# Patient Record
Sex: Male | Born: 1975 | Race: White | Marital: Single | Smoking: Former smoker
Health system: Northeastern US, Academic
[De-identification: ages and names within clinical notes are randomized; demographics above are authoritative.]

## PROBLEM LIST (undated history)

## (undated) DIAGNOSIS — I10 Essential (primary) hypertension: Secondary | ICD-10-CM

## (undated) DIAGNOSIS — E669 Obesity, unspecified: Secondary | ICD-10-CM

## (undated) DIAGNOSIS — F99 Mental disorder, not otherwise specified: Secondary | ICD-10-CM

## (undated) DIAGNOSIS — I2510 Atherosclerotic heart disease of native coronary artery without angina pectoris: Secondary | ICD-10-CM

## (undated) HISTORY — PX: CHOLECYSTECTOMY: SHX55

## (undated) HISTORY — PX: HAND TENDON SURGERY: SHX663

## (undated) HISTORY — PX: OTHER SURGICAL HISTORY: SHX169

---

## 2004-12-13 ENCOUNTER — Emergency Department (HOSPITAL_COMMUNITY): Admission: EM | Admit: 2004-12-13 | Discharge: 2004-12-13 | Payer: Self-pay | Admitting: Emergency Medicine

## 2006-09-22 ENCOUNTER — Encounter: Payer: Self-pay | Admitting: Cardiology

## 2011-10-26 ENCOUNTER — Other Ambulatory Visit: Payer: Self-pay | Admitting: Gastroenterology

## 2011-10-26 ENCOUNTER — Encounter: Payer: Self-pay | Admitting: Emergency Medicine

## 2011-10-26 ENCOUNTER — Inpatient Hospital Stay
Admission: EM | Admit: 2011-10-26 | Disposition: A | Discharge status: Home / Self Care | Payer: Self-pay | Source: Ambulatory Visit | Admitting: Emergency Medicine

## 2011-10-26 HISTORY — DX: Obesity, unspecified: E66.9

## 2011-10-26 HISTORY — DX: Mental disorder, not otherwise specified: F99

## 2011-10-26 HISTORY — DX: Atherosclerotic heart disease of native coronary artery without angina pectoris: I25.10

## 2011-10-26 HISTORY — DX: Essential (primary) hypertension: I10

## 2011-10-26 LAB — HOLD BLUE

## 2011-10-26 LAB — TROPONIN T: Troponin T: <0.01 ng/mL (ref 0.00–0.02)

## 2011-10-26 LAB — CK ISOENZYMES
CK: 509 U/L — ABNORMAL HIGH (ref 46–171)
Mass CKMB: 9 ng/mL — ABNORMAL HIGH (ref 0.0–4.9)
Relative Index: 1.8 % (ref 0.0–5.0)

## 2011-10-26 LAB — CBC AND DIFFERENTIAL
Baso # K/uL: 0 10*3/uL (ref 0.0–0.1)
Basophil %: 0.2 % (ref 0.2–1.2)
Eos # K/uL: 0 10*3/uL (ref 0.0–0.5)
Eosinophil %: 0.1 % — ABNORMAL LOW (ref 0.8–7.0)
Hematocrit: 47 % (ref 40–51)
Hemoglobin: 17.1 g/dL (ref 13.7–17.5)
Lymph # K/uL: 1.7 10*3/uL (ref 1.3–3.6)
Lymphocyte %: 9.2 % — ABNORMAL LOW (ref 21.8–53.1)
MCV: 87 fL (ref 79–92)
Mono # K/uL: 0.9 10*3/uL — ABNORMAL HIGH (ref 0.3–0.8)
Monocyte %: 4.9 % — ABNORMAL LOW (ref 5.3–12.2)
Neut # K/uL: 16 10*3/uL — ABNORMAL HIGH (ref 1.8–5.4)
Platelets: 205 10*3/uL (ref 150–330)
RBC: 5.4 MIL/uL (ref 4.6–6.1)
RDW: 13.1 % (ref 11.6–14.4)
Seg Neut %: 85.6 % — ABNORMAL HIGH (ref 34.0–67.9)
WBC: 18.7 10*3/uL — ABNORMAL HIGH (ref 4.2–9.1)

## 2011-10-26 LAB — BASIC METABOLIC PANEL
Anion Gap: 17 — ABNORMAL HIGH (ref 7–16)
CO2: 22 mmol/L (ref 20–28)
Calcium: 9.6 mg/dL (ref 9.0–10.3)
Chloride: 100 mmol/L (ref 96–108)
Creatinine: 0.91 mg/dL (ref 0.67–1.17)
GFR,Black: 124 *
GFR,Caucasian: 107 *
Glucose: 105 mg/dL — ABNORMAL HIGH (ref 60–99)
Lab: 16 mg/dL (ref 6–20)
Potassium: 3 mmol/L — ABNORMAL LOW (ref 3.3–5.1)
Sodium: 139 mmol/L (ref 133–145)

## 2011-10-26 LAB — BLOOD BANK HOLD RED

## 2011-10-26 LAB — HOLD GRAY

## 2011-10-26 LAB — BLOOD BANK HOLD LAVENDER

## 2011-10-26 MED ORDER — MORPHINE SULFATE 4 MG/ML IJ SOLN
4.0000 mg | INTRAMUSCULAR | Status: DC | PRN
Start: 2011-10-26 — End: 2011-10-29
  Administered 2011-10-26 – 2011-10-28 (×8): 4 mg via INTRAVENOUS
  Filled 2011-10-26 (×9): qty 1

## 2011-10-26 MED ORDER — POTASSIUM BICARBONATE 25 MEQ PO TBEF *I*
50.0000 meq | EFFERVESCENT_TABLET | Freq: Once | ORAL | Status: AC
Start: 2011-10-27 — End: 2011-10-26
  Administered 2011-10-26: 50 meq via ORAL
  Filled 2011-10-26: qty 2

## 2011-10-26 MED ORDER — ONDANSETRON HCL 2 MG/ML IV SOLN *I*
4.0000 mg | Freq: Four times a day (QID) | INTRAMUSCULAR | Status: AC | PRN
Start: 2011-10-26 — End: 2011-10-27
  Administered 2011-10-26 – 2011-10-27 (×3): 4 mg via INTRAVENOUS
  Filled 2011-10-26 (×3): qty 2

## 2011-10-26 MED ORDER — SODIUM CHLORIDE 0.9 % IV BOLUS *I*
1000.0000 mL | Freq: Once | Status: AC
Start: 2011-10-26 — End: 2011-10-27
  Administered 2011-10-26 – 2011-10-27 (×2): 1000 mL via INTRAVENOUS

## 2011-10-26 MED ORDER — SODIUM CHLORIDE 0.9 % IV SOLN WRAPPED *I*
100.0000 mL/h | Status: DC
Start: 2011-10-26 — End: 2011-10-27
  Administered 2011-10-27 (×6): 100 mL/h via INTRAVENOUS

## 2011-10-26 NOTE — ED Notes (Signed)
NP Weber notified of elevated CK and decreased potassium level. Also notified of pt's request for pain medicine.   August Saucer, RN

## 2011-10-26 NOTE — ED Notes (Signed)
Pt came in via EMS, EMS reports pt having seizure for 2 min (tonic clonic movement, post ictal in ambulance. Pt is currently having chest pain 10/10 in center of chest that feels tight. EMS gave 4 aspirin and zofran prior to arrival.  Pt has hx of MI. Pt denies SOB. Pt is is nauseous and diaphoretic.  Pt reports mouth feeling numb and tingling. EMS believes that during seizure he bit his tongue, head is pounding. Pt reports not taking medication today because he "wasnt feeling well all day" (medication in bag at bedside.)  August Saucer, RN

## 2011-10-26 NOTE — ED Notes (Signed)
NP at bedside  Jetson Pickrel C Fabian Coca, RN

## 2011-10-26 NOTE — ED Notes (Signed)
C/o swollen tongue. Had seizure, may have bit tongue. Now feels short of breath, and chest pain.

## 2011-10-26 NOTE — ED Notes (Signed)
In addition to chest pain, head ache and lower abdominal tenderness, Pt now reports new onset of shooting pain up left leg. Will continue to monitor and await provider orders.  August Saucer, RN

## 2011-10-26 NOTE — ED Notes (Signed)
 Labs reviewed.  August Saucer, RN

## 2011-10-26 NOTE — ED Provider Notes (Addendum)
History     Chief Complaint   Patient presents with   . Seizures   . Chest Pain     HPI Comments: Patient with PMH of CAD/MI, HTN, obesity, and very poor historian, was brought into ED tonight after he got up, went into bathroom, witness states she fell backward, then started to shake all over, then was able to get up, and walk into living room, was complaining of chest pain.  Patient does not have any recall of events since he stood up to go to the bathroom. Patient states he has a headache now, body hurts all over, still having chest pain.  No nausea/vomiting, denies recent illness, no recent fevers.    The history is provided by the patient, a relative and a parent. No language interpreter was used.       Past Medical History   Diagnosis Date   . Coronary artery disease    . Obesity    . Psychiatric disorder    . Hypertension             History reviewed. No pertinent past surgical history.    History reviewed. No pertinent family history.      Social History      reports that he has quit smoking. His smoking use included Cigarettes. He has never used smokeless tobacco. He reports that he does not drink alcohol or use illicit drugs. His sexual activity history not on file.    Living Situation     Questions Responses    Patient lives with Alone    Homeless No    Caregiver for other family member No    External Services None    Employment     Domestic Violence Risk No          Review of Systems   Review of Systems   Constitutional: Positive for activity change. Negative for fever, chills, diaphoresis, appetite change, fatigue and unexpected weight change.        Patient states he did not take his regular medications today because he did not feel well  Headache, body aches, chest pain   HENT: Positive for mouth sores. Negative for hearing loss, ear pain, nosebleeds, congestion, sore throat, facial swelling, rhinorrhea, sneezing, drooling, trouble swallowing, neck pain, neck stiffness, dental problem, voice  change, postnasal drip, sinus pressure, tinnitus and ear discharge.         Reports tongue feels a little sore/ swollen   Eyes: Negative.  Negative for photophobia, pain, discharge, redness, itching and visual disturbance.   Respiratory: Positive for shortness of breath. Negative for cough, choking, chest tightness, wheezing and stridor.    Cardiovascular: Positive for chest pain. Negative for palpitations and leg swelling.        Reports mid-sternal CP   Gastrointestinal: Negative.  Negative for nausea, vomiting, abdominal pain, diarrhea and abdominal distention.   Genitourinary: Negative.  Negative for dysuria, urgency, frequency, hematuria, flank pain, decreased urine volume and difficulty urinating.   Musculoskeletal: Positive for myalgias. Negative for back pain, joint swelling, arthralgias and gait problem.        Generalized body soreness/aches   Skin: Negative.    Neurological: Positive for dizziness, seizures, syncope, light-headedness and headaches. Negative for tremors, facial asymmetry, speech difficulty, weakness and numbness.        Had feeling of dizziness before falling  Niece reports patient shaking all over for about 3 minutes, ambulance report patient was post ictal upon their arrival  Patient does not  recall events excepts dizziness after standing up to go to bathroom   Hematological: Negative.  Negative for adenopathy. Does not bruise/bleed easily.   Psychiatric/Behavioral: Negative.        Physical Exam     ED Triage Vitals   BP Heart Rate Resp Temp Temp Source SpO2 O2 Device O2 Flow Rate weight   10/26/11 2116 10/26/11 2116 10/26/11 2116 10/26/11 2116 10/26/11 2116 10/26/11 2116 10/26/11 2116 -- 10/26/11 2116   141/81 mmHg 113  18  35.8 C (96.4 F) TEMPORAL 94 % None (Room air)  111.131 kg (245 lb)       Physical Exam   Nursing note and vitals reviewed.  Constitutional: He is oriented to person, place, and time. He appears well-developed and well-nourished. No distress.        Poor historian    HENT:   Head: Normocephalic.   Right Ear: External ear normal.   Left Ear: External ear normal.   Nose: Nose normal.        Superficial bite marks on dorsal mid tongue, non-suturable  No other oral trauma  No C-spine palpation tenderness  Full ROM of neck without pain  Mild area of swelling/scalp contusion Left posterior area          Eyes: Conjunctivae and EOM are normal. Pupils are equal, round, and reactive to light. Right eye exhibits no discharge. Left eye exhibits no discharge. No scleral icterus.   Neck: Normal range of motion. Neck supple.   Cardiovascular: Normal rate, regular rhythm, normal heart sounds and intact distal pulses.  Exam reveals no gallop and no friction rub.    No murmur heard.  Pulmonary/Chest: Effort normal. No stridor. No respiratory distress. He has no wheezes. He has no rales. He exhibits tenderness.        Coarse breath sounds, scattered  + palpation tenderness Left chest wall area   Abdominal: Soft. Bowel sounds are normal. He exhibits distension. He exhibits no mass. There is no tenderness. There is no rebound and no guarding.        Very large, rounded, semi-soft, non-tender to palpation  No CVA tenderness   Musculoskeletal: Normal range of motion. He exhibits no edema and no tenderness.        No edema  2+ BP/PT pulses BLE  No calf palpation tenderness BLE  5+ motor all extremities  Normal sensation all extremities   Lymphadenopathy:     He has no cervical adenopathy.   Neurological: He is alert and oriented to person, place, and time. No cranial nerve deficit. Coordination normal.   Skin: Skin is warm and dry. No rash noted. He is not diaphoretic. No erythema. No pallor.   Psychiatric: He has a normal mood and affect. His behavior is normal. Judgment and thought content normal.       Medical Decision Making      Amount and/or Complexity of Data Reviewed  Clinical lab tests: ordered and reviewed  Tests in the radiology section of CPT: reviewed and ordered  Tests in the medicine  section of CPT: ordered and reviewed  Obtain history from someone other than the patient: yes (Family members)  Review and summarize past medical records: yes  Discuss the patient with other providers: yes  Independent visualization of images, tracings, or specimens: yes        Initial Evaluation:  ED First Provider Contact     Date/Time Event User Comments    10/26/11 2253 ED Provider First Contact [place], [facility]  ANN Initial Face to Face Provider Contact          Patient seen by me today 10/26/2011 at 2300    Assessment:  36 y.o., male with PMH of CAD/MI, HTN, GERD comes to the ED with syncope, tonight, seizure activity, now HA, CP, generalized body aches.  Patient states he had not felt well all day today, has not taken his regular medications.    Differential Diagnosis includes ACS, angina, dysrhythmia, electrolyte imbalance, micturition syncope, vagal response, dehydration, infection, musculoskeletal pain    Plan: monitor, serial troponin's, EKG's          CT head          Check labs          Anticipate admission    Supervising physician Dr Jenene Slicker  was immediately available.         MARY ANN Valarie Cones, NP    Sharon Mt, NP  10/27/11 0049    Sharon Mt, NP  10/27/11 0159      APP Review:     I had face-to-face interaction with the patient 10/27/2011 at 0325.      I was asked by Valarie Cones NP/PA to see this patient due to the complexity of the current medical presentation and due to diagnostic uncertainty.    I have reviewed and agree with the above documentation and, in addition, the history is notable for poor po intake over the past few days; syncope vs seizure due to vagal event.         Author Noah Delaine, MD      Noah Delaine, MD  10/28/11 [phone]

## 2011-10-27 ENCOUNTER — Encounter: Payer: Self-pay | Admitting: Emergency Medicine

## 2011-10-27 DIAGNOSIS — I2510 Atherosclerotic heart disease of native coronary artery without angina pectoris: Secondary | ICD-10-CM | POA: Insufficient documentation

## 2011-10-27 DIAGNOSIS — I10 Essential (primary) hypertension: Secondary | ICD-10-CM | POA: Insufficient documentation

## 2011-10-27 DIAGNOSIS — E669 Obesity, unspecified: Secondary | ICD-10-CM | POA: Insufficient documentation

## 2011-10-27 DIAGNOSIS — F99 Mental disorder, not otherwise specified: Secondary | ICD-10-CM | POA: Insufficient documentation

## 2011-10-27 LAB — PLASMA PROF 7 (ED ONLY)
Anion Gap,PL: 10 (ref 7–16)
CO2,Plasma: 28 mmol/L (ref 20–28)
Chloride,Plasma: 100 mmol/L (ref 96–108)
Creatinine: 0.8 mg/dL (ref 0.67–1.17)
GFR,Black: 132 *
GFR,Caucasian: 114 *
Glucose,Plasma: 88 mg/dL (ref 60–99)
Potassium,Plasma: 2.8 mmol/L — CL (ref 3.4–4.7)
Sodium,Plasma: 138 mmol/L (ref 132–146)
UN,Plasma: 10 mg/dL (ref 6–20)

## 2011-10-27 LAB — URINALYSIS WITH MICROSCOPIC
Blood,UA: NEGATIVE
Ketones, UA: NEGATIVE
Leuk Esterase,UA: NEGATIVE
Nitrite,UA: NEGATIVE
Protein,UA: NEGATIVE mg/dL
RBC,UA: NONE SEEN /hpf (ref 0–2)
Specific Gravity,UA: 1.015 (ref 1.002–1.030)
WBC,UA: <1 /hpf (ref 0–5)
pH,UA: 6 (ref 5.0–8.0)

## 2011-10-27 LAB — BASIC METABOLIC PANEL
Anion Gap: 9 (ref 7–16)
CO2: 28 mmol/L (ref 20–28)
Calcium: 8.6 mg/dL — ABNORMAL LOW (ref 9.0–10.3)
Chloride: 102 mmol/L (ref 96–108)
Creatinine: 0.94 mg/dL (ref 0.67–1.17)
GFR,Black: 119 *
GFR,Caucasian: 103 *
Glucose: 98 mg/dL (ref 60–99)
Lab: 17 mg/dL (ref 6–20)
Potassium: 2.9 mmol/L — ABNORMAL LOW (ref 3.3–5.1)
Sodium: 139 mmol/L (ref 133–145)

## 2011-10-27 LAB — CK ISOENZYMES
CK: 1898 U/L — ABNORMAL HIGH (ref 46–171)
CK: 3073 U/L — ABNORMAL HIGH (ref 46–171)
CK: 3285 U/L — ABNORMAL HIGH (ref 46–171)
Mass CKMB: 14.2 ng/mL — ABNORMAL HIGH (ref 0.0–4.9)
Mass CKMB: 12 ng/mL — ABNORMAL HIGH (ref 0.0–4.9)
Mass CKMB: 10 ng/mL — ABNORMAL HIGH (ref 0.0–4.9)
Relative Index: 0.7 % (ref 0.0–5.0)
Relative Index: 0.4 % (ref 0.0–5.0)
Relative Index: 0.3 % (ref 0.0–5.0)

## 2011-10-27 LAB — CBC AND DIFFERENTIAL
Baso # K/uL: 0 10*3/uL (ref 0.0–0.1)
Basophil %: 0.2 % (ref 0.2–1.2)
Eos # K/uL: 0.1 10*3/uL (ref 0.0–0.5)
Eosinophil %: 0.5 % — ABNORMAL LOW (ref 0.8–7.0)
Hematocrit: 40 % (ref 40–51)
Hemoglobin: 14.7 g/dL (ref 13.7–17.5)
Lymph # K/uL: 2.5 10*3/uL (ref 1.3–3.6)
Lymphocyte %: 19.1 % — ABNORMAL LOW (ref 21.8–53.1)
MCV: 87 fL (ref 79–92)
Mono # K/uL: 1 10*3/uL — ABNORMAL HIGH (ref 0.3–0.8)
Monocyte %: 7.5 % (ref 5.3–12.2)
Neut # K/uL: 9.5 10*3/uL — ABNORMAL HIGH (ref 1.8–5.4)
Platelets: 170 10*3/uL (ref 150–330)
RBC: 4.6 MIL/uL (ref 4.6–6.1)
RDW: 13.1 % (ref 11.6–14.4)
Seg Neut %: 72.7 % — ABNORMAL HIGH (ref 34.0–67.9)
WBC: 13 10*3/uL — ABNORMAL HIGH (ref 4.2–9.1)

## 2011-10-27 LAB — TROPONIN T
Troponin T: <0.01 ng/mL (ref 0.00–0.02)
Troponin T: <0.01 ng/mL (ref 0.00–0.02)

## 2011-10-27 LAB — DRUG SCREEN CHEMICAL DEPENDENCY, URINE
Amphetamine,UR: NEGATIVE
Benzodiazepinen,UR: NEGATIVE
Cocaine/Metab,UR: NEGATIVE
Opiates,UR: POSITIVE
THC Metabolite,UR: NEGATIVE

## 2011-10-27 MED ORDER — PANTOPRAZOLE SODIUM 40 MG PO TBEC *I*
40.0000 mg | DELAYED_RELEASE_TABLET | Freq: Two times a day (BID) | ORAL | Status: DC
Start: 2011-10-27 — End: 2011-10-29
  Administered 2011-10-27 – 2011-10-29 (×4): 40 mg via ORAL
  Filled 2011-10-27 (×6): qty 1

## 2011-10-27 MED ORDER — ACETAMINOPHEN 500 MG PO TABS *I*
ORAL_TABLET | ORAL | Status: AC
Start: 2011-10-27 — End: 2011-10-27
  Administered 2011-10-27: 1000 mg via ORAL
  Filled 2011-10-27: qty 2

## 2011-10-27 MED ORDER — KETOROLAC TROMETHAMINE 30 MG/ML IJ SOLN *I*
30.0000 mg | Freq: Once | INTRAMUSCULAR | Status: AC
Start: 2011-10-27 — End: 2011-10-27
  Administered 2011-10-27: 30 mg via INTRAVENOUS
  Filled 2011-10-27: qty 1

## 2011-10-27 MED ORDER — POTASSIUM CHLORIDE CRYS CR 20 MEQ PO TBCR *I*
40.0000 meq | ORAL_TABLET | Freq: Once | ORAL | Status: AC
Start: 2011-10-27 — End: 2011-10-27
  Administered 2011-10-27: 40 meq via ORAL
  Filled 2011-10-27: qty 2

## 2011-10-27 MED ORDER — POTASSIUM BICARBONATE 25 MEQ PO TBEF *I*
50.0000 meq | EFFERVESCENT_TABLET | Freq: Once | ORAL | Status: AC
Start: 2011-10-27 — End: 2011-10-27
  Administered 2011-10-27: 50 meq via ORAL
  Filled 2011-10-27: qty 2

## 2011-10-27 MED ORDER — METOPROLOL SUCCINATE 50 MG PO TB24 *I*
50.0000 mg | ORAL_TABLET | Freq: Every day | ORAL | Status: DC
Start: 2011-10-27 — End: 2011-10-29
  Administered 2011-10-27 – 2011-10-29 (×3): 50 mg via ORAL
  Filled 2011-10-27 (×3): qty 1

## 2011-10-27 MED ORDER — CALCIUM CARBONATE ANTACID 648 MG PO TABS *I*
1300.0000 mg | ORAL_TABLET | Freq: Two times a day (BID) | ORAL | Status: DC
Start: 2011-10-27 — End: 2011-10-29
  Administered 2011-10-27 – 2011-10-29 (×3): 1300 mg via ORAL
  Filled 2011-10-27 (×6): qty 2

## 2011-10-27 MED ORDER — POTASSIUM BICARBONATE 25 MEQ PO TBEF *I*
25.0000 meq | EFFERVESCENT_TABLET | Freq: Once | ORAL | Status: AC
Start: 2011-10-27 — End: 2011-10-27
  Administered 2011-10-27: 25 meq via ORAL
  Filled 2011-10-27: qty 1

## 2011-10-27 MED ORDER — SODIUM CHLORIDE 0.9 % IV SOLN WRAPPED *I*
150.0000 mL/h | Status: DC
Start: 2011-10-27 — End: 2011-10-28
  Administered 2011-10-27 – 2011-10-28 (×5): 150 mL/h via INTRAVENOUS

## 2011-10-27 MED ORDER — CHLORTHALIDONE 50 MG PO TABS *I*
25.0000 mg | ORAL_TABLET | Freq: Every morning | ORAL | Status: DC
Start: 2011-10-28 — End: 2011-10-29
  Administered 2011-10-28 – 2011-10-29 (×2): 25 mg via ORAL
  Filled 2011-10-27 (×3): qty 1

## 2011-10-27 MED ORDER — ACETAMINOPHEN 500 MG PO TABS *I*
1000.0000 mg | ORAL_TABLET | Freq: Once | ORAL | Status: AC
Start: 2011-10-27 — End: 2011-10-27

## 2011-10-27 MED ORDER — SERTRALINE HCL 50 MG PO TABS *I*
200.0000 mg | ORAL_TABLET | Freq: Every day | ORAL | Status: DC
Start: 2011-10-27 — End: 2011-10-29
  Administered 2011-10-27 – 2011-10-29 (×3): 200 mg via ORAL
  Filled 2011-10-27 (×3): qty 4

## 2011-10-27 NOTE — ED Notes (Signed)
Pt resting comfortably, watching tv. Will continue to monitor and tx per orders.

## 2011-10-27 NOTE — ED Notes (Signed)
Labs reviewed. Tilda Franco NP paged about elevated CK. Awaiting providers orders. Will continue to monitor.  August Saucer, RN

## 2011-10-27 NOTE — ED Notes (Signed)
ED RN INTERN ATTESTATION       I Aurelio Brash, RN (RN) reviewed the following charting information by the RN intern: SD    Nursing Assessments  Medications  Plan of Care  Teaching   Notes    In the chart of Robert Valdez (36 y.o. male) and attest to the charting being accurate.

## 2011-10-27 NOTE — ED Notes (Signed)
Pt reports tongue feeling more numb. Will continue to monitor.  August Saucer, RN

## 2011-10-27 NOTE — ED Notes (Addendum)
ED RN INTERN ATTESTATION       I Isaiah Serge, RN (RN) reviewed the following charting information by the RN intern:Tayler Moonan    Nursing Assessments  Medications  Plan of Care  Teaching   Notes    In the chart of Robert Valdez (36 y.o. male) and attest to the charting being accurate.

## 2011-10-27 NOTE — Progress Notes (Signed)
Utilization Management    Level of Care Inpatient as of the date 10/26/11      Dutch Quint, RN     Pager: [phone]

## 2011-10-27 NOTE — ED Notes (Signed)
Tilda Franco NP made aware of CK. Will await provider orders.  August Saucer, RN

## 2011-10-27 NOTE — ED Notes (Signed)
Pt resting comfortably,reports pain has decreased from a 10/10 to 6/10. Will continue to monitor and tx per orders

## 2011-10-27 NOTE — ED Notes (Signed)
Patient is a+ox4 using urinal appropriately and tolerating PO intake. He still has some chest pain that he states comes and goes. He is relaxing in bed watching tv, will continue to monitor.

## 2011-10-27 NOTE — ED Notes (Signed)
Spoke with provider about potassium. Will medicate per orders.

## 2011-10-27 NOTE — ED Notes (Signed)
Labs and chart reviewed, K noted at 2.9, provider advised.

## 2011-10-27 NOTE — ED Notes (Signed)
Labs reviewed. Provider paged about potassium level. Will continue to monitor pt.     August Saucer, RN

## 2011-10-27 NOTE — ED Notes (Signed)
POTASSIUM REPORTED TO BE 2.8 BY CHEMISTRY, REPORTED TO KATIE, RN

## 2011-10-27 NOTE — ED Notes (Signed)
Received report, assumed care of pt. Chart and labs review.

## 2011-10-27 NOTE — ED Notes (Signed)
Pt given pain medicine per request for tongue pain. Pt refused PO potassium supplement at this time due to pain. Weber NP notified of pt's tongue pain due to bruising from bite yesterday. NP aware and pt notified of plan to administer IV potassium if he is not able to drink potassium supplement. Will continue to monitor and offer PO potassium again after reassessing pain medication for effectiveness.   August Saucer, RN

## 2011-10-27 NOTE — ED Notes (Signed)
ED RN INTERN ATTESTATION       I Isaiah Serge, RN (RN) reviewed the following charting information by the RN intern:JL    Nursing Assessments  Medications  Plan of Care  Teaching   Notes    In the chart of Robert Valdez (36 y.o. male) and attest to the charting being accurate.

## 2011-10-27 NOTE — ED Notes (Signed)
Pt off unit to CT scan

## 2011-10-27 NOTE — ED Notes (Signed)
Pt sleeping. RR 20. Will check back and continue to monitor for pain and nausea.  August Saucer, RN

## 2011-10-27 NOTE — ED Notes (Signed)
Provided pt with sandwich, jello and ginger ale. Pt tolerating PO well.

## 2011-10-27 NOTE — ED Notes (Signed)
Pt requesting jello, graham crackers, and giner ale. Tolerating well. Will continue to monitor.

## 2011-10-27 NOTE — ED Notes (Signed)
Observation care initiated

## 2011-10-27 NOTE — ED Obs Notes (Signed)
ED OBSERVATION ADMISSION NOTE    Patient seen by me today, 10/27/2011 at 3:58 PM    Current patient status: Inpatient    History     Chief Complaint   Patient presents with   . Seizures   . Chest Pain     HPI Comments: Robert Valdez is a 36 year old male PMH CAD HTN obesity and unknown psychiatric disorder who is placed in OBS after he presented to the ED c/o a swollen tongue after a presumed seizure. Pt is somewhat of a poor/vague historian, but reports HPI as documented below.     Per pt, went to bathroom ~2000 last night. States after he finished, he turned off the light and opened the door, then fell backwards, hitting his head on the bathroom wall. Pt states he felt dizzy, so he sat on the floor for a minute to "compose" himself, but when he got up he continued to feel dizzy, so he sat down on the couch. Pt reports he has no memory of the events that occurred next.    Per patient's report of sister's account, sister had "like a 5 minute conversation" with patient, and gave him water to drink, which he reportedly did.     Pt reports no memory of this, reports he woke up on the couch with EMS standing all around him.     Upon further questioning, pt endorses lightheaded and dizziness prior to fall. Pt currently c/o pain in stomach, HA, tongue pain and swelling. Pt reports L sided flank and abdominal pain x 2 weeks with associated diarrhea, occasionally blood tinged. Pt reports seeing his doctor for this, states he had a CT scan and bloodwork scheduled for today to r/o diverticulosis. Pt also reports intermittent chestpain x 6 weeks-also being worked up by his doctor.     The history is provided by the patient and medical records. No language interpreter was used.       Past Medical History   Diagnosis Date   . Coronary artery disease    . Obesity    . Psychiatric disorder    . Hypertension        Past Surgical History   Procedure Date   . Cholecystectomy    . Wrist surgery        History reviewed. No pertinent  family history.    Social History      reports that he quit smoking about 2 weeks ago. His smoking use included Cigarettes. He has never used smokeless tobacco. He reports that he does not drink alcohol or use illicit drugs. His sexual activity history not on file.    Living Situation     Questions Responses    Patient lives with Other(comment)    Comment:  sister in law and niece     Homeless No    Caregiver for other family member No    External Services None    Employment Unemployed    Domestic Violence Risk No          Review of Systems   Review of Systems   Constitutional: Positive for activity change and fatigue. Negative for fever, chills and appetite change.   HENT: Positive for mouth sores. Negative for hearing loss, ear pain, sore throat, neck pain and neck stiffness.    Eyes: Negative for pain and redness.   Respiratory: Negative for chest tightness and shortness of breath.    Cardiovascular: Positive for chest pain. Negative for palpitations.   Gastrointestinal:  Positive for nausea, abdominal pain, diarrhea and blood in stool. Negative for vomiting and abdominal distention.   Genitourinary: Positive for flank pain. Negative for dysuria and decreased urine volume.   Musculoskeletal: Positive for myalgias and back pain.   Skin: Negative for color change and wound.   Neurological: Positive for syncope, weakness, light-headedness and headaches.   Hematological: Negative for adenopathy. Does not bruise/bleed easily.   Psychiatric/Behavioral: Negative for behavioral problems and agitation.       Physical Exam   BP 118/61  Pulse 79  Temp(Src) 37 C (98.6 F) (Temporal)  Resp 15  Ht 1.88 m (6\' 2" )  Wt 111.131 kg (245 lb)  BMI 31.46 kg/m2  SpO2 100%    Physical Exam   Constitutional: He is oriented to person, place, and time. He appears well-developed and well-nourished. No distress.   HENT:   Head: Normocephalic and atraumatic.   Right Ear: External ear normal.   Left Ear: External ear normal.   Nose:  Nose normal.        Pt tongue appears discolored with superficial abrasion/bite marks noted   Eyes: Conjunctivae and EOM are normal.   Neck: Normal range of motion.   Cardiovascular: Normal rate, regular rhythm and normal heart sounds.    Pulmonary/Chest: Effort normal and breath sounds normal. No respiratory distress.   Abdominal: Soft. He exhibits no distension.   Musculoskeletal: Normal range of motion.   Neurological: He is alert and oriented to person, place, and time.   Skin: Skin is warm and dry. He is not diaphoretic.   Psychiatric: He has a normal mood and affect. His behavior is normal. Judgment and thought content normal.        Odd affect       Tests    ZOX:WRUE found in chart    Labs:   All labs in the last 24 hours   Recent Results (from the past 24 hour(s))   CBC AND DIFFERENTIAL    Collection Time    10/26/11 10:57 PM       Component Value Range    WBC 18.7 (*) 4.2 - 9.1 THOU/uL    RBC 5.4  4.6 - 6.1 MIL/uL    Hemoglobin 17.1  13.7 - 17.5 g/dL    Hematocrit 47  40 - 51 %    MCV 87  79 - 92 fL    RDW 13.1  11.6 - 14.4 %    Platelets 205  150 - 330 THOU/uL    Seg Neut % 85.6 (*) 34.0 - 67.9 %    Lymphocyte % 9.2 (*) 21.8 - 53.1 %    Monocyte % 4.9 (*) 5.3 - 12.2 %    Eosinophil % 0.1 (*) 0.8 - 7.0 %    Basophil % 0.2  0.2 - 1.2 %    Neut # K/uL 16.0 (*) 1.8 - 5.4 THOU/uL    Lymph # K/uL 1.7  1.3 - 3.6 THOU/uL    Mono # K/uL 0.9 (*) 0.3 - 0.8 THOU/uL    Eos # K/uL 0.0  0.0 - 0.5 THOU/uL    Baso # K/uL 0.0  0.0 - 0.1 THOU/uL   TROPONIN T    Collection Time    10/26/11 10:57 PM       Component Value Range    Troponin T <0.01  0.00 - 0.02 ng/mL   CK ISOENZYMES    Collection Time    10/26/11 10:57 PM       Component  Value Range    CK 509 (*) 46 - 171 U/L    Mass CKMB 9.0 (*) 0.0 - 4.9 ng/mL    Relative Index 1.8  0.0 - 5.0 %   BASIC METABOLIC PANEL    Collection Time    10/26/11 10:57 PM       Component Value Range    Glucose 105 (*) 60 - 99 mg/dL    Sodium 010  272 - 536 mmol/L    Potassium 3.0 (*) 3.3 - 5.1  mmol/L    Chloride 100  96 - 108 mmol/L    CO2 22  20 - 28 mmol/L    Anion Gap 17 (*) 7 - 16    UN 16  6 - 20 mg/dL    Creatinine 6.44  0.34 - 1.17 mg/dL    GFR,Caucasian 742      GFR,Black 124      Calcium 9.6  9.0 - 10.3 mg/dL   HOLD BLUE    Collection Time    10/26/11 10:57 PM       Component Value Range    Hold Blue HOLD TUBE     HOLD GRAY    Collection Time    10/26/11 10:57 PM       Component Value Range    Hold Grey HOLD TUBE     BLOOD BANK HOLD LAVENDER    Collection Time    10/26/11 10:57 PM       Component Value Range    Bld Bank Hld Lav Lav In Bld Bank     BLOOD BANK HOLD RED    Collection Time    10/26/11 10:57 PM       Component Value Range    Bld Bank Hld Red Red In Bld Bank     TROPONIN T    Collection Time    10/27/11  5:34 AM       Component Value Range    Troponin T <0.01  0.00 - 0.02 ng/mL   CK ISOENZYMES    Collection Time    10/27/11  5:34 AM       Component Value Range    CK 1898 (*) 46 - 171 U/L    Mass CKMB 14.2 (*) 0.0 - 4.9 ng/mL    Relative Index 0.7  0.0 - 5.0 %   CBC AND DIFFERENTIAL    Collection Time    10/27/11  5:34 AM       Component Value Range    WBC 13.0 (*) 4.2 - 9.1 THOU/uL    RBC 4.6  4.6 - 6.1 MIL/uL    Hemoglobin 14.7  13.7 - 17.5 g/dL    Hematocrit 40  40 - 51 %    MCV 87  79 - 92 fL    RDW 13.1  11.6 - 14.4 %    Platelets 170  150 - 330 THOU/uL    Seg Neut % 72.7 (*) 34.0 - 67.9 %    Lymphocyte % 19.1 (*) 21.8 - 53.1 %    Monocyte % 7.5  5.3 - 12.2 %    Eosinophil % 0.5 (*) 0.8 - 7.0 %    Basophil % 0.2  0.2 - 1.2 %    Neut # K/uL 9.5 (*) 1.8 - 5.4 THOU/uL    Lymph # K/uL 2.5  1.3 - 3.6 THOU/uL    Mono # K/uL 1.0 (*) 0.3 - 0.8 THOU/uL    Eos # K/uL 0.1  0.0 - 0.5  THOU/uL    Baso # K/uL 0.0  0.0 - 0.1 THOU/uL   BASIC METABOLIC PANEL    Collection Time    10/27/11  5:34 AM       Component Value Range    Glucose 98  60 - 99 mg/dL    Sodium 161  096 - 045 mmol/L    Potassium 2.9 (*) 3.3 - 5.1 mmol/L    Chloride 102  96 - 108 mmol/L    CO2 28  20 - 28 mmol/L    Anion Gap 9  7 - 16    UN 17  6  - 20 mg/dL    Creatinine 4.09  8.11 - 1.17 mg/dL    GFR,Caucasian 914      GFR,Black 119      Calcium 8.6 (*) 9.0 - 10.3 mg/dL   TROPONIN T    Collection Time    10/27/11 11:09 AM       Component Value Range    Troponin T <0.01  0.00 - 0.02 ng/mL   CK ISOENZYMES    Collection Time    10/27/11 11:09 AM       Component Value Range    CK 3073 (*) 46 - 171 U/L    Mass CKMB 12.0 (*) 0.0 - 4.9 ng/mL    Relative Index 0.4  0.0 - 5.0 %       Imaging: Ct Abdomen And Pelvis With Contrast    10/27/2011  IMPRESSION:    1. No evidence of diverticulitis.  2. Fatty liver. 3. Status post cholecystectomy.      Ct Head Without Contrast    10/27/2011  IMPRESSION:     No acute intracranial abnormality. Mild left posterior scalp swelling.   END OF IMPRESSION:     * Portable Chest Standard Ap Single View    10/26/2011  Impression: No acute cardiopulmonary disease.         Medical Decision Making   <EDMDM>    Assessment:  36 y.o., male PMH CAD HTN obesity and unknown psychiatric disorder placed in OBS after evaluation in the ED for chest pain in the setting of possible seizure. Pt is poor historian, apparently had seizure (unclear if new onset-not on meds for seizures) in bathroom and hit head/bit tongue. Complained of chest pain (among multiple other things) while in ED, although now states more chronic in nature and being worked up by PCP. No medical records available in e-record, queried [facility] [place]. Also with reported diarrhea, although none since arrival to ED.     Differential Diagnosis includes ACS, MI, MSK pain, hypoglycemia, drug related seizure, electrolyte imbalance       Plan: Keep in ED under OBS status. Continue telemetry monitoring, recheck CK's/isoenzymes Q6, other labs in AM. Continue IVF, await stool culture if patient stools. Continue home meds. Replete K. Reevaluate after obtaining collateral from family/friends.   Medically preferred DVT prophylaxis: None  Supervising physician Dr. Kennieth Francois was immediately available.      Victoriana Aziz  Carlynn Spry, NP

## 2011-10-27 NOTE — ED Notes (Signed)
Spoke with provider about Trop, CK and EKG orders. Per provider no need to redraw trop at this time due to pt already having 3 done.

## 2011-10-27 NOTE — ED Notes (Signed)
Pt off floor for testing.

## 2011-10-27 NOTE — ED Notes (Signed)
Assumed pt care. Family at bedside.

## 2011-10-27 NOTE — ED Notes (Signed)
Pt c/o nausea after drinking potassium. Pt also states that he did not get any relief from his pain meds. Provider notified.

## 2011-10-27 NOTE — ED Notes (Signed)
ED RN INTERN ATTESTATION       I Lianne Moris, RN (RN) reviewed the following charting information by the RN Intern:  Ladona Ridgel RN  Nursing Assessments  Medications  Plan of Care  Teaching   Notes    In the chart of Robert Valdez (36 y.o. male) and attest to the charting being accurate.

## 2011-10-27 NOTE — ED Notes (Signed)
Pt denies chest pain, SOB and nausea.Pt's only complaint is painful tongue. Will continue to monitor.  August Saucer, RN

## 2011-10-28 LAB — CK ISOENZYMES
CK: 3579 U/L — ABNORMAL HIGH (ref 46–171)
CK: 3077 U/L — ABNORMAL HIGH (ref 46–171)
CK: 1593 U/L — ABNORMAL HIGH (ref 46–171)
CK: 1820 U/L — ABNORMAL HIGH (ref 46–171)
Mass CKMB: 8.3 ng/mL — ABNORMAL HIGH (ref 0.0–4.9)
Mass CKMB: 6.7 ng/mL — ABNORMAL HIGH (ref 0.0–4.9)
Mass CKMB: 3.7 ng/mL (ref 0.0–4.9)
Mass CKMB: 3.7 ng/mL (ref 0.0–4.9)
Relative Index: 0.2 % (ref 0.0–5.0)
Relative Index: 0.2 % (ref 0.0–5.0)
Relative Index: 0.2 % (ref 0.0–5.0)
Relative Index: 0.2 % (ref 0.0–5.0)

## 2011-10-28 LAB — BASIC METABOLIC PANEL
Anion Gap: 8 (ref 7–16)
CO2: 30 mmol/L — ABNORMAL HIGH (ref 20–28)
Calcium: 8.5 mg/dL — ABNORMAL LOW (ref 9.0–10.3)
Chloride: 100 mmol/L (ref 96–108)
Creatinine: 0.83 mg/dL (ref 0.67–1.17)
GFR,Black: 130 *
GFR,Caucasian: 113 *
Glucose: 90 mg/dL (ref 60–99)
Lab: 9 mg/dL (ref 6–20)
Potassium: 3.2 mmol/L — ABNORMAL LOW (ref 3.3–5.1)
Sodium: 138 mmol/L (ref 133–145)

## 2011-10-28 LAB — CBC AND DIFFERENTIAL
Baso # K/uL: 0 10*3/uL (ref 0.0–0.1)
Basophil %: 0.1 % — ABNORMAL LOW (ref 0.2–1.2)
Eos # K/uL: 0.1 10*3/uL (ref 0.0–0.5)
Eosinophil %: 1 % (ref 0.8–7.0)
Hematocrit: 41 % (ref 40–51)
Hemoglobin: 14.2 g/dL (ref 13.7–17.5)
Lymph # K/uL: 1.9 10*3/uL (ref 1.3–3.6)
Lymphocyte %: 16.3 % — ABNORMAL LOW (ref 21.8–53.1)
MCV: 89 fL (ref 79–92)
Mono # K/uL: 0.8 10*3/uL (ref 0.3–0.8)
Monocyte %: 7.3 % (ref 5.3–12.2)
Neut # K/uL: 8.6 10*3/uL — ABNORMAL HIGH (ref 1.8–5.4)
Platelets: 169 10*3/uL (ref 150–330)
RBC: 4.6 MIL/uL (ref 4.6–6.1)
RDW: 13.1 % (ref 11.6–14.4)
Seg Neut %: 75.3 % — ABNORMAL HIGH (ref 34.0–67.9)
WBC: 11.4 10*3/uL — ABNORMAL HIGH (ref 4.2–9.1)

## 2011-10-28 LAB — PLASMA PROF 7 (ED ONLY)
Anion Gap,PL: 11 (ref 7–16)
Anion Gap,PL: 11 (ref 7–16)
Anion Gap,PL: 4 — ABNORMAL LOW (ref 7–16)
Anion Gap,PL: 9 (ref 7–16)
CO2,Plasma: 21 mmol/L (ref 20–28)
CO2,Plasma: 27 mmol/L (ref 20–28)
CO2,Plasma: 29 mmol/L — ABNORMAL HIGH (ref 20–28)
CO2,Plasma: 29 mmol/L — ABNORMAL HIGH (ref 20–28)
Chloride,Plasma: 100 mmol/L (ref 96–108)
Chloride,Plasma: 101 mmol/L (ref 96–108)
Chloride,Plasma: 102 mmol/L (ref 96–108)
Chloride,Plasma: 118 mmol/L — ABNORMAL HIGH (ref 96–108)
Creatinine: 0.58 mg/dL — ABNORMAL LOW (ref 0.67–1.17)
Creatinine: 0.77 mg/dL (ref 0.67–1.17)
Creatinine: 0.78 mg/dL (ref 0.67–1.17)
Creatinine: 0.87 mg/dL (ref 0.67–1.17)
GFR,Black: 128 *
GFR,Black: 133 *
GFR,Black: 134 *
GFR,Black: 151 *
GFR,Caucasian: 110 *
GFR,Caucasian: 115 *
GFR,Caucasian: 116 *
GFR,Caucasian: 130 *
Glucose,Plasma: 66 mg/dL (ref 60–99)
Glucose,Plasma: 83 mg/dL (ref 60–99)
Glucose,Plasma: 86 mg/dL (ref 60–99)
Glucose,Plasma: 95 mg/dL (ref 60–99)
Potassium,Plasma: 2.9 mmol/L — ABNORMAL LOW (ref 3.4–4.7)
Potassium,Plasma: 3.2 mmol/L — ABNORMAL LOW (ref 3.4–4.7)
Potassium,Plasma: 3.3 mmol/L — ABNORMAL LOW (ref 3.4–4.7)
Potassium,Plasma: 7.8 mmol/L (ref 3.4–4.7)
Sodium,Plasma: 139 mmol/L (ref 132–146)
Sodium,Plasma: 140 mmol/L (ref 132–146)
Sodium,Plasma: 140 mmol/L (ref 132–146)
Sodium,Plasma: 143 mmol/L (ref 132–146)
UN,Plasma: 6 mg/dL (ref 6–20)
UN,Plasma: 8 mg/dL (ref 6–20)
UN,Plasma: 8 mg/dL (ref 6–20)
UN,Plasma: 9 mg/dL (ref 6–20)

## 2011-10-28 LAB — MAGNESIUM
Magnesium: 1.8 mEq/L (ref 1.3–2.1)
Magnesium: 1.8 mEq/L (ref 1.3–2.1)

## 2011-10-28 LAB — HOLD GREEN WITH GEL

## 2011-10-28 LAB — POTASSIUM: Potassium: 9 mmol/L (ref 3.3–5.1)

## 2011-10-28 LAB — CONFIRM OPIATES: Confirm Opiates: POSITIVE

## 2011-10-28 MED ORDER — POTASSIUM CHLORIDE IN NACL 20-0.9 MEQ/L-% IV SOLN *I*
125.0000 mL/h | INTRAVENOUS | Status: DC
Start: 2011-10-28 — End: 2011-10-28
  Administered 2011-10-28 (×3): 125 mL/h via INTRAVENOUS

## 2011-10-28 MED ORDER — LIDOCAINE VISCOUS 2 % MT SOLN *I*
10.0000 mL | OROMUCOSAL | Status: DC | PRN
Start: 2011-10-28 — End: 2011-10-29
  Administered 2011-10-28 – 2011-10-29 (×3): 10 mL via OROMUCOSAL
  Filled 2011-10-28 (×2): qty 15

## 2011-10-28 MED ORDER — LORAZEPAM 2 MG/ML IJ SOLN *I*
0.5000 mg | Freq: Four times a day (QID) | INTRAMUSCULAR | Status: DC | PRN
Start: 2011-10-28 — End: 2011-10-29
  Administered 2011-10-29: 0.5 mg via INTRAVENOUS
  Filled 2011-10-28: qty 1

## 2011-10-28 MED ORDER — ENOXAPARIN SODIUM 40 MG/0.4ML IJ SOSY *I*
40.0000 mg | PREFILLED_SYRINGE | INTRAMUSCULAR | Status: DC
Start: 2011-10-28 — End: 2011-10-29
  Administered 2011-10-28 – 2011-10-29 (×2): 40 mg via SUBCUTANEOUS
  Filled 2011-10-28 (×2): qty 0.4

## 2011-10-28 MED ORDER — POTASSIUM CHLORIDE 10 MEQ/50ML IV SOLN *I*
10.0000 meq | INTRAVENOUS | Status: DC
Start: 2011-10-28 — End: 2011-10-28

## 2011-10-28 MED ORDER — LIDOCAINE VISCOUS 2 % MT SOLN *I*
10.0000 mL | Freq: Once | OROMUCOSAL | Status: AC
Start: 2011-10-28 — End: 2011-10-28
  Administered 2011-10-28: 10 mL via OROMUCOSAL
  Filled 2011-10-28 (×4): qty 10

## 2011-10-28 MED ORDER — DIPHENHYDRAMINE-LIDOCAINE-MAALOX (BMX/FIRST MOUTHWASH) *WRAPPED*
30.0000 mL | Freq: Once | ORAL | Status: AC
Start: 2011-10-28 — End: 2011-10-28
  Administered 2011-10-28: 30 mL via OROMUCOSAL
  Filled 2011-10-28: qty 30

## 2011-10-28 MED ORDER — POTASSIUM CHLORIDE 10 MEQ PO TBCR *A*
10.0000 meq | ORAL_TABLET | Freq: Once | ORAL | Status: AC
Start: 2011-10-28 — End: 2011-10-28
  Administered 2011-10-28: 10 meq via ORAL
  Filled 2011-10-28: qty 1

## 2011-10-28 MED ORDER — SODIUM CHLORIDE 0.9 % IV SOLN WRAPPED *I*
200.0000 mL/h | Status: DC
Start: 2011-10-28 — End: 2011-10-28
  Administered 2011-10-28 (×2): 200 mL/h via INTRAVENOUS

## 2011-10-28 MED ORDER — KETOROLAC TROMETHAMINE 30 MG/ML IJ SOLN *I*
30.0000 mg | Freq: Four times a day (QID) | INTRAMUSCULAR | Status: AC | PRN
Start: 2011-10-28 — End: 2011-10-29
  Administered 2011-10-28 – 2011-10-29 (×4): 30 mg via INTRAVENOUS
  Filled 2011-10-28 (×4): qty 1

## 2011-10-28 MED ORDER — POTASSIUM CHLORIDE IN NACL 20-0.9 MEQ/L-% IV SOLN *I*
125.0000 mL/h | INTRAVENOUS | Status: DC
Start: 2011-10-28 — End: 2011-10-29
  Administered 2011-10-28 – 2011-10-29 (×8): 125 mL/h via INTRAVENOUS

## 2011-10-28 MED ORDER — HYDROCODONE-ACETAMINOPHEN 7.5-500 MG/15ML PO SOLN
15.0000 mL | Freq: Four times a day (QID) | ORAL | Status: DC | PRN
Start: 2011-10-28 — End: 2011-10-29
  Administered 2011-10-28 – 2011-10-29 (×2): 15 mL via ORAL
  Filled 2011-10-28 (×2): qty 15

## 2011-10-28 NOTE — ED Notes (Signed)
EEG at bedside.

## 2011-10-28 NOTE — Provider Consult (Addendum)
NEUROLOGY Consult/Admission Note    Referring Provider/Service: Observation unit, Dr. Oneida Alar    Reason for consult: Seizure like activity    History of Present Illness:  [name] is a 36 y/o RH man with PMH of CAD, HTN, obesity and an otherwise not specified psychiatric disorder presented to the ED on 10/26/11 after an episode of altered mental state. He reports that over the last two weeks he has been experiencing symptoms suggestive of a GI bug, with decreased appetite, nausea and bloody diarrhea. Overall, his oral intake was quite diminished and he did not take his prescription medication. On the evening of his presentation he went up from the couch and had an overhwhelming feeling of lightheadedness. He went to the bathroom, emptied his bladder, and lost his balance upon turning around, hitting his occiput on the wall in the process. He did not fall down to the floor. He managed to return to the couch, where he was initially holding a conversation. However, suddenly, his sister, who is a Engineer, site, noticed that his arms and legs became 'very stiff' and he 'became unresponsive'. There was a movement that she described as 'seizure-like' and his eyes were closed during the episode. He had associated tongue biting. She is really uncertain how long the episode lasted, but she would estimate between 3-5 minutes. Subsequently, she was confused and appeared very drowsy. She called EMS who transported him to the ED, where he was initially evaluated for an ACS, given associated chest pain. He was ruled out based on three negative troponins, however, note was made of leukocytosis and increasing levels of CK raising the concern for rhabdomyolysis. A head CT was obtained and was unremarkable for acute changes save for a scalp hematoma. He has never had a similar episode before, there are no obvious seizure risk factors, no anteceding head trauma except for hitting his head on the wall before his arrival, no  history of febrile seizures. He denies recreational drug use. As to our knowledge, there were no recent medication changes.     Past Medical History   Diagnosis Date   . Coronary artery disease    . Obesity    . Psychiatric disorder    . Hypertension      Past Surgical History   Procedure Date   . Cholecystectomy    . Wrist surgery      History reviewed. No pertinent family history.  History     Social History   . Marital Status: Married     Spouse Name: N/A     Number of Children: N/A   . Years of Education: N/A     Social History Main Topics   . Smoking status: Former Smoker     Types: Cigarettes     Quit date: 10/13/2011   . Smokeless tobacco: Never Used   . Alcohol Use: No      none in 3 weeks-previously every weekend   . Drug Use: No   . Sexually Active:      Other Topics Concern   . None     Social History Narrative   . None     No Known Allergies (drug, envir, food or latex)    Prior to Admission Medications:  Prior to Admission medications    Medication Sig Start Date End Date Taking? Authorizing Provider   omeprazole (PRILOSEC) 20 MG capsule Take 20 mg by mouth daily (before breakfast)   Yes [provider]   metoprolol (TOPROL-XL) 50 MG  24 hr tablet Take 50 mg by mouth daily   Do not crush or chew. May be divided.   Yes [provider]   chlorthalidone (HYGROTEN) 25 MG tablet Take 25 mg by mouth every morning   Yes [provider]   sertraline (ZOLOFT) 100 MG tablet Take 200 mg by mouth daily   Yes [provider]     Active Hospital Medications:  Scheduled Meds:     . enoxaparin  40 mg Subcutaneous Q24H   . pantoprazole  40 mg Oral BID AC   . metoprolol  50 mg Oral Daily   . chlorthalidone  25 mg Oral QAM   . sertraline  200 mg Oral Daily   . Calcium Carbonate  1,300 mg Oral Q12H SCH     Continuous Infusions:     . ketorolac     . 0.9% NaCl with KCl 20 mEq 125 mL/hr (10/28/11 1641)   . LORazepam     . morphine Stopped (10/28/11 1400)     PRN Meds:.ketorolac,  HYDROcodone-acetaminophen, LORazepam, morphine    Review of Systems:  CONSTITUTIONAL: Appetite good, no fevers, night sweats or weight loss  EYES: No visual changes, no eye pain  ENT: No hearing difficulties, no ear pain  CV: No chest pain, shortness of breath or peripheral edema  RESPIRATORY: No cough, wheezing or dyspnea  GI: + nausea, -vomiting, +abdominal pain, + change in bowel habits  GU: No dysuria, urgency or incontinence  MS: No joint pain/swelling or musculoskeletal deformities  SKIN: No rashes  PSYCH: No depression or anxiety  ENDOCRINE: No polyuria/polydipsia, no heat intolerance  HEME/LYMPH: No easy bleeding/bruising or swollen nodes    Physical Exam  Temp:  [35.9 C (96.6 F)-37 C (98.6 F)] 36.4 C (97.5 F)  Heart Rate:  [73-85] 73   Resp:  [17-23] 17   BP: (120-133)/(72-82) 120/82 mmHg    Medical Examination:  General: Resting in bed, NAD  Cardiac: S1 S2 RRR, no MRG  Pulmonary: CTA b/l  Musculoskeletal: neck supple    Neurological Examination:  Mental Status: Awake and alert. Oriented to person, place, and time.  Fluent.  Comprehension intact.  Affect appropriate.  Cranial Nerves:       II: Discs sharp, pupils 3/3 to 2/2, fields intact to confrontation.     III/IV/VI: Versions intact without nystagmus, no gaze preference.    V: Facial sensation symmetric to light touch    VII: Facial expression symmetric    VIII: Hearing intact to voice    IX/X: Palate elevates symmetrically    XI: Shoulder shrug symmetric    XII: Tongue midline  Motor: Bulk, tone, and strength were normal throughout. Pronator drift was absent. There were no abnormal movements.  Sensory: Sensation to light touch, temperature, vibration, pinprick, and proprioception were intact.  Romberg was absent.  Coordination: Finger to nose and heel to shin were intact.  Reflexes: 2+ throughout the upper and lower extremities with downgoing toes bilaterally.  Gait: Narrow based and normal. Tandem was intact. Heel-raised and toe-raised were  intact.    Lab Results:  Sodium   Date Value Range Status   10/28/2011 138  133 - 145 mmol/L Final        Sodium,Plasma   Date Value Range Status   10/28/2011 143  132 - 146 mmol/L Final        Potassium   Date Value Range Status   10/28/2011 3.2* 3.3 - 5.1 mmol/L Final  Potassium,Plasma   Date Value Range Status   10/28/2011 7.8* 3.4 - 4.7 mmol/L Final        Chloride   Date Value Range Status   10/28/2011 100  96 - 108 mmol/L Final        Chloride,Plasma   Date Value Range Status   10/28/2011 118* 96 - 108 mmol/L Final        CO2   Date Value Range Status   10/28/2011 30* 20 - 28 mmol/L Final        CO2,Plasma   Date Value Range Status   10/28/2011 21  20 - 28 mmol/L Final        Glucose   Date Value Range Status   10/28/2011 90  60 - 99 mg/dL Final      Reference Ranges apply only to FASTING samples.            ADA Guidelines Blood Sugar Levels for Diagnosing Diabetes & Pre-diabetes      Normal: < 100 mg/dL      Impaired Fasting Glucose (IFG): 100-125 mg/dL      Diabetes:  > 161 mg/dL on two different occasions        Glucose,Plasma   Date Value Range Status   10/28/2011 66  60 - 99 mg/dL Final      Reference Ranges apply only to FASTING samples.        Glucose,UA   Date Value Range Status   10/27/2011 NORM   Final      NORMAL        UN   Date Value Range Status   10/28/2011 9  6 - 20 mg/dL Final        UN,Plasma   Date Value Range Status   10/28/2011 6  6 - 20 mg/dL Final        Calcium   Date Value Range Status   10/28/2011 8.5* 9.0 - 10.3 mg/dL Final        Creatinine   Date Value Range Status   10/28/2011 0.83  0.67 - 1.17 mg/dL Final        Creatinine,Plasma   Date Value Range Status   10/28/2011 0.58* 0.67 - 1.17 mg/dL Final     Lab Results   Component Value Date    WBC 11.4* 10/28/2011    HGB 14.2 10/28/2011    HCT 41 10/28/2011    MCV 89 10/28/2011    PLT 169 10/28/2011     Imaging:  CT Abdomen And Pelvis With Contrast 10/28/2011: 1. No evidence of diverticulitis.  2. Fatty liver. 3. Status post cholecystectomy.  4. Complex cyst in the left  kidney. Ultrasound can be done for  further evaluation    CT Head Without Contrast 10/27/2011: No acute intracranial abnormality. Mild left posterior scalp swelling.    Portable Chest Standard Ap Single View 10/26/2011: No acute cardiopulmonary disease.     Assessment:   [name] is a 36 year old right handed man with a history of coronary artery disease and a psychiatric disorder NOS who presented to the ED 2 days ago after an episode of altered mental state in a setting of two weeks of gastrointestinal distress associated with diminished peroral fluid and food intake and profuse diarrhea. The event described raises the suspicion for a seizure and/or a syncopal spell. The latter would be supported by a preceding felling of lightheadedness. I have not identified any obvious risk factors for a seizure disorder, particularly no history  of febrile seizures, no history of severe head trauma w/ associated loss of consciousness, no recent medication changes and no prescription medication known to lower the seizure threshold. However, there are several historical elements not typically seen with a seizure: eyes were closed, movements may have not been rhythmic. Having said that, there was associated confusion and lethargy. Review of his labs demonstrates a markedly increased CK-MB, higher than levels we typically see after a seizure. His leukocytosis is also much more pronounced than expected after a seizure. His urine toxicology was positive for opiates, his ETOH level was not checked.   Retrospectively, it is impossible to determine the etiology of his spell. We have reviewed his EEG from today and have not found evidence of epileptiform abnormalities. Thus, a primary seizure disorder appears less likely. If this had been a seizure, we would classify it as 'provoked' and therefore not start anticonvulsant treatment. At this point we will establish a longitudinal follow-up and will see him back in our General Neurology  clinic in 4-8 weeks. If he had a similar event, we would repeat an EEG and likely re-consider anti-convulsant medication.     The patient was seen and discussed with attending physician Dr. Michaell Cowing.     Author: Jana Half MD/PhD as of: 10/28/2011  at: 6:03 PM    I saw and evaluated the patient. I agree with Dr. Myrtie Cruise findings and plan of care as documented above.    As discussed above, there are elements suggestive of syncope (with a possible syncopal seizure), such as lightheadedness upon arising and especially after voiding, with risk factors of decreased po intake, diarrhea, and with low BP on evaluation here (for someone with hx of HTN).  An independent seizure appears less likely by history (eyes closed, uncertain nature of movements) and based on the absence in the EEG of discharges (though this is not definitive).  Even if this were a seizure, however, it would need to be considered provoked (as outlined, including positive tox screen), and we have no other evidence to suggest the need for treatment at this time.    Follow in Neurology is recommended, with consideration of a repeat EEG, sleep-deprived, so that both waking and sleep records can be obtained.    Patrick North, MD,PHD

## 2011-10-28 NOTE — ED Notes (Signed)
Pt asleep with telemetry monitoring on. Will continue to monitor.

## 2011-10-28 NOTE — ED Notes (Signed)
Writer assumed patient care.

## 2011-10-28 NOTE — ED Notes (Signed)
Pt going to xray  

## 2011-10-28 NOTE — ED Notes (Signed)
Patient tolerating Ginger ale and pudding. Attempted to turn of suction, patient placed suction in mouth. Suction remains on.

## 2011-10-28 NOTE — ED Notes (Signed)
ED RN INTERN ATTESTATION       I Aurelio Brash, RN (RN) reviewed the following charting information by the RN intern: SD    Nursing Assessments  Medications  Plan of Care  Teaching   Notes    In the chart of Robert Valdez (36 y.o. male) and attest to the charting being accurate.

## 2011-10-28 NOTE — ED Notes (Signed)
Provider to be made aware of pt's lab results

## 2011-10-28 NOTE — ED Obs Notes (Addendum)
ED OBSERVATION PROGRESS NOTE    Patient seen by me today, 10/28/2011 at 8:13 AM    Current patient status: Observation    Chief Complaint:   Chief Complaint   Patient presents with   . Seizures   . Chest Pain       Subjective:  Patient states that he continues to feel "achey all over" and his tongue is very uncomfortable from the biting injury. Patient denies any headache, nausea or seizure activity overnight.    Nursing Pain Score:  Last Nursing documented pain:  0-10 Scale: 10 (pt reports no change, offered comfort measure (blanket, ice )) (10/27/11 2254)      Vitals: Patient Vitals for the past 24 hrs:   BP Temp Temp src Pulse Resp SpO2   10/28/11 0557 133/73 mmHg - - 80  22  92 %   10/28/11 0142 131/75 mmHg - - 85  23  96 %   10/27/11 2218 121/72 mmHg 35.9 C (96.6 F) TEMPORAL 78  19  92 %   10/27/11 1852 - 36.9 C (98.4 F) TEMPORAL 76  23  95 %   10/27/11 1620 120/66 mmHg - - 75  - -   10/27/11 1550 111/61 mmHg 37.1 C (98.8 F) TEMPORAL 80  16  100 %   10/27/11 1200 118/61 mmHg 37 C (98.6 F) TEMPORAL 79  15  100 %   10/27/11 0839 115/76 mmHg 37.1 C (98.8 F) TEMPORAL 89  17  98 %   10/27/11 0812 108/56 mmHg - - 89  20  98 %       Physical Examination:  Physical Exam   Nursing note and vitals reviewed.  Constitutional: He is oriented to person, place, and time. He appears well-developed and well-nourished. No distress.   HENT:   Head: Normocephalic and atraumatic. No trismus in the jaw.   Mouth/Throat: Uvula is midline, oropharynx is clear and moist and mucous membranes are normal. No oral lesions. Normal dentition. No dental abscesses, uvula swelling, lacerations or dental caries. No oropharyngeal exudate, posterior oropharyngeal edema, posterior oropharyngeal erythema or tonsillar abscesses.            Patient with observed large swollen tongue. Ecchymosis as noted on image. Able to stick tongue out and no post oropharyngeal swelling, erythema or uvula swelling. No stridor on auscultation.   Eyes:  Conjunctivae and EOM are normal. Pupils are equal, round, and reactive to light. Right eye exhibits no discharge. Left eye exhibits no discharge.   Neck: Normal range of motion. Neck supple.   Cardiovascular: Normal rate, regular rhythm, S1 normal, S2 normal, normal heart sounds and intact distal pulses.  Exam reveals no friction rub.    No murmur heard.  Pulses:       Radial pulses are 2+ on the right side, and 2+ on the left side.        Dorsalis pedis pulses are 2+ on the right side, and 2+ on the left side.        Posterior tibial pulses are 2+ on the right side, and 2+ on the left side.   Pulmonary/Chest: Effort normal. No accessory muscle usage. No respiratory distress. He has decreased breath sounds in the right lower field and the left lower field. He has no wheezes. He has no rhonchi. He has no rales. He exhibits no tenderness, no crepitus and no swelling.        Coarse breath sounds bilaterally. No wheezing, rhonchi or crackles on auscultation.  Abdominal: Soft. Bowel sounds are normal. He exhibits no distension. There is no tenderness. There is no rebound and no guarding.   Musculoskeletal: Normal range of motion. He exhibits no edema and no tenderness.   Neurological: He is alert and oriented to person, place, and time. He has normal strength. He displays no atrophy and no tremor. No cranial nerve deficit or sensory deficit. He exhibits normal muscle tone. He displays no seizure activity. GCS eye subscore is 4. GCS verbal subscore is 5. GCS motor subscore is 6.   Skin: Skin is warm and dry. No rash noted. He is not diaphoretic. No erythema. No pallor.   Psychiatric: He has a normal mood and affect. His speech is normal. Judgment and thought content normal. He is slowed. Cognition and memory are normal.        Patient has no memory of events after returning from bathroom to the couch.        Lab Results:   All labs in the last 72 hours   Recent Results (from the past 72 hour(s))   CBC AND DIFFERENTIAL     Collection Time    10/26/11 10:57 PM       Component Value Range    WBC 18.7 (*) 4.2 - 9.1 THOU/uL    RBC 5.4  4.6 - 6.1 MIL/uL    Hemoglobin 17.1  13.7 - 17.5 g/dL    Hematocrit 47  40 - 51 %    MCV 87  79 - 92 fL    RDW 13.1  11.6 - 14.4 %    Platelets 205  150 - 330 THOU/uL    Seg Neut % 85.6 (*) 34.0 - 67.9 %    Lymphocyte % 9.2 (*) 21.8 - 53.1 %    Monocyte % 4.9 (*) 5.3 - 12.2 %    Eosinophil % 0.1 (*) 0.8 - 7.0 %    Basophil % 0.2  0.2 - 1.2 %    Neut # K/uL 16.0 (*) 1.8 - 5.4 THOU/uL    Lymph # K/uL 1.7  1.3 - 3.6 THOU/uL    Mono # K/uL 0.9 (*) 0.3 - 0.8 THOU/uL    Eos # K/uL 0.0  0.0 - 0.5 THOU/uL    Baso # K/uL 0.0  0.0 - 0.1 THOU/uL   TROPONIN T    Collection Time    10/26/11 10:57 PM       Component Value Range    Troponin T <0.01  0.00 - 0.02 ng/mL   CK ISOENZYMES    Collection Time    10/26/11 10:57 PM       Component Value Range    CK 509 (*) 46 - 171 U/L    Mass CKMB 9.0 (*) 0.0 - 4.9 ng/mL    Relative Index 1.8  0.0 - 5.0 %   BASIC METABOLIC PANEL    Collection Time    10/26/11 10:57 PM       Component Value Range    Glucose 105 (*) 60 - 99 mg/dL    Sodium 962  952 - 841 mmol/L    Potassium 3.0 (*) 3.3 - 5.1 mmol/L    Chloride 100  96 - 108 mmol/L    CO2 22  20 - 28 mmol/L    Anion Gap 17 (*) 7 - 16    UN 16  6 - 20 mg/dL    Creatinine 3.24  4.01 - 1.17 mg/dL    GFR,Caucasian 027  GFR,Black 124      Calcium 9.6  9.0 - 10.3 mg/dL   HOLD BLUE    Collection Time    10/26/11 10:57 PM       Component Value Range    Hold Blue HOLD TUBE     HOLD GRAY    Collection Time    10/26/11 10:57 PM       Component Value Range    Hold Grey HOLD TUBE     BLOOD BANK HOLD LAVENDER    Collection Time    10/26/11 10:57 PM       Component Value Range    Bld Bank Hld Lav Lav In Bld Bank     BLOOD BANK HOLD RED    Collection Time    10/26/11 10:57 PM       Component Value Range    Bld Bank Hld Red Red In Bld Bank     TROPONIN T    Collection Time    10/27/11  5:34 AM       Component Value Range    Troponin T <0.01  0.00 - 0.02 ng/mL   CK  ISOENZYMES    Collection Time    10/27/11  5:34 AM       Component Value Range    CK 1898 (*) 46 - 171 U/L    Mass CKMB 14.2 (*) 0.0 - 4.9 ng/mL    Relative Index 0.7  0.0 - 5.0 %   CBC AND DIFFERENTIAL    Collection Time    10/27/11  5:34 AM       Component Value Range    WBC 13.0 (*) 4.2 - 9.1 THOU/uL    RBC 4.6  4.6 - 6.1 MIL/uL    Hemoglobin 14.7  13.7 - 17.5 g/dL    Hematocrit 40  40 - 51 %    MCV 87  79 - 92 fL    RDW 13.1  11.6 - 14.4 %    Platelets 170  150 - 330 THOU/uL    Seg Neut % 72.7 (*) 34.0 - 67.9 %    Lymphocyte % 19.1 (*) 21.8 - 53.1 %    Monocyte % 7.5  5.3 - 12.2 %    Eosinophil % 0.5 (*) 0.8 - 7.0 %    Basophil % 0.2  0.2 - 1.2 %    Neut # K/uL 9.5 (*) 1.8 - 5.4 THOU/uL    Lymph # K/uL 2.5  1.3 - 3.6 THOU/uL    Mono # K/uL 1.0 (*) 0.3 - 0.8 THOU/uL    Eos # K/uL 0.1  0.0 - 0.5 THOU/uL    Baso # K/uL 0.0  0.0 - 0.1 THOU/uL   BASIC METABOLIC PANEL    Collection Time    10/27/11  5:34 AM       Component Value Range    Glucose 98  60 - 99 mg/dL    Sodium 454  098 - 119 mmol/L    Potassium 2.9 (*) 3.3 - 5.1 mmol/L    Chloride 102  96 - 108 mmol/L    CO2 28  20 - 28 mmol/L    Anion Gap 9  7 - 16    UN 17  6 - 20 mg/dL    Creatinine 1.47  8.29 - 1.17 mg/dL    GFR,Caucasian 562      GFR,Black 119      Calcium 8.6 (*) 9.0 - 10.3 mg/dL   TROPONIN T  Collection Time    10/27/11 11:09 AM       Component Value Range    Troponin T <0.01  0.00 - 0.02 ng/mL   CK ISOENZYMES    Collection Time    10/27/11 11:09 AM       Component Value Range    CK 3073 (*) 46 - 171 U/L    Mass CKMB 12.0 (*) 0.0 - 4.9 ng/mL    Relative Index 0.4  0.0 - 5.0 %   CK ISOENZYMES    Collection Time    10/27/11  4:57 PM       Component Value Range    CK 3285 (*) 46 - 171 U/L    Mass CKMB 10.0 (*) 0.0 - 4.9 ng/mL    Relative Index 0.3  0.0 - 5.0 %   PLASMA PROF [facility] ED ONLY)    Collection Time    10/27/11  6:38 PM       Component Value Range    Chloride,Plasma 100  96 - 108 mmol/L    CO2,Plasma 28  20 - 28 mmol/L    Potassium,Plasma 2.8 (*) 3.4 -  4.7 mmol/L    Sodium,Plasma 138  132 - 146 mmol/L    Anion Gap 10  7 - 16    UN,Plasma 10  6 - 20 mg/dL    Creatinine,Plasma 4.54  0.67 - 1.17 mg/dL    GFR,Caucasian 098      GFR,Black 132      Glucose,Plasma 88  60 - 99 mg/dL   URINALYSIS WITH MICROSCOPIC    Collection Time    10/27/11  6:39 PM       Component Value Range    Color, UA Yellow  Yellow    Appearance,UR Clear  Clear    Specific Gravity,UA 1.015  1.002 - 1.030    Leuk Esterase,UA NEG  NEGATIVE    Nitrite,UA NEG  NEGATIVE    pH,UA 6.0  5.0 - 8.0    Protein,UA NEG  NEGATIVE mg/dL    Glucose,UA NORM      Ketones, UA NEG  NEGATIVE    Blood,UA NEG  NEGATIVE    RBC,UA None Seen  0 - 2 /hpf    WBC,UA <1  0 - 5 /hpf    Squam Epithel,UA 1+  0-1+    Mucus,UA Present     DRUG SCREEN CHEMICAL DEPENDENCY, URINE    Collection Time    10/27/11  6:39 PM       Component Value Range    Amphetamine,UR NEG      Cocaine/Metab,UR NEG      Benzodiazepinen,UR NEG      Opiates,UR POS      THC Metabolite,UR NEG          *Note: This is not all of the results for the requested time period. They were limited due to a high amount of results. Please view the rest in Results Review.     Imaging findings:     Ct Abdomen And Pelvis With Contrast    10/27/2011       * * P R E L I M I N A R Y  R E P O R T * *  Exam Site: [facility]  Oct 27, 2011 10:21:00 AM    ABDOMEN AND PELVIS CT   CLINICAL INFORMATION: Left lower abdominal pain, nausea vomiting  diarrhea assess for diverticulitis   COMPARISON EXAMS: None.Marland Kitchen   PROCEDURE:  Contiguous axial images are obtained from the diaphragm  through the ischial tuberosities following intravenous administration  of  143cc of Optiray 350. Oral contrast was administered prior to the  study.    ABDOMEN FINDINGS:   Chest Base: Mild bibasilar atelectasis.   Liver/Biliary Tract: Diffuse fatty infiltration of the liver. Status  post cholecystectomy.   Pancreas: Normal.    Spleen: Normal.   Adrenals: Normal.   Kidneys and Collecting  Systems: 1 cm hypodense lesion in the left  kidney on image [id number] may represent a complex cyst. Ultrasound can be  done for further evaluation. Small hypodensity in the upper pole of  the left kidney is too small to characterize.    Lymph Nodes: No lymphadenopathy   Vessels: Unremarkable.   Abdominal GI Tract/Mesentery and Peritoneal Cavity: No evidence of  diverticulitis. No evidence of bowel obstruction. No free fluid. No  free air.    Soft Tissues/Musculoskeletal: No acute osseous or soft tissue  abnormalities.    PELVIS FINDINGS:   Visualized Reproductive Organs: Unremarkable.    Bladder: Normal.   Lymph Nodes:  No lymphadenopathy.    Vessels: Unremarkable.   Pelvic GI Tract/Mesentery and Peritoneal Cavity: No evidence of  diverticulitis. No evidence of bowel obstruction. Normal appendix is  visualized.    Soft Tissues/Musculoskeletal: No acute osseous or soft tissue  abnormality.            * * P R E L I M I N A R Y  R E P O R T * *   10/27/2011  IMPRESSION:    1. No evidence of diverticulitis.  2. Fatty liver. 3. Status post cholecystectomy.      Ct Head Without Contrast  10/27/2011  Exam Site: [facility]  Oct 27, 2011 1:01:00 AM   HEAD CT WITHOUT CONTRAST   CLINICAL INFORMATION: Seizure. Fall.    COMPARISON: No prior   HEAD CT PROCEDURE (CTN10):  Contiguous axial tomographic sections  were obtained from the base to the vertex without intravenous  contrast.   HEAD CT FINDINGS:  The ventricles and subarachnoid spaces are normal  in size and appearance.  No parenchymal, intra-, or extra-axial  abnormalities are identified. There is no hemorrhage or acute  infarction identified. No significant osseous abnormalities are seen.   There is mild left posterior scalp swelling. There is mild mucosal  thickening of the ethmoid sinuses. The remaining paranasal sinuses  and mastoid air cells are adequately aerated.      10/27/2011  IMPRESSION:     No acute intracranial abnormality. Mild left  posterior scalp swelling.   END OF IMPRESSION:     * Portable Chest Standard Ap Single View  10/26/2011  Exam Site: [facility]  Chest Radiograph: Single Portable View of the Chest 10/26/2011   Indication: Chest pain   Comparison: None   Lungs: No focal airspace interstitial disease. No pleural effusion or  pneumothorax is noted.   Heart and Mediastinum: Normal in size   Bones and Soft Tissues: Osseous structures are intact Tubes and Lines: None     10/26/2011  Impression: No acute cardiopulmonary disease.     Assessment: Patient is a 36yo male with a PMH of CAD (unverified on records reviews at [facility] and [facility]) and HTN who presented to the ED with complaints of chest pain and tongue pain after a episode of LOC, concerning for a sycnopal episode versus a seizure. Patient is without seizure  history. Patient denies substance abuse or excessive ETOH use (binge drinking on weekend).     Plan:   1) Chest pain: patient was evaluated with serial trop and EKG which were normal and non-concerning for cardiac involvement. Will continue to observe on telemetry for arrhythmias.  2) Loss of Consciousness: After reviewing story with patient, more concerning for seizure at this time. Patient is a poor historian and difficult to follow on discussion. Patient states that he went to the bathroom and felt dizzy and then made his way back to the couch and had a LOC. Patient's family reports that his body was shaking at that time. Patient recalls nothing until EMS arrived and notes fatigue. Patient came to ED with CK 509, which has since increased to 3579 this am despite IVF. Concern for rhabdomyolosis at this time. Patient also notes to have hypokalemia with K+2.8 on arrival, now up to 3.3 with KCl replacement. Soft tissue xray of neck ordered to evaluate for obstructive process. Neurology consulted this am for evaluation. Patient will require admission to hospital at this time for further treatment of his  rhabdomyolosis.  3) HTN: Continue metroprolol as previously prescribed.  4) GERD: Continue home regimen of omeprazole.  5) Unknown psychiatric disorder: Continue home regimen of sertraline.    Will continue to follow and admit patient as appropriate after neurology input.    Medically preferred DVT prophylaxis: LMWH    Author: Zack Seal, NP  Note created: 10/28/2011  at: 8:04 AM    Addendum:  Patient was seen by Trudie Buckler and the author in the ED holding for OBS placement with prolonged loss of consciousness and post spell confusion. This never happened before. He denied any childhood seizure. Apparently he went to bath room, felt dizziness and went back lie on the couch and next he woke up 3 hours later, tongue swollen and painful, and took about 30 minutes to think straight. In ED CK went up to 3000's from 500 after 33 hours. During my interview he is alert, oriented to [facility], 2013 and April, he follow commands and answer questions appropriately. His cranial nerves intact, but tongue midline, swollen and ecchymotic under surface, no active bleeding. His motor examination is non-focal and sensory intact. Bilateral upgoing toes. Sitting gait normal. No nystagmus.   Discuss case with Neurology non-stroke consult, they will see the patient today.   Continue IV fluid for Rhabdomyolysis, seizure precaution.

## 2011-10-28 NOTE — Student Note (Deleted)
NEUROLOGY Consult Note    Reason for consult: Presumed seizure assessment.    History of Present Illness:  Robert Valdez is a 36 y/o man with PMH of CAD, HTN, obesity and some psychiatric disorder who was brought to ED by EMS on 10/26/2011 after presumed seizure. Patient has been feeling abdominal pain and decreased appetite for the past two weeks, and also states to have diarrhea and large amount of bloody stool.     According to the patient, around 20:00 10/26/2011, he went for bathroom and felt light-headedness after standing up from couch. When he finished voiding, turned off the light and opened the door, he fell backward, hitting his head on the bathroom wall. He sat on the bathroom floor to "hold" himself for a while and eventually managed to get to the couch with persistent light-headedness, and he has no memory of anything happened afterward.     His sister, a Engineer, site, states that she had a 5-minute conversation with him and handed him a glass of water after he sat down. After that, she noticed that the patient is having a seizure-like movement with "locked up" bilateral upper arms and generalized stiffness and became unresponsive. Some jerky movements of both arms also were also present throughout the episode, but whether the jerky movement was rhythmic or not is unclear. Patient's eyes were closed and he was biting his tongue during the seizure. The whole episode lasted for about 5 minutes, and patient regained conscouscess in front of EMS with severe headache and confusion.     Upon arrival to ED, he had stable vital signs and was noted to have elevated WBC to 18.7 and CK level of 507 (increased to 3772 the next day). Hypokalemia (2.9) was also noted, and he was started on potassium chloride. Head and abdominal CT were done and did not reveal any abnormality. Neurology are consulted for seizure assessment. Patient was alert and oriented during interview and examination with attending and neurology  team.     Past Medical History   Diagnosis Date   . Coronary artery disease    . Obesity    . Psychiatric disorder    . Hypertension      Past Surgical History   Procedure Date   . Cholecystectomy    . Wrist surgery      History reviewed. No pertinent family history.  History     Social History   . Marital Status: Married     Spouse Name: N/A     Number of Children: N/A   . Years of Education: N/A     Social History Main Topics   . Smoking status: Former Smoker     Types: Cigarettes     Quit date: 10/13/2011   . Smokeless tobacco: Never Used   . Alcohol Use: No      none in 3 weeks-previously every weekend   . Drug Use: No   . Sexually Active:      Other Topics Concern   . None     Social History Narrative   . None       Allergies: No Known Allergies (drug, envir, food or latex)    Prior to Admission Medications:    (Not in a hospital admission)    Active Hospital Medications:  Current Facility-Administered Medications   Medication Dose Route Frequency   . 0.9% NaCl with KCl 20 mEq per liter  125 mL/hr Intravenous Continuous   . ketorolac (TORADOL) injection 30 mg  30 mg Intravenous Q6H PRN   . HYDROcodone-acetaminophen (LORTAB) 7.5-500 MG/15ML solution 15 mL  15 mL Oral Q6H PRN   . enoxaparin (LOVENOX) injection 40 mg  40 mg Subcutaneous Q24H   . pantoprazole (PROTONIX) EC tablet 40 mg  40 mg Oral BID AC   . metoprolol (TOPROL-XL) 24 hr tablet 50 mg  50 mg Oral Daily   . chlorthalidone (HYGROTEN) tablet 25 mg  25 mg Oral QAM   . sertraline (ZOLOFT) tablet 200 mg  200 mg Oral Daily   . Calcium Carbonate 650 MG tablet TABS 1,300 mg  1,300 mg Oral Q12H SCH   . sodium chloride IV  150 mL/hr Intravenous Continuous   . morphine 4 MG/ML injection 4 mg  4 mg Intravenous Q3H PRN     Current Outpatient Prescriptions   Medication   . omeprazole (PRILOSEC) 20 MG capsule   . metoprolol (TOPROL-XL) 50 MG 24 hr tablet   . chlorthalidone (HYGROTEN) 25 MG tablet   . sertraline (ZOLOFT) 100 MG tablet     Recent Results (from the  past 24 hour(s))   TROPONIN T    Collection Time    10/27/11 11:09 AM       Component Value Range    Troponin T <0.01  0.00 - 0.02 ng/mL   CK ISOENZYMES    Collection Time    10/27/11 11:09 AM       Component Value Range    CK 3073 (*) 46 - 171 U/L    Mass CKMB 12.0 (*) 0.0 - 4.9 ng/mL    Relative Index 0.4  0.0 - 5.0 %   CK ISOENZYMES    Collection Time    10/27/11  4:57 PM       Component Value Range    CK 3285 (*) 46 - 171 U/L    Mass CKMB 10.0 (*) 0.0 - 4.9 ng/mL    Relative Index 0.3  0.0 - 5.0 %   PLASMA PROF 7 [facility] ED ONLY)    Collection Time    10/27/11  6:38 PM       Component Value Range    Chloride,Plasma 100  96 - 108 mmol/L    CO2,Plasma 28  20 - 28 mmol/L    Potassium,Plasma 2.8 (*) 3.4 - 4.7 mmol/L    Sodium,Plasma 138  132 - 146 mmol/L    Anion Gap 10  7 - 16    UN,Plasma 10  6 - 20 mg/dL    Creatinine,Plasma 1.61  0.67 - 1.17 mg/dL    GFR,Caucasian 096      GFR,Black 132      Glucose,Plasma 88  60 - 99 mg/dL   URINALYSIS WITH MICROSCOPIC    Collection Time    10/27/11  6:39 PM       Component Value Range    Color, UA Yellow  Yellow    Appearance,UR Clear  Clear    Specific Gravity,UA 1.015  1.002 - 1.030    Leuk Esterase,UA NEG  NEGATIVE    Nitrite,UA NEG  NEGATIVE    pH,UA 6.0  5.0 - 8.0    Protein,UA NEG  NEGATIVE mg/dL    Glucose,UA NORM      Ketones, UA NEG  NEGATIVE    Blood,UA NEG  NEGATIVE    RBC,UA None Seen  0 - 2 /hpf    WBC,UA <1  0 - 5 /hpf    Squam Epithel,UA 1+  0-1+    Mucus,UA Present  DRUG SCREEN CHEMICAL DEPENDENCY, URINE    Collection Time    10/27/11  6:39 PM       Component Value Range    Amphetamine,UR NEG      Cocaine/Metab,UR NEG      Benzodiazepinen,UR NEG      Opiates,UR POS      THC Metabolite,UR NEG     CK ISOENZYMES    Collection Time    10/27/11 11:38 PM       Component Value Range    CK 3579 (*) 46 - 171 U/L    Mass CKMB 8.3 (*) 0.0 - 4.9 ng/mL    Relative Index 0.2  0.0 - 5.0 %   PLASMA PROF 7 [facility] ED ONLY)    Collection Time    10/27/11 11:38 PM       Component Value Range     Chloride,Plasma 102  96 - 108 mmol/L    CO2,Plasma 29 (*) 20 - 28 mmol/L    Potassium,Plasma 2.9 (*) 3.4 - 4.7 mmol/L    Sodium,Plasma 140  132 - 146 mmol/L    Anion Gap 9  7 - 16    UN,Plasma 8  6 - 20 mg/dL    Creatinine,Plasma 1.61  0.67 - 1.17 mg/dL    GFR,Caucasian 096      GFR,Black 128      Glucose,Plasma 86  60 - 99 mg/dL   MAGNESIUM    Collection Time    10/27/11 11:38 PM       Component Value Range    Magnesium 1.8  1.3 - 2.1 mEq/L   CK ISOENZYMES    Collection Time    10/28/11  6:00 AM       Component Value Range    CK 3077 (*) 46 - 171 U/L    Mass CKMB 6.7 (*) 0.0 - 4.9 ng/mL    Relative Index 0.2  0.0 - 5.0 %   CBC AND DIFFERENTIAL    Collection Time    10/28/11  6:00 AM       Component Value Range    WBC 11.4 (*) 4.2 - 9.1 THOU/uL    RBC 4.6  4.6 - 6.1 MIL/uL    Hemoglobin 14.2  13.7 - 17.5 g/dL    Hematocrit 41  40 - 51 %    MCV 89  79 - 92 fL    RDW 13.1  11.6 - 14.4 %    Platelets 169  150 - 330 THOU/uL    Seg Neut % 75.3 (*) 34.0 - 67.9 %    Lymphocyte % 16.3 (*) 21.8 - 53.1 %    Monocyte % 7.3  5.3 - 12.2 %    Eosinophil % 1.0  0.8 - 7.0 %    Basophil % 0.1 (*) 0.2 - 1.2 %    Neut # K/uL 8.6 (*) 1.8 - 5.4 THOU/uL    Lymph # K/uL 1.9  1.3 - 3.6 THOU/uL    Mono # K/uL 0.8  0.3 - 0.8 THOU/uL    Eos # K/uL 0.1  0.0 - 0.5 THOU/uL    Baso # K/uL 0.0  0.0 - 0.1 THOU/uL   PLASMA PROF 7 [facility] ED ONLY)    Collection Time    10/28/11  6:00 AM       Component Value Range    Chloride,Plasma 101  96 - 108 mmol/L    CO2,Plasma 27  20 - 28 mmol/L    Potassium,Plasma 3.3 (*) 3.4 -  4.7 mmol/L    Sodium,Plasma 139  132 - 146 mmol/L    Anion Gap 11  7 - 16    UN,Plasma 8  6 - 20 mg/dL    Creatinine,Plasma 7.82  0.67 - 1.17 mg/dL    GFR,Caucasian 956      GFR,Black 133      Glucose,Plasma 95  60 - 99 mg/dL   MAGNESIUM    Collection Time    10/28/11  6:00 AM       Component Value Range    Magnesium 1.8  1.3 - 2.1 mEq/L       Review of Systems:  Review of Systems   HENT:        Swollen tongue   Eyes: Negative.    Cardiovascular:  Positive for chest pain.   Gastrointestinal: Positive for nausea, abdominal pain, diarrhea and blood in stool.   Genitourinary: Negative.    Musculoskeletal: Positive for back pain.   Skin: Negative.    Neurological: Positive for dizziness, tingling, seizures, loss of consciousness and headaches.   Endo/Heme/Allergies: Negative.    Psychiatric/Behavioral: Negative.        Last Nursing documented pain:  0-10 Scale: 10 (10/28/11 0914)        Patient Vitals for the past 24 hrs:   BP Temp Temp src Pulse Resp SpO2   10/28/11 0915 123/75 mmHg 37 C (98.6 F) TEMPORAL - 18  95 %   10/28/11 0557 133/73 mmHg - - 80  22  92 %   10/28/11 0142 131/75 mmHg - - 85  23  96 %   10/27/11 2218 121/72 mmHg 35.9 C (96.6 F) TEMPORAL 78  19  92 %   10/27/11 1852 - 36.9 C (98.4 F) TEMPORAL 76  23  95 %   10/27/11 1620 120/66 mmHg - - 75  - -   10/27/11 1550 111/61 mmHg 37.1 C (98.8 F) TEMPORAL 80  16  100 %   10/27/11 1200 118/61 mmHg 37 C (98.6 F) TEMPORAL 79  15  100 %     O2 Device: None (Room air) (10/28/11 0915)    Physical Exam   Constitutional: He is oriented to person, place, and time. He appears well-developed and well-nourished. Sleepy at the end of interview  Head: Normocephalic and atraumatic.   Right Ear: External ear normal.   Left Ear: External ear normal.   Nose: Nose normal.   Patient's tongue appears discolored with superficial abrasion/bite marks noted => probably due to tongue biting   Eyes: Conjunctivae and EOM are normal.   Neck: Normal range of motion.   Cardiovascular: Normal rate, regular rhythm and normal heart sounds.   Pulmonary/Chest: Effort normal and breath sounds normal. No respiratory distress.   Abdominal: Soft. He exhibits no distension.   Musculoskeletal: Normal range of motion.   Neurological: He is alert and oriented to person, place, and time.   Skin: Skin is warm and dry. He is not diaphoretic.   Psychiatric: He has a normal mood and affect. His behavior is normal. Judgment and thought  content normal.    Physical Exam   Constitutional: He is oriented to person, place, and time.   Eyes: EOM are normal. Pupils are equal, round, and reactive to light.   Neurological: He is oriented to person, place, and time. He has a normal Finger-Nose-Finger Test. Gait normal.   Psychiatric: His speech is normal.       Neurological Examination:  Neurologic Exam  Mental Status   Oriented to person, place, and time.       Registration: recalls 3 of 3 objects.   Attention: normal.   Speech: speech is normal   Level of consciousness: alert  Able to name object. Normal comprehension.     Cranial Nerves      CN II   Visual fields full to confrontation.      CN III, IV, VI   Pupils are equal, round, and reactive to light.  Extraocular motions are normal.      CN V   Facial sensation intact.      CN VII   Facial expression full, symmetric.      CN IX, X   CN IX normal.   CN X normal.   Asymmetric palate: hard to see due to swollen tongue.     CN XI   CN XI normal.      CN XII   CN XII normal.        Swollen tongue     Motor Exam   Muscle bulk: normal  Overall muscle tone: normal       MMT: Mild weakness (4+) on left Quadiceps     Sensory Exam   Light touch normal.   Pinprick normal.        Tingling sensation over left thigh, comes and goes     Gait, Coordination, and Reflexes      Gait  Gait: normal     Coordination   Finger to nose coordination: normal      Radiology impressions (last 3 days):  Ct Abdomen And Pelvis With Contrast    10/27/2011  IMPRESSION:    1. No evidence of diverticulitis.  2. Fatty liver. 3. Status post cholecystectomy.      Ct Head Without Contrast    10/27/2011  IMPRESSION:     No acute intracranial abnormality. Mild left posterior scalp swelling.   END OF IMPRESSION:     * Portable Chest Standard Ap Single View    10/26/2011  Impression: No acute cardiopulmonary disease.         Currently Active/Followed Hospital Problems:  There are no hospital problems to display for this patient.      Assessment:    This is a 36 y/o man with PMH of CAD, HTN, obesity and psychiatric disorder who presented to ED with presumed seizure for the first time. The description from patient's sister of tongue biting, bilateral jerky movements, generalized stiffness, loss of memory, relatively long "postictal" confusion and non-localized headache afterward does raise concern of seizure. Elevated WBC and CK after the episode are also compatible with seizure in etiology. Possible provoking factors of seizure include uncontrolled HTN? Diarrhea? Infection? or Drug abuse (positve urine opiate), but It is hard to tell now. On the other hand, his typical pre-syncopal symptoms of light-headedness and GI discomfort can also suggest likelihood of syncope. In either situation, it is not necessary to start him on any AEDs for now unless there is abnormal discharge on formal EEG report. Preliminary EEG study showed normal brain wave with some wicked theta waves, but it's hard to interpret due to persistent background artifacts.     Recommendations:   -- Patient can be discharged from Neurology stand point.  -- May benefit from another EEG study in the future.    Author: Laurice Record  as of: 10/28/2011  at: 9:16 AM

## 2011-10-28 NOTE — ED Notes (Signed)
LAB VALUES K 9.0, PLASMA K 7.8, CL 118 GIVEN TO LINDSAY RN AT 1158---LAB ? CONTAMINATION

## 2011-10-28 NOTE — ED Notes (Signed)
Neuro MD at bedside

## 2011-10-28 NOTE — ED Notes (Signed)
MD at bedside. 

## 2011-10-28 NOTE — Procedures (Addendum)
PROCEDURE: Inpatient Portable EEG    HISTORY:  Robert Valdez  is a 36 year old man with a history of CAD  and HTN who presented to the ED with complaints of chest pain and tongue pain after a episode of loss of consciousness, concerning for a syncopal episode versus a seizure. Patient does not have a history of seizures. This EEG was requested to evaluate for epileptiform abnormalities.    MEDICATIONS:  Zoloft, metoprolol, Lortab    DESCRIPTION:  The recording was performed on an XLTEK Digital EEG machine utilizing at least 21 electrodes measured and applied according to the International 10-20 system.  The duration of the study was 20 minutes.    Clinical state: waking only  Response to hyperventilation: unremarkable   Response to photic stimulation: unremarkable     The waking background showed appropriate organization with clearly defined anterior-posterior voltage and frequency gradients.  There was a well-defined posterior dominant rhythm of 10 Hertz, which was symmetrical and showed normal reactivity.  Anteriorly, there was an expected pattern of lower voltage, irregular, mixed faster frequencies.      Throughout the recording, there were no epileptiform discharges, paroxysmal features, focal features or significant interhemispheric asymmetries.    IMPRESSION:  This is a normal waking EEG.  There were no focal or epileptiform abnormalities.    Signed by: Hiram Comber, MD on 10/28/2011 at 3:28 PM    I personally interpreted this EEG. I reviewed and edited Dr. Beather Arbour note and confirm the findings as documented above.    Rema Fendt, MD

## 2011-10-28 NOTE — Treatment Plan (Addendum)
Neurology Progress note    Patient's EEG was evaluated and without clear epileptiform activity. Clinical presentation more consistent with syncopal event. At best, this spell could be regarded as a provoked seizure. We will therefore not start anticonvulsant activity. He will follow-up in 4-8 weeks in our General Neurology clinic [phone]). At the time we will decide if we will repeat an outpatient EEG. No further neurological workup needed at this time.    Full note to follow.     The patient was seen and discussed with attending physician Dr. Dionisio David MD/PhD    Co-signature     Patrick North, MD,PHD

## 2011-10-28 NOTE — ED Notes (Signed)
Report given to tony in [place]

## 2011-10-28 NOTE — ED Notes (Signed)
Pt arrived to unit via stretcher.  Pt ambulated to bed without assistance.  Pt oriented to unit and call bell. Pt A&OX4.  Plan of care:  Pain control, oral care, oral suctioning prn, potassium replacement, seizure precautions, and observation.

## 2011-10-28 NOTE — ED Obs Notes (Signed)
ED OBSERVATION FOLLOW-UP NOTE    Patient:  Robert Valdez  Patient states he is having trouble swallowing because his tongue is so sore.  No drooling, no pooled saliva, tongue now grossly ecchymotic, inferior, anterior aspect.  No stridor, handling saliva well. Patient states Morphine not doing anything for pain, requesting a different pain  medication " you push a little, by little, and it is not Morphine, but it works".  With much encouragement , patient able to take small sip of potassium liquid, but stated it hurt his tongue.   Offered patient oral pain medications, states he can't swallow because it hurts too much.      If patient unable to tolerate oral fluids, CK's remain elevated despite aggressive IVF's, and patient continues to have hypokalemia, may require admission.   PLAN:   Will get ST films of neck to look for additional oral/posterior pharynx pathology.  Consider ENT consult.               Consider Neurology consult                Will try BMX, Lortab, Toradol  For pain control               Will add K+ to IVF's, 3 runs of Potassium IV ( 10 mEq each)               I& O's                Place IVF's on pump      Author: Sharon Mt, NP  Note created: 10/28/2011  at: 5:34 AM

## 2011-10-28 NOTE — ED Notes (Signed)
Patient is resting comfortably. 

## 2011-10-28 NOTE — Student Note (Addendum)
NEUROLOGY Consult Note    Reason for consult: Presumed seizure assessment.    History of Present Illness:  Robert Valdez is a 36 y/o man with PMH of CAD, HTN, obesity and some psychiatric disorder who was brought to ED by EMS on 10/26/2011 after presumed seizure. Patient has been feeling abdominal pain and decreased appetite for the past two weeks, and also states to have diarrhea and large amount of bloody stool.     According to the patient, around 20:00 10/26/2011, he went for bathroom and felt light-headedness after standing up from couch. When he finished voiding, turned off the light and opened the door, he fell backward, hitting his head on the bathroom wall. He sat on the bathroom floor to "hold" himself for a while and eventually managed to get to the couch with persistent light-headedness, and he has no memory of anything happened afterward.     His sister, a Engineer, site, states that she had a 5-minute conversation with him and handed him a glass of water after he sat down. After that, she noticed that the patient is having a seizure-like movement with "locked up" bilateral upper arms and generalized stiffness and became unresponsive. Some jerky movements of both arms also were also present throughout the episode, but whether the jerky movement was rhythmic or not is unclear. Patient's eyes were closed and he was biting his tongue during the seizure. The whole episode lasted for about 5 minutes, and patient regained conscouscess in front of EMS with severe headache and confusion.     Upon arrival to ED, he had stable vital signs and was noted to have elevated WBC to 18.7 and CK level of 507 (increased to 3772 the next day). Hypokalemia (2.9) was also noted, and he was started on potassium chloride. Head and abdominal CT were done and did not reveal any abnormality. Neurology are consulted for seizure assessment. Patient was alert and oriented during interview and examination with attending and neurology  team.     Past Medical History   Diagnosis Date   . Coronary artery disease    . Obesity    . Psychiatric disorder    . Hypertension      Past Surgical History   Procedure Date   . Cholecystectomy    . Wrist surgery      History reviewed. No pertinent family history.  History     Social History   . Marital Status: Married     Spouse Name: N/A     Number of Children: N/A   . Years of Education: N/A     Social History Main Topics   . Smoking status: Former Smoker     Types: Cigarettes     Quit date: 10/13/2011   . Smokeless tobacco: Never Used   . Alcohol Use: No      none in 3 weeks-previously every weekend   . Drug Use: No   . Sexually Active:      Other Topics Concern   . None     Social History Narrative   . None       Allergies: No Known Allergies (drug, envir, food or latex)    Prior to Admission Medications:    (Not in a hospital admission)    Active Hospital Medications:  Current Facility-Administered Medications   Medication Dose Route Frequency   . ketorolac (TORADOL) injection 30 mg  30 mg Intravenous Q6H PRN   . HYDROcodone-acetaminophen (LORTAB) 7.5-500 MG/15ML solution 15 mL  15 mL Oral Q6H PRN   . enoxaparin (LOVENOX) injection 40 mg  40 mg Subcutaneous Q24H   . 0.9% NaCl with KCl 20 mEq per liter  125 mL/hr Intravenous Continuous   . LORazepam (ATIVAN) injection 0.5 mg  0.5 mg Intravenous Q6H PRN   . pantoprazole (PROTONIX) EC tablet 40 mg  40 mg Oral BID AC   . metoprolol (TOPROL-XL) 24 hr tablet 50 mg  50 mg Oral Daily   . chlorthalidone (HYGROTEN) tablet 25 mg  25 mg Oral QAM   . sertraline (ZOLOFT) tablet 200 mg  200 mg Oral Daily   . Calcium Carbonate 650 MG tablet TABS 1,300 mg  1,300 mg Oral Q12H SCH   . morphine 4 MG/ML injection 4 mg  4 mg Intravenous Q3H PRN     Current Outpatient Prescriptions   Medication   . omeprazole (PRILOSEC) 20 MG capsule   . metoprolol (TOPROL-XL) 50 MG 24 hr tablet   . chlorthalidone (HYGROTEN) 25 MG tablet   . sertraline (ZOLOFT) 100 MG tablet     Recent Results  (from the past 24 hour(s))   PLASMA PROF 7 [facility] ED ONLY)    Collection Time    10/27/11  6:38 PM       Component Value Range    Chloride,Plasma 100  96 - 108 mmol/L    CO2,Plasma 28  20 - 28 mmol/L    Potassium,Plasma 2.8 (*) 3.4 - 4.7 mmol/L    Sodium,Plasma 138  132 - 146 mmol/L    Anion Gap 10  7 - 16    UN,Plasma 10  6 - 20 mg/dL    Creatinine,Plasma 7.84  0.67 - 1.17 mg/dL    GFR,Caucasian 696      GFR,Black 132      Glucose,Plasma 88  60 - 99 mg/dL   URINALYSIS WITH MICROSCOPIC    Collection Time    10/27/11  6:39 PM       Component Value Range    Color, UA Yellow  Yellow    Appearance,UR Clear  Clear    Specific Gravity,UA 1.015  1.002 - 1.030    Leuk Esterase,UA NEG  NEGATIVE    Nitrite,UA NEG  NEGATIVE    pH,UA 6.0  5.0 - 8.0    Protein,UA NEG  NEGATIVE mg/dL    Glucose,UA NORM      Ketones, UA NEG  NEGATIVE    Blood,UA NEG  NEGATIVE    RBC,UA None Seen  0 - 2 /hpf    WBC,UA <1  0 - 5 /hpf    Squam Epithel,UA 1+  0-1+    Mucus,UA Present     DRUG SCREEN CHEMICAL DEPENDENCY, URINE    Collection Time    10/27/11  6:39 PM       Component Value Range    Amphetamine,UR NEG      Cocaine/Metab,UR NEG      Benzodiazepinen,UR NEG      Opiates,UR POS      THC Metabolite,UR NEG     CONFIRM OPIATES    Collection Time    10/27/11  6:39 PM       Component Value Range    Confirm Opiates POS     CK ISOENZYMES    Collection Time    10/27/11 11:38 PM       Component Value Range    CK 3579 (*) 46 - 171 U/L    Mass CKMB 8.3 (*) 0.0 - 4.9 ng/mL  Relative Index 0.2  0.0 - 5.0 %   PLASMA PROF 7 [facility] ED ONLY)    Collection Time    10/27/11 11:38 PM       Component Value Range    Chloride,Plasma 102  96 - 108 mmol/L    CO2,Plasma 29 (*) 20 - 28 mmol/L    Potassium,Plasma 2.9 (*) 3.4 - 4.7 mmol/L    Sodium,Plasma 140  132 - 146 mmol/L    Anion Gap 9  7 - 16    UN,Plasma 8  6 - 20 mg/dL    Creatinine,Plasma 1.61  0.67 - 1.17 mg/dL    GFR,Caucasian 096      GFR,Black 128      Glucose,Plasma 86  60 - 99 mg/dL   MAGNESIUM    Collection Time     10/27/11 11:38 PM       Component Value Range    Magnesium 1.8  1.3 - 2.1 mEq/L   CK ISOENZYMES    Collection Time    10/28/11  6:00 AM       Component Value Range    CK 3077 (*) 46 - 171 U/L    Mass CKMB 6.7 (*) 0.0 - 4.9 ng/mL    Relative Index 0.2  0.0 - 5.0 %   CBC AND DIFFERENTIAL    Collection Time    10/28/11  6:00 AM       Component Value Range    WBC 11.4 (*) 4.2 - 9.1 THOU/uL    RBC 4.6  4.6 - 6.1 MIL/uL    Hemoglobin 14.2  13.7 - 17.5 g/dL    Hematocrit 41  40 - 51 %    MCV 89  79 - 92 fL    RDW 13.1  11.6 - 14.4 %    Platelets 169  150 - 330 THOU/uL    Seg Neut % 75.3 (*) 34.0 - 67.9 %    Lymphocyte % 16.3 (*) 21.8 - 53.1 %    Monocyte % 7.3  5.3 - 12.2 %    Eosinophil % 1.0  0.8 - 7.0 %    Basophil % 0.1 (*) 0.2 - 1.2 %    Neut # K/uL 8.6 (*) 1.8 - 5.4 THOU/uL    Lymph # K/uL 1.9  1.3 - 3.6 THOU/uL    Mono # K/uL 0.8  0.3 - 0.8 THOU/uL    Eos # K/uL 0.1  0.0 - 0.5 THOU/uL    Baso # K/uL 0.0  0.0 - 0.1 THOU/uL   PLASMA PROF 7 [facility] ED ONLY)    Collection Time    10/28/11  6:00 AM       Component Value Range    Chloride,Plasma 101  96 - 108 mmol/L    CO2,Plasma 27  20 - 28 mmol/L    Potassium,Plasma 3.3 (*) 3.4 - 4.7 mmol/L    Sodium,Plasma 139  132 - 146 mmol/L    Anion Gap 11  7 - 16    UN,Plasma 8  6 - 20 mg/dL    Creatinine,Plasma 0.45  0.67 - 1.17 mg/dL    GFR,Caucasian 409      GFR,Black 133      Glucose,Plasma 95  60 - 99 mg/dL   MAGNESIUM    Collection Time    10/28/11  6:00 AM       Component Value Range    Magnesium 1.8  1.3 - 2.1 mEq/L   CK ISOENZYMES    Collection Time  10/28/11 10:43 AM       Component Value Range    CK 1593 (*) 46 - 171 U/L    Mass CKMB 3.7  0.0 - 4.9 ng/mL    Relative Index 0.2  0.0 - 5.0 %   POTASSIUM    Collection Time    10/28/11 10:43 AM       Component Value Range    Potassium 9.0 (*) 3.3 - 5.1 mmol/L   PLASMA PROF 7 [facility] ED ONLY)    Collection Time    10/28/11 10:43 AM       Component Value Range    Chloride,Plasma 118 (*) 96 - 108 mmol/L    CO2,Plasma 21  20 - 28 mmol/L     Potassium,Plasma 7.8 (*) 3.4 - 4.7 mmol/L    Sodium,Plasma 143  132 - 146 mmol/L    Anion Gap 4 (*) 7 - 16    UN,Plasma 6  6 - 20 mg/dL    Creatinine,Plasma 0.98 (*) 0.67 - 1.17 mg/dL    GFR,Caucasian 119      GFR,Black 151      Glucose,Plasma 66  60 - 99 mg/dL   BASIC METABOLIC PANEL    Collection Time    10/28/11  1:00 PM       Component Value Range    Glucose 90  60 - 99 mg/dL    Sodium 147  829 - 562 mmol/L    Potassium 3.2 (*) 3.3 - 5.1 mmol/L    Chloride 100  96 - 108 mmol/L    CO2 30 (*) 20 - 28 mmol/L    Anion Gap 8  7 - 16    UN 9  6 - 20 mg/dL    Creatinine 1.30  8.65 - 1.17 mg/dL    GFR,Caucasian 784      GFR,Black 130      Calcium 8.5 (*) 9.0 - 10.3 mg/dL        *Note: This is not all of the results for the requested time period. They were limited due to a high amount of results. Please view the rest in Results Review.       Review of Systems:  Review of Systems   HENT:        Swollen tongue   Eyes: Negative.    Cardiovascular: Positive for chest pain.   Gastrointestinal: Positive for nausea, abdominal pain, diarrhea and blood in stool.   Genitourinary: Negative.    Musculoskeletal: Positive for back pain.   Skin: Negative.    Neurological: Positive for dizziness, tingling, seizures, loss of consciousness and headaches.   Endo/Heme/Allergies: Negative.    Psychiatric/Behavioral: Negative.        Last Nursing documented pain:  0-10 Scale: 10 (10/28/11 1706)        Patient Vitals for the past 24 hrs:   BP Temp Temp src Pulse Resp SpO2   10/28/11 1300 120/82 mmHg 36.4 C (97.5 F) TEMPORAL 73  17  98 %   10/28/11 0915 123/75 mmHg 37 C (98.6 F) TEMPORAL - 18  95 %   10/28/11 0557 133/73 mmHg - - 80  22  92 %   10/28/11 0142 131/75 mmHg - - 85  23  96 %   10/27/11 2218 121/72 mmHg 35.9 C (96.6 F) TEMPORAL 78  19  92 %   10/27/11 1852 - 36.9 C (98.4 F) TEMPORAL 76  23  95 %     O2 Device: None (Room air) (10/28/11  1300)    Physical Exam   Constitutional: He is oriented to person, place, and time. He  appears well-developed and well-nourished. Sleepy at the end of interview  Head: Normocephalic and atraumatic.   Right Ear: External ear normal.   Left Ear: External ear normal.   Nose: Nose normal.   Patient's tongue appears discolored with superficial abrasion/bite marks noted => probably due to tongue biting   Eyes: Conjunctivae and EOM are normal.   Neck: Normal range of motion.   Cardiovascular: Normal rate, regular rhythm and normal heart sounds.   Pulmonary/Chest: Effort normal and breath sounds normal. No respiratory distress.   Abdominal: Soft. He exhibits no distension.   Musculoskeletal: Normal range of motion.   Neurological: He is alert and oriented to person, place, and time.   Skin: Skin is warm and dry. He is not diaphoretic.   Psychiatric: He has a normal mood and affect. His behavior is normal. Judgment and thought content normal.    Physical Exam   Constitutional: He is oriented to person, place, and time.   Eyes: EOM are normal. Pupils are equal, round, and reactive to light.   Neurological: He is oriented to person, place, and time. He has a normal Finger-Nose-Finger Test. Gait normal.   Psychiatric: His speech is normal.       Neurological Examination:  Neurologic Exam     Mental Status   Oriented to person, place, and time.       Registration: recalls 3 of 3 objects.   Attention: normal.   Speech: speech is normal   Level of consciousness: alert  Able to name object. Normal comprehension.     Cranial Nerves      CN II   Visual fields full to confrontation.      CN III, IV, VI   Pupils are equal, round, and reactive to light.  Extraocular motions are normal.      CN V   Facial sensation intact.      CN VII   Facial expression full, symmetric.      CN IX, X   CN IX normal.   CN X normal.   Asymmetric palate: hard to see due to swollen tongue.     CN XI   CN XI normal.      CN XII   CN XII normal.        Swollen tongue     Motor Exam   Muscle bulk: normal  Overall muscle tone: normal       MMT:  Mild weakness (4+) on left Quadiceps     Sensory Exam   Light touch normal.   Pinprick normal.        Tingling sensation over left thigh, comes and goes     Gait, Coordination, and Reflexes      Gait  Gait: normal     Coordination   Finger to nose coordination: normal      Radiology impressions (last 3 days):  Ct Abdomen And Pelvis With Contrast    10/28/2011  IMPRESSION:    1. No evidence of diverticulitis.  2. Fatty liver. 3. Status post cholecystectomy.  4. Complex cyst in the left kidney. Ultrasound can be done for  further evaluation  The consultation was reviewed and approved by an attending radiologist after exam interpretation with a radiologist in training or PA.     Ct Head Without Contrast    10/27/2011  IMPRESSION:     No acute intracranial abnormality.  Mild left posterior scalp swelling.   END OF IMPRESSION:     * Portable Chest Standard Ap Single View    10/26/2011  Impression: No acute cardiopulmonary disease.         Currently Active/Followed Hospital Problems:  There are no hospital problems to display for this patient.      Assessment:   This is a 36 y/o man with PMH of CAD, HTN, obesity and psychiatric disorder who presented to ED with presumed seizure for the first time. The description from patient's sister of tongue biting, bilateral jerky movements, generalized stiffness, loss of memory, relatively long "postictal" confusion and non-localized headache afterward does raise concern of seizure. Elevated WBC and CK after the episode are also compatible with seizure in etiology. Possible provoking factors of seizure include uncontrolled HTN? Diarrhea? Infection? or Drug abuse (positve urine opiate), but It is hard to tell now. On the other hand, his typical pre-syncopal symptoms of light-headedness and GI discomfort can also suggest likelihood of syncope. In either situation, it is not necessary to start him on any AEDs for now unless there is abnormal discharge on formal EEG report. Preliminary EEG  study showed normal brain wave with some wicked theta waves, but it's hard to interpret due to persistent background artifacts.     Recommendations:   -- Patient can be discharged from Neurology stand point.  -- May benefit from another EEG study in the future.    Author: Laurice Record  as of: 10/28/2011  at: 5:55 PM    Co-signature -- see addendum to Dr. Myrtie Cruise note.    Patrick North, MD,PHD

## 2011-10-29 LAB — CBC AND DIFFERENTIAL
Baso # K/uL: 0 10*3/uL (ref 0.0–0.1)
Basophil %: 0.2 % (ref 0.2–1.2)
Eos # K/uL: 0.1 10*3/uL (ref 0.0–0.5)
Eosinophil %: 1.4 % (ref 0.8–7.0)
Hematocrit: 39 % — ABNORMAL LOW (ref 40–51)
Hemoglobin: 14.2 g/dL (ref 13.7–17.5)
Lymph # K/uL: 2.3 10*3/uL (ref 1.3–3.6)
Lymphocyte %: 21.9 % (ref 21.8–53.1)
MCV: 88 fL (ref 79–92)
Mono # K/uL: 0.9 10*3/uL — ABNORMAL HIGH (ref 0.3–0.8)
Monocyte %: 8.2 % (ref 5.3–12.2)
Neut # K/uL: 7.1 10*3/uL — ABNORMAL HIGH (ref 1.8–5.4)
Platelets: 160 10*3/uL (ref 150–330)
RBC: 4.4 MIL/uL — ABNORMAL LOW (ref 4.6–6.1)
RDW: 12.7 % (ref 11.6–14.4)
Seg Neut %: 68.3 % — ABNORMAL HIGH (ref 34.0–67.9)
WBC: 10.3 10*3/uL — ABNORMAL HIGH (ref 4.2–9.1)

## 2011-10-29 LAB — BASIC METABOLIC PANEL
Anion Gap: 11 (ref 7–16)
CO2: 26 mmol/L (ref 20–28)
Calcium: 8.8 mg/dL — ABNORMAL LOW (ref 9.0–10.3)
Chloride: 101 mmol/L (ref 96–108)
Creatinine: 0.8 mg/dL (ref 0.67–1.17)
GFR,Black: 132 *
GFR,Caucasian: 114 *
Glucose: 88 mg/dL (ref 60–99)
Lab: 9 mg/dL (ref 6–20)
Potassium: 3.3 mmol/L (ref 3.3–5.1)
Sodium: 138 mmol/L (ref 133–145)

## 2011-10-29 LAB — PLASMA PROF 7 (ED ONLY)
Anion Gap,PL: 14 (ref 7–16)
Anion Gap,PL: 12 (ref 7–16)
Anion Gap,PL: 13 (ref 7–16)
CO2,Plasma: 25 mmol/L (ref 20–28)
CO2,Plasma: 26 mmol/L (ref 20–28)
CO2,Plasma: 25 mmol/L (ref 20–28)
Chloride,Plasma: 100 mmol/L (ref 96–108)
Chloride,Plasma: 101 mmol/L (ref 96–108)
Chloride,Plasma: 100 mmol/L (ref 96–108)
Creatinine: 0.76 mg/dL (ref 0.67–1.17)
Creatinine: 0.74 mg/dL (ref 0.67–1.17)
Creatinine: 0.77 mg/dL (ref 0.67–1.17)
GFR,Black: 135 *
GFR,Black: 136 *
GFR,Black: 134 *
GFR,Caucasian: 117 *
GFR,Caucasian: 118 *
GFR,Caucasian: 116 *
Glucose,Plasma: 86 mg/dL (ref 60–99)
Glucose,Plasma: 101 mg/dL — ABNORMAL HIGH (ref 60–99)
Glucose,Plasma: 92 mg/dL (ref 60–99)
Potassium,Plasma: 3.1 mmol/L — ABNORMAL LOW (ref 3.4–4.7)
Potassium,Plasma: 3.1 mmol/L — ABNORMAL LOW (ref 3.4–4.7)
Potassium,Plasma: 3.1 mmol/L — ABNORMAL LOW (ref 3.4–4.7)
Sodium,Plasma: 139 mmol/L (ref 132–146)
Sodium,Plasma: 139 mmol/L (ref 132–146)
Sodium,Plasma: 138 mmol/L (ref 132–146)
UN,Plasma: 8 mg/dL (ref 6–20)
UN,Plasma: 8 mg/dL (ref 6–20)
UN,Plasma: 8 mg/dL (ref 6–20)

## 2011-10-29 LAB — CK ISOENZYMES
CK: 1464 U/L — ABNORMAL HIGH (ref 46–171)
CK: 1138 U/L — ABNORMAL HIGH (ref 46–171)
CK: 921 U/L — ABNORMAL HIGH (ref 46–171)
Mass CKMB: 2.9 ng/mL (ref 0.0–4.9)
Mass CKMB: 2.7 ng/mL (ref 0.0–4.9)
Mass CKMB: 2.5 ng/mL (ref 0.0–4.9)
Relative Index: 0.2 % (ref 0.0–5.0)
Relative Index: 0.2 % (ref 0.0–5.0)
Relative Index: 0.3 % (ref 0.0–5.0)

## 2011-10-29 LAB — HOLD LAVENDER

## 2011-10-29 MED ORDER — KETOROLAC TROMETHAMINE 30 MG/ML IJ SOLN *I*
30.0000 mg | Freq: Once | INTRAMUSCULAR | Status: AC
Start: 2011-10-29 — End: 2011-10-29
  Administered 2011-10-29: 30 mg via INTRAVENOUS
  Filled 2011-10-29: qty 1

## 2011-10-29 NOTE — ED Obs Notes (Signed)
ED OBSERVATION DISCHARGE NOTE    Patient seen by me today, 10/29/2011 at 4:04 PM.    Current patient status: Observation    Subjective:  " feel better "    Observation Stay Includes:  36 y.o.male who presented to the ED with Chief Complaint   Patient presents with   . Seizures   . Chest Pain     Robert Valdez was placed in observation following emergency department evaluation for loss of consciousness. OBS course included telemetry, serial troponin and EKG, seizure precautions, IV hydration, potassium repletion, analgesia, home medications, neurology consult and EEG.  Patient is asymptomatic and agreeable to discharge to home today.  Supervising physician Gerrie Nordmann was immediately available.    Last Nursing documented pain:  0-10 Scale: 10 (10/29/11 1420)      Vitals:  Patient Vitals for the past 24 hrs:   BP Temp Temp src Pulse Resp SpO2   10/29/11 1351 137/81 mmHg 36.7 C (98.1 F) Tympanic 65  18  98 %   10/29/11 0942 152/100 mmHg 36.9 C (98.4 F) Tympanic 75  18  97 %   10/29/11 0715 151/114 mmHg 37.2 C (99 F) TEMPORAL 75  20  94 %   10/29/11 0149 146/81 mmHg 37 C (98.6 F) - 64  20  94 %   10/28/11 2141 143/90 mmHg 36.8 C (98.2 F) TEMPORAL 72  20  94 %   10/28/11 1813 136/80 mmHg 35.8 C (96.4 F) TEMPORAL 74  18  97 %         Physical Exam:  Physical Exam   Constitutional: He is oriented to person, place, and time. He appears well-developed and well-nourished. No distress.   HENT:   Head: Normocephalic.   Mouth/Throat: Oropharynx is clear and moist.        No facial injuries, tongue is tender, swollen, uvula midline, swallow intact. No submandibular lymphadenopathy.    Eyes: Conjunctivae and EOM are normal. Pupils are equal, round, and reactive to light.   Neck: Normal range of motion. Neck supple.   Cardiovascular: Normal rate, regular rhythm, normal heart sounds and intact distal pulses.    Pulmonary/Chest: Effort normal and breath sounds normal.   Abdominal: Soft. Bowel sounds are normal. There is  tenderness. There is no rebound and no guarding.   Musculoskeletal: Normal range of motion. He exhibits no edema.        Ambulatory steady gait, strong grip bilaterally.   Lymphadenopathy:     He has no cervical adenopathy.   Neurological: He is alert and oriented to person, place, and time. Coordination normal.   Skin: Skin is warm and dry. He is not diaphoretic.   Psychiatric: He has a normal mood and affect.       EKG:  Sinus rhythm, 108 bpm borderline prolonged QT interval (QTC 494).     Labs:   Recent Results (from the past 24 hour(s))   CK ISOENZYMES    Collection Time    10/28/11  9:43 PM       Component Value Range    CK 1820 (*) 46 - 171 U/L    Mass CKMB 3.7  0.0 - 4.9 ng/mL    Relative Index 0.2  0.0 - 5.0 %   PLASMA PROF 7 [facility] ED ONLY)    Collection Time    10/28/11  9:43 PM       Component Value Range    Chloride,Plasma 100  96 - 108 mmol/L    CO2,Plasma  29 (*) 20 - 28 mmol/L    Potassium,Plasma 3.2 (*) 3.4 - 4.7 mmol/L    Sodium,Plasma 140  132 - 146 mmol/L    Anion Gap 11  7 - 16    UN,Plasma 9  6 - 20 mg/dL    Creatinine,Plasma 1.61  0.67 - 1.17 mg/dL    GFR,Caucasian 096      GFR,Black 134      Glucose,Plasma 83  60 - 99 mg/dL   BASIC METABOLIC PANEL    Collection Time    10/29/11  3:43 AM       Component Value Range    Glucose 88  60 - 99 mg/dL    Sodium 045  409 - 811 mmol/L    Potassium 3.3  3.3 - 5.1 mmol/L    Chloride 101  96 - 108 mmol/L    CO2 26  20 - 28 mmol/L    Anion Gap 11  7 - 16    UN 9  6 - 20 mg/dL    Creatinine 9.14  7.82 - 1.17 mg/dL    GFR,Caucasian 956      GFR,Black 132      Calcium 8.8 (*) 9.0 - 10.3 mg/dL   CK ISOENZYMES    Collection Time    10/29/11  3:43 AM       Component Value Range    CK 1464 (*) 46 - 171 U/L    Mass CKMB 2.9  0.0 - 4.9 ng/mL    Relative Index 0.2  0.0 - 5.0 %   CBC AND DIFFERENTIAL    Collection Time    10/29/11  3:43 AM       Component Value Range    WBC 10.3 (*) 4.2 - 9.1 THOU/uL    RBC 4.4 (*) 4.6 - 6.1 MIL/uL    Hemoglobin 14.2  13.7 - 17.5 g/dL    Hematocrit  39 (*) 40 - 51 %    MCV 88  79 - 92 fL    RDW 12.7  11.6 - 14.4 %    Platelets 160  150 - 330 THOU/uL    Seg Neut % 68.3 (*) 34.0 - 67.9 %    Lymphocyte % 21.9  21.8 - 53.1 %    Monocyte % 8.2  5.3 - 12.2 %    Eosinophil % 1.4  0.8 - 7.0 %    Basophil % 0.2  0.2 - 1.2 %    Neut # K/uL 7.1 (*) 1.8 - 5.4 THOU/uL    Lymph # K/uL 2.3  1.3 - 3.6 THOU/uL    Mono # K/uL 0.9 (*) 0.3 - 0.8 THOU/uL    Eos # K/uL 0.1  0.0 - 0.5 THOU/uL    Baso # K/uL 0.0  0.0 - 0.1 THOU/uL   PLASMA PROF 7 [facility] ED ONLY)    Collection Time    10/29/11  3:43 AM       Component Value Range    Chloride,Plasma 100  96 - 108 mmol/L    CO2,Plasma 25  20 - 28 mmol/L    Potassium,Plasma 3.1 (*) 3.4 - 4.7 mmol/L    Sodium,Plasma 139  132 - 146 mmol/L    Anion Gap 14  7 - 16    UN,Plasma 8  6 - 20 mg/dL    Creatinine,Plasma 2.13  0.67 - 1.17 mg/dL    GFR,Caucasian 086      GFR,Black 135      Glucose,Plasma 86  60 -  99 mg/dL   PLASMA PROF 7 [facility] ED ONLY)    Collection Time    10/29/11  9:53 AM       Component Value Range    Chloride,Plasma 101  96 - 108 mmol/L    CO2,Plasma 26  20 - 28 mmol/L    Potassium,Plasma 3.1 (*) 3.4 - 4.7 mmol/L    Sodium,Plasma 139  132 - 146 mmol/L    Anion Gap 12  7 - 16    UN,Plasma 8  6 - 20 mg/dL    Creatinine,Plasma 9.60  0.67 - 1.17 mg/dL    GFR,Caucasian 454      GFR,Black 136      Glucose,Plasma 101 (*) 60 - 99 mg/dL   CK ISOENZYMES    Collection Time    10/29/11  9:53 AM       Component Value Range    CK 1138 (*) 46 - 171 U/L    Mass CKMB 2.7  0.0 - 4.9 ng/mL    Relative Index 0.2  0.0 - 5.0 %   PLASMA PROF 7 [facility] ED ONLY)    Collection Time    10/29/11  3:15 PM       Component Value Range    Chloride,Plasma 100  96 - 108 mmol/L    CO2,Plasma 25  20 - 28 mmol/L    Potassium,Plasma 3.1 (*) 3.4 - 4.7 mmol/L    Sodium,Plasma 138  132 - 146 mmol/L    Anion Gap 13  7 - 16    UN,Plasma 8  6 - 20 mg/dL    Creatinine,Plasma 0.98  0.67 - 1.17 mg/dL    GFR,Caucasian 119      GFR,Black 134      Glucose,Plasma 92  60 - 99 mg/dL   CK  ISOENZYMES    Collection Time    10/29/11  3:15 PM       Component Value Range    CK 921 (*) 46 - 171 U/L    Mass CKMB 2.5  0.0 - 4.9 ng/mL    Relative Index 0.3  0.0 - 5.0 %     Imaging findings:     Ct Abdomen And Pelvis With Contrast    10/28/2011  Exam Site: [facility]  Oct 27, 2011 10:21:00 AM    ABDOMEN AND PELVIS CT   CLINICAL INFORMATION: Left lower abdominal pain, nausea vomiting  diarrhea assess for diverticulitis   COMPARISON EXAMS: None.Marland Kitchen   PROCEDURE: Contiguous axial images are obtained from the diaphragm  through the ischial tuberosities following intravenous administration  of 143cc of Optiray 350. Oral contrast was administered prior to the  study.    ABDOMEN FINDINGS:   Chest Base: Mild bibasilar atelectasis.   Liver/Biliary Tract: Diffuse fatty infiltration of the liver. Status  post cholecystectomy.   Pancreas: Normal.    Spleen: Normal.   Adrenals: Normal.   Kidneys and Collecting Systems: 1 cm hypodense lesion in the left  kidney on image [id number] may represent a complex cyst. Ultrasound can be  done for further evaluation. Small hypodensity in the upper pole of  the left kidney is too small to characterize.    Lymph Nodes: No lymphadenopathy   Vessels: Unremarkable.   Abdominal GI Tract/Mesentery and Peritoneal Cavity: No evidence of  diverticulitis. No evidence of bowel obstruction. No free fluid. No  free air.    Soft Tissues/Musculoskeletal: No acute osseous or soft tissue  abnormalities.    PELVIS FINDINGS:  Visualized Reproductive Organs: Unremarkable.    Bladder: Normal.   Lymph Nodes: No lymphadenopathy.    Vessels: Unremarkable.   Pelvic GI Tract/Mesentery and Peritoneal Cavity: No evidence of  diverticulitis. No evidence of bowel obstruction. Normal appendix is  visualized.    Soft Tissues/Musculoskeletal: No acute osseous or soft tissue  abnormality.        10/28/2011  IMPRESSION:    1. No evidence of diverticulitis.  2. Fatty liver. 3. Status post  cholecystectomy.  4. Complex cyst in the left kidney. Ultrasound can be done for  further evaluation  The consultation was reviewed and approved by an attending radiologist after exam interpretation with a radiologist in training or PA.     Ct Head Without Contrast    10/27/2011  Exam Site: [facility]  Oct 27, 2011 1:01:00 AM   HEAD CT WITHOUT CONTRAST   CLINICAL INFORMATION: Seizure. Fall.    COMPARISON: No prior   HEAD CT PROCEDURE (CTN10):  Contiguous axial tomographic sections  were obtained from the base to the vertex without intravenous  contrast.   HEAD CT FINDINGS:  The ventricles and subarachnoid spaces are normal  in size and appearance.  No parenchymal, intra-, or extra-axial  abnormalities are identified. There is no hemorrhage or acute  infarction identified. No significant osseous abnormalities are seen.   There is mild left posterior scalp swelling. There is mild mucosal  thickening of the ethmoid sinuses. The remaining paranasal sinuses  and mastoid air cells are adequately aerated.        10/27/2011  IMPRESSION:     No acute intracranial abnormality. Mild left posterior scalp swelling.   END OF IMPRESSION:     Neck Soft Tissue Ap And Lateral Views (see Image Cast)    10/28/2011  10/28/11 5:55 AM  appointment at : [facility]  Imaging     * Portable Chest Standard Ap Single View    10/26/2011  Exam Site: [facility]  Chest Radiograph: Single Portable View of the Chest 10/26/2011   Indication: Chest pain   Comparison: None   Lungs: No focal airspace interstitial disease. No pleural effusion or  pneumothorax is noted.   Heart and Mediastinum: Normal in size   Bones and Soft Tissues: Osseous structures are intact Tubes and Lines: None       10/26/2011  Impression: No acute cardiopulmonary disease.     Cardiac Testing:  Tele and ekg only     Consults: neurology see EMR for notes    Assessment: 36 yo male with PMH HTN and  GERD (questionable CAD MI as patient denies this occurred to Clinical research associate) who experienced an episode of LOC at home, questionable seizure (no known history of seizures) vs syncope. Assessed by Neuro, EEG normal; no medications started; they will see him in follow up. With improved abdominal pain, sore tongue (appears he bit tongue), who had elevated CK (3579) now improved to 921, urine positive for opiates, ruled out for  ACS, no arrythmia's on tele; tolerating soft diet, ambulatory and agreeable to discharge home.     Plan: patient advised he should not drive until cleared by his PCP. No medication changes.    Disposition: Home family will transport.  Follow-up:  within the next 2-5 days.  with  PCP, 4-8 weeks with neurology clinic.  Smoking Cessation: NA    Diagnoses that have been ruled out:   None   Diagnoses that are  still under consideration:   None   Final diagnoses:   Chest pain   Abdominal pain       Author: Michaelle Copas, NP  Note created: 10/29/2011  at: 3:50 PM

## 2011-10-29 NOTE — Discharge Instructions (Signed)
As it has not been determined why you had the episode of loss of consciousness we must advise you not to drive until cleared by your physician to do so.    Please schedule an appointment with your primary care provider this coming week.    Call [phone] and schedule follow up with neuro clinic in 4-8 weeks.    No medication changes.    Syncope (Fainting Episode)     You have had a fainting (syncopal) spell. A fainting episode is a sudden, short-lived loss of consciousness. It results in complete recovery. It occurs because there has been a temporary shortage of oxygen and/or sugar  (glucose) to the brain.     CAUSES   Blood pressure pills and other medications that may lower blood pressure below normal. Sudden changes in posture (sudden standing).   Over medication. Take your medications as directed.   Standing too long. This can cause blood to pool in the legs.   Seizure disorders.   Low blood sugar (hypoglycemia) of diabetes. This more commonly causes coma.   Bearing down to go to the bathroom. This can cause your blood pressure to rise suddenly. Your body compensates by making the blood pressure too low when you stop bearing down.   Hardening of the arteries where the brain temporarily does not receive enough blood.   Irregular heart beat and circulatory problems.   Fear, emotional distress, injury, sight of blood, or illness.     Your caregiver will send you home if the syncope was from non-worrisome causes (benign). Depending on your age and health, you may stay to be monitored and observed. If you return home, have someone stay with you if your caregiver feels that is desirable.     It is very important to keep all follow-up referrals and appointments in order to properly manage this condition.    This is a serious problem which can lead to serious illness and death if not carefully managed.       WARNING: Do not drive or operate machinery until your caregiver feels that it is safe for you to do so.      SEEK IMMEDIATE MEDICAL ATTENTION IF:   You have another fainting episode or faint while lying or sitting down. DO NOT DRIVE YOURSELF. Call 911 if no other help is available.   You have chest pain, are feeling sick to your stomach (nausea), vomiting or abdominal pain.   You have an irregular heartbeat or one that is very fast (pulse over 120 beats per minute).   You have a loss of feeling in some part of your body or lose movement in your arms or legs.   You have difficulty with speech, confusion, severe weakness, or visual problems.   You become sweaty and/or feel light headed.     MAKE SURE YOU ARE RE-CHECKED AS INSTRUCTED     Document Released: 07/11/2005  Document Re-Released: 05/08/2007  [facility] Patient Information 2011 [place], [place].

## 2011-10-29 NOTE — ED Notes (Signed)
Patient removed IV in R hand while sleeping. New IV placed in R hand. Tolerated while. Fluids infusing at 125 an hour.

## 2011-10-29 NOTE — ED Notes (Signed)
Patient denied need for wheelchair. Stated that he is feeling good and want`s to go home.

## 2011-10-30 ENCOUNTER — Encounter: Payer: Self-pay | Admitting: Emergency Medicine

## 2011-10-30 ENCOUNTER — Emergency Department
Admission: EM | Admit: 2011-10-30 | Disposition: A | Discharge status: Home / Self Care | Payer: Self-pay | Source: Ambulatory Visit | Attending: Emergency Medicine | Admitting: Emergency Medicine

## 2011-10-30 ENCOUNTER — Other Ambulatory Visit: Payer: Self-pay | Admitting: Gastroenterology

## 2011-10-30 LAB — HOLD GREEN WITH GEL

## 2011-10-30 LAB — HM HIV SCREENING OFFERED

## 2011-10-30 LAB — COMPREHENSIVE METABOLIC PANEL
ALT: 44 U/L (ref 0–50)
AST: 57 U/L — ABNORMAL HIGH (ref 0–50)
Albumin: 4.5 g/dL (ref 3.5–5.2)
Alk Phos: 88 U/L (ref 40–130)
Anion Gap: 15 (ref 7–16)
Bilirubin,Total: 0.6 mg/dL (ref 0.0–1.2)
CO2: 24 mmol/L (ref 20–28)
Calcium: 8.8 mg/dL — ABNORMAL LOW (ref 9.0–10.3)
Chloride: 98 mmol/L (ref 96–108)
Creatinine: 0.84 mg/dL (ref 0.67–1.17)
GFR,Black: 129 *
GFR,Caucasian: 112 *
Glucose: 96 mg/dL (ref 60–99)
Lab: 9 mg/dL (ref 6–20)
Potassium: 3.1 mmol/L — ABNORMAL LOW (ref 3.3–5.1)
Sodium: 137 mmol/L (ref 133–145)
Total Protein: 7.6 g/dL (ref 6.3–7.7)

## 2011-10-30 LAB — CBC AND DIFFERENTIAL
Baso # K/uL: 0 10*3/uL (ref 0.0–0.1)
Basophil %: 0.2 % (ref 0.2–1.2)
Eos # K/uL: 0.1 10*3/uL (ref 0.0–0.5)
Eosinophil %: 1.2 % (ref 0.8–7.0)
Hematocrit: 40 % (ref 40–51)
Hemoglobin: 14.8 g/dL (ref 13.7–17.5)
Lymph # K/uL: 2.1 10*3/uL (ref 1.3–3.6)
Lymphocyte %: 18.4 % — ABNORMAL LOW (ref 21.8–53.1)
MCV: 87 fL (ref 79–92)
Mono # K/uL: 1 10*3/uL — ABNORMAL HIGH (ref 0.3–0.8)
Monocyte %: 8.5 % (ref 5.3–12.2)
Neut # K/uL: 8 10*3/uL — ABNORMAL HIGH (ref 1.8–5.4)
Platelets: 184 10*3/uL (ref 150–330)
RBC: 4.6 MIL/uL (ref 4.6–6.1)
RDW: 12.5 % (ref 11.6–14.4)
Seg Neut %: 71.7 % — ABNORMAL HIGH (ref 34.0–67.9)
WBC: 11.1 10*3/uL — ABNORMAL HIGH (ref 4.2–9.1)

## 2011-10-30 LAB — CSF CELL COUNT WITH DIFFERENTIAL
Nucleated Cells,CSF: 0 /uL (ref 0–5)
Nucleated Cells,CSF: 2 /uL (ref 0–5)
RBC,CSF: 2 /uL
RBC,CSF: 6 /uL

## 2011-10-30 LAB — GLUCOSE, CSF: Glucose,CSF: 63 mg/dL (ref 50–80)

## 2011-10-30 LAB — HOLD LAVENDER

## 2011-10-30 LAB — LACTATE, PLASMA: Lactate: 1.1 mmol/L (ref 0.5–2.2)

## 2011-10-30 LAB — DESCRIPTION, CSF

## 2011-10-30 LAB — AEROBIC CULTURE: Aerobic Culture: 0

## 2011-10-30 LAB — HOLD SST

## 2011-10-30 LAB — HOLD GRAY

## 2011-10-30 LAB — PROTEIN, CSF: Protein,CSF: 35 mg/dL (ref 12–60)

## 2011-10-30 LAB — GRAM STAIN: Gram Stain: 0

## 2011-10-30 MED ORDER — ACETAMINOPHEN 325 MG PO TABS *I*
650.0000 mg | ORAL_TABLET | Freq: Once | ORAL | Status: AC
Start: 2011-10-30 — End: 2011-10-30
  Administered 2011-10-30: 650 mg via ORAL
  Filled 2011-10-30: qty 2

## 2011-10-30 MED ORDER — MORPHINE SULFATE 4 MG/ML IJ SOLN
4.0000 mg | INTRAMUSCULAR | Status: DC | PRN
Start: 2011-10-30 — End: 2011-10-30
  Administered 2011-10-30: 4 mg via INTRAVENOUS
  Filled 2011-10-30: qty 1

## 2011-10-30 MED ORDER — PROMETHAZINE HCL 25 MG/ML IJ SOLN *I*
25.0000 mg | INTRAMUSCULAR | Status: DC | PRN
Start: 2011-10-30 — End: 2011-10-30
  Administered 2011-10-30: 25 mg via INTRAVENOUS
  Filled 2011-10-30: qty 1

## 2011-10-30 MED ORDER — POTASSIUM CHLORIDE CRYS CR 20 MEQ PO TBCR *I*
40.0000 meq | ORAL_TABLET | Freq: Once | ORAL | Status: AC
Start: 2011-10-30 — End: 2011-10-30
  Administered 2011-10-30: 40 meq via ORAL
  Filled 2011-10-30: qty 2

## 2011-10-30 MED ORDER — FENTANYL CITRATE 50 MCG/ML IJ SOLN *WRAPPED*
50.0000 ug | Freq: Once | INTRAMUSCULAR | Status: AC
Start: 2011-10-30 — End: 2011-10-30

## 2011-10-30 MED ORDER — FENTANYL CITRATE 50 MCG/ML IJ SOLN *WRAPPED*
100.0000 ug | Freq: Once | INTRAMUSCULAR | Status: AC
Start: 2011-10-30 — End: 2011-10-30
  Administered 2011-10-30: 07:00:00 100 ug via INTRAVENOUS

## 2011-10-30 MED ORDER — ACETAMINOPHEN 325 MG PO TABS *I*
650.0000 mg | ORAL_TABLET | Freq: Four times a day (QID) | ORAL | Status: AC | PRN
Start: 2011-10-30 — End: 2011-11-29

## 2011-10-30 MED ORDER — ONDANSETRON 4 MG PO TBDP *I*
4.0000 mg | ORAL_TABLET | Freq: Three times a day (TID) | ORAL | Status: DC | PRN
Start: 2011-10-30 — End: 2020-06-05

## 2011-10-30 MED ORDER — FENTANYL CITRATE 50 MCG/ML IJ SOLN *WRAPPED*
INTRAMUSCULAR | Status: AC
Start: 2011-10-30 — End: 2011-10-30
  Administered 2011-10-30: 50 ug via INTRAVENOUS
  Filled 2011-10-30: qty 2

## 2011-10-30 NOTE — ED Provider Notes (Addendum)
History     Chief Complaint   Patient presents with   . Headache     HPI Comments: The patient is a 36 y.o. Male with a PMH of HTN who presented to the ED 6 hours after being discharged from OBS with HA, nausea, vomiting, fever, diarrhea, and dizziness. He described the HA bitemporal and pressure like. It is constant and not improved or worsened by anything. No history of similar headaches in the past. Was admitted to OBS and discharged yesterday at 2:00 pm - admitted s/p seizure with GI illness (diarrhea and vomiting)- no history of epilepsy, and rule out ACS. He stated that he was feeling well earlier today but his symptoms returned shortly after he returned home. Denied any seizures or CP. His fever, diarrhea, vomiting, and headache continue today.    The history is provided by the patient and medical records. The history is limited by the condition of the patient. No language interpreter was used.       Past Medical History   Diagnosis Date   . Coronary artery disease    . Obesity    . Psychiatric disorder    . Hypertension             Past Surgical History   Procedure Date   . Cholecystectomy    . Wrist surgery        Family History   Problem Relation Age of Onset   . Migraines Mother          Social History      reports that he quit smoking about 2 weeks ago. His smoking use included Cigarettes. He has never used smokeless tobacco. He reports that he does not drink alcohol, use illicit drugs, or engage in sexual activity.    Living Situation     Questions Responses    Patient lives with Family    Comment:  sister in law and niece     Homeless No    Caregiver for other family member No    External Services None    Employment Unemployed    Domestic Violence Risk No          Review of Systems   Review of Systems   Constitutional: Negative for fever, chills, activity change and appetite change.   HENT: Positive for neck stiffness. Negative for facial swelling and neck pain.    Respiratory: Positive for shortness  of breath. Negative for apnea, choking, chest tightness and stridor.    Cardiovascular: Negative for chest pain.   Gastrointestinal: Positive for nausea, vomiting and diarrhea. Negative for abdominal pain.   Genitourinary: Positive for difficulty urinating.   Musculoskeletal: Negative for arthralgias.   Neurological: Positive for headaches. Negative for dizziness and facial asymmetry.   Hematological: Negative for adenopathy.   Psychiatric/Behavioral: Negative for behavioral problems and agitation.       Physical Exam       ED Triage Vitals   BP Heart Rate Resp Temp Temp Source SpO2 O2 Device O2 Flow Rate weight   10/30/11 0205 10/30/11 0205 10/30/11 0205 10/30/11 0205 10/30/11 0205 10/30/11 0205 10/30/11 0205 -- 10/30/11 0205   148/94 mmHg 86  22  38.4 C (101.1 F) Oral 96 % None (Room air)  115.667 kg (255 lb)       Physical Exam   Nursing note and vitals reviewed.  Constitutional: He is oriented to person, place, and time. He appears well-developed and well-nourished. He appears distressed (appears to be in pain).  HENT:   Head: Normocephalic and atraumatic.   Nose: Nose normal.   Mouth/Throat: No oropharyngeal exudate.        Oropharynx tough to visualize.    Eyes: Conjunctivae and EOM are normal. Pupils are equal, round, and reactive to light.        No papilledema on fundoscopic exam.   Neck: Neck supple.        No nuchal rigidity.   Cardiovascular: Normal rate, regular rhythm, normal heart sounds and intact distal pulses.    No murmur heard.  Pulmonary/Chest: Effort normal and breath sounds normal. No respiratory distress. He has no wheezes.   Abdominal: Soft. Bowel sounds are normal. He exhibits no distension. There is no tenderness. There is no rebound.   Musculoskeletal: Normal range of motion. He exhibits no edema and no tenderness.   Neurological: He is alert and oriented to person, place, and time. No cranial nerve deficit.   Skin: Skin is warm and dry. No rash noted. No erythema.   Psychiatric: He  has a normal mood and affect.       Medical Decision Making      Amount and/or Complexity of Data Reviewed  Clinical lab tests: ordered and reviewed  Tests in the radiology section of CPT: ordered and reviewed        Initial Evaluation:  ED First Provider Contact     Date/Time Event User Comments    10/30/11 0249 ED Provider First Contact NELSON, JOSHUA Initial Face to Face Provider Contact          Patient seen by me as above    Assessment:  36 y.o., male comes to the ED with a 6 hours history of nausea, vomiting, fever, HA, and diarrhea in the setting of a recent OBS stay for R/O acs/seizure monitoring.  Differential Diagnosis includes gastroenteritis vs gastritis vs metabolic derangement. Given the constellation of headache, fever, and recent seizure, I think that meningitis must be ruled out as well.    Plan:  CBC w/ diff: Mild leukocytosis, but otherwise unremarkable.  CMP: hypokalemia, but otherwise non-diagnostic.  Chest x-ray: NAD.  Blood culture x 1: Pending.  LP to r/o meningitis.  Nausea and pain control  Reassess.  Final dispo per findings and clinical status.    Marliss Czar, MD    Rise Patience, MD  Resident  10/30/11 1191    Rise Patience, MD  Resident  10/30/11 [phone]      Resident Attestation:     Patient seen by me today, 10/30/2011 at 0430    History:   I reviewed this patient, reviewed the resident's note and agree, with edits as above.  Exam:   I examined this patient, reviewed the resident's note and agree, with edits as above.    Decision Making:   I discussed with the resident his/her documented decision making  and agree, with edits as above.      Author Marliss Czar, MD      Marliss Czar, MD  10/30/11 [phone]

## 2011-10-30 NOTE — ED Notes (Addendum)
Pt c/o headache, was seen and discharged after staying two nights in OBS for seizures. Pt also c/o dizziness

## 2011-10-30 NOTE — ED Notes (Signed)
Patient to X-ray

## 2011-10-30 NOTE — Discharge Instructions (Signed)
You were seen in the emergency department for dizziness, nausea, vomiting, and diarrhea. You do not have meningitis. The dizziness may be related to dehydration. You were prescribed zofran and tylenol, which you should take only as prescribed.    Your potassium is low. You should eat potassium rich foods, like bananas to increase this.    As always when coming to the emergency department, please follow up with your primary care provider in 3-5 days. Call first thing in the morning for an appointment.    Please return to the Emergency Department if you experience any fever, chills, worsening pain not controlled with medications, chest pain, shortness of breath, nausea, vomiting, or any other worsening or concerning symptoms.    Gastroenteritis  (Vomiting, Diarrhea and/or Dehydration)     Viral gastroenteritis is also known as stomach flu.  Diarrhea and vomiting are often due to a virus. It affects the stomach and intestinal tract. These illnesses typically last 3 to 8 days.  Medications which kill germs (antibiotics) will not affect the course of the illness unless there is also a germ (bacterial) infection.   Most people develop an immune response. This eventually gets rid of the virus. While this natural response develops, the virus can make you quite ill. The majority of people affected are young infants. So the disease can be dangerous. The most common symptom is diarrhea. This alone can cause severe dehydration and electrolyte imbalance. Treatments for this illness are supportive. They are aimed at rehydration. Anti-diarrheal medicines are not recommended. They do not decrease diarrhea volume and may be harmful. Usually home treatment is all that is needed     Infants, children and adults lose electrolytes and water with their diarrhea. Fluids that have the right amount of electrolytes (salts) and sugar need to be used for oral rehydration. Sugar is needed for 2 reasons:   To give some calories.   More  importantly, to help transport sodium (an electrolyte) across the bowel wall into the blood stream.   Many commercial rehydration solutions on the market will do this. Many oral rehydration solutions (ORS) are similar. Ask your pharmacist if you have questions about which ORS to buy.     TREATING INFANTS  Infants need to be treated with 2 liquids:   Oral rehydration solution.   Calories and nutrition from formula or breast milk.  Oral rehydration solutions will not provide enough calories. Infants must have formula or breast milk  as well. Caregivers no longer recommend diluting formula during rehydration.     TREATING CHILDREN AND ADULTS   Children may not want to drink a flavored version of an ORS. Parents may have to use sports drinks. These are not ideal. But they are better than fruit juices.    Toddlers and children need extra calories and nutrition. Give them an age appropriate diet with recommended foods. These include:  l Complex carbohydrates.  l Meats. l Yogurts.  l Fruits and vegetables.    Adults should be treated the same way as children.  When a child or adult vomits or has diarrhea, 4 to 8 ounces of ORS may be given to replace the estimated loss.     USE OF ORT OR IV (INTRAVENOUS ) FLUIDS   ORT has several practical advantages over IVs. They:  l Are less scary (traumatic) to the child.  l Are easier to use.  l Can be given at home.  ORT is recommended as the first choice in treating dehydration for  all children with mild to moderate dehydration due to diarrhea by:  l The American Academy of Pediatrics (AAP).  l The Centers for Disease Control (CDC).   Still ORT is not used as often as it could be. There are many possible reasons for this:  l Many insurance providers do not cover ORT.  l Commercial ORT is expensive. It ranges from $2-$9 per liter.  l Misunderstanding of the relative benefits of ORT.   The most serious cases are when patients are vomiting so severely that they are not able to  keep down fluids taken orally. In these cases, they need IV fluids. Vomiting with viral gastroenteritis is common. But it will usually go away as the patient is treated.     SEEK IMMEDIATE MEDICAL CARE IF:   Urination decreases.   A dry mouth, tongue or lips develop.   You notice sunken eyes or decreased tears.   Skin becomes dry.   You notice fast breathing.   Your child is fussy or floppy.   Skin has poor color or is pale.   There is blood in the vomit or stool.   The abdomen becomes distended or tender.   Prolonged capillary refill develops. This is the time it takes the fingertip to turn pink again after a gentle squeeze. Abnormal is greater than 2 seconds.   There is persistent vomiting or high output diarrhea.     Fever of 100.4.     Some of this information is courtesy of the Center for Disease Control and Prevention of Food Illness Fact Sheet.           Document Released: 07/11/2005  Document Re-Released: 10/07/2008  [facility] Patient Information 2011 [place], [place].    Hypokalemia     Hypokalemia means a low potassium level in the blood. Symptoms are weakness, fatigue, or irregularities of the heartbeat. Sometimes hypokalemia is discovered by your caregiver if you are taking certain medicines for high blood pressure or kidney disease.      Potassium is an electrolyte that helps regulate the amount of fluid in the body. It also stimulates muscle contraction and maintains a stable acid-base balance. If potassium levels go too low or too high, your health may be in danger. You are at risk for developing shock, heart and lung problems. Hypokalemia can occur if you have excessive diarrhea, vomiting, or sweating. Potassium can be lost through your kidneys in the urine. Certain common medicines can also cause potassium loss, especially water pills (diuretics). The same is possible with cortisone-type medication or certain types of antibiotics. Low potassium can be dangerous if you are taking certain  heart medicines (such as Digoxin). In diabetes, your potassium may fall after you take insulin, especially if your diabetes had been out of control for a while. In rare cases, potassium may be low because you are not getting enough in your diet.      Potassium levels below 3.5 mEq/L are abnormal. Record your level today.     Hypokalemia can be treated with potassium supplements taken by mouth and a diet that is high in potassium. Foods with high potassium content are:   Peas, lentils, lima beans, nuts, and dried fruit.   Whole grain and bran cereals and breads.   Fresh fruit and vegetables. Examples include:  l Bananas.  l Cantaloupe.  l Grapefruit.  l Oranges. l  Tomatoes.  l  Honeydew melons.  l  Potatoes.    Orange and tomato juices.  Meats.     See your caregiver as instructed for a follow-up blood test to be sure your potassium is back to normal.     Document Released: 07/11/2005  Document Re-Released: 10/07/2008  [facility] Patient Information 2011 [place], [place].

## 2011-10-30 NOTE — ED Notes (Signed)
Patient not tolerating LP well. Patient given escalated dosage of Fentanyl per provider. Patient monitored for respiratory suppression.

## 2011-10-30 NOTE — ED Notes (Signed)
Patient placed on continuous oximetry to monitor SpO2.

## 2011-10-30 NOTE — ED Notes (Signed)
Patient presents to the ED with c/o weakness, dizziness, squeezing HA, fevers/chills, n/v/d and blurred vision since 1600 yesterday. Patient was discharged from the observation unit at 1600, went home and noted no relief in his sx so he came back to ED. Patient denies having any seizures since discharge. Patient has no other complaints at this time. Will continue to provide treatment and care per orders.

## 2011-10-30 NOTE — ED Notes (Signed)
Patient receiving LP by MD Campbell Lerner and Delton See. Patient medicated with 50 mcg of Fentanyl per MD Hermelinda Medicus verbal order. 50 mcg of Fentanyl wasted with M. Puls RN.

## 2011-10-30 NOTE — ED Notes (Signed)
Labs reviewed.

## 2011-10-30 NOTE — ED Provider Progress Notes (Signed)
ED Provider Progress Note     Reviewed labs, no sign of viral or bacterial meningitis in CSF. Patient states he feels better, now only dizzy. Tolerating PO fluids. No further vomiting in the ED. Will discharge with a prescription of zofran for his nausea. Patient instructed to follow up with PCP.      Quillian Quince, MD, 10/30/2011, 10:52 AM

## 2011-10-31 LAB — EKG 12-LEAD
P: 42 degrees
P: 50 degrees
QRS: 101 degrees
QRS: 70 degrees
Rate: 108 {beats}/min
Rate: 77 {beats}/min
Severity: BORDERLINE
Severity: BORDERLINE
Severity: NORMAL
Severity: NORMAL
Statement: BORDERLINE
T: 5 degrees
T: 16 degrees

## 2011-11-02 LAB — AEROBIC CULTURE: Aerobic Culture: 0

## 2013-07-11 ENCOUNTER — Encounter: Payer: Self-pay | Admitting: Family Medicine

## 2013-07-11 ENCOUNTER — Observation Stay
Admission: EM | Admit: 2013-07-11 | Discharge status: Against Medical Advice | Payer: Self-pay | Source: Ambulatory Visit | Attending: Emergency Medicine | Admitting: Emergency Medicine

## 2013-07-11 LAB — CBC AND DIFFERENTIAL
Baso # K/uL: 0 10*3/uL (ref 0.0–0.1)
Basophil %: 0.5 % (ref 0.2–1.2)
Eos # K/uL: 0.3 10*3/uL (ref 0.0–0.5)
Eosinophil %: 3.6 % (ref 0.8–7.0)
Hematocrit: 50 % (ref 40–51)
Hemoglobin: 18.2 g/dL — ABNORMAL HIGH (ref 13.7–17.5)
Lymph # K/uL: 3.3 10*3/uL (ref 1.3–3.6)
Lymphocyte %: 39.1 % (ref 21.8–53.1)
MCH: 33 pg/cell — ABNORMAL HIGH (ref 26–32)
MCHC: 36 g/dL (ref 32–37)
MCV: 90 fL (ref 79–92)
Mono # K/uL: 0.7 10*3/uL (ref 0.3–0.8)
Monocyte %: 7.6 % (ref 5.3–12.2)
Neut # K/uL: 4.2 10*3/uL (ref 1.8–5.4)
Platelets: 186 10*3/uL (ref 150–330)
RBC: 5.6 MIL/uL (ref 4.6–6.1)
RDW: 12.7 % (ref 11.6–14.4)
Seg Neut %: 49.2 % (ref 34.0–67.9)
WBC: 8.5 10*3/uL (ref 4.2–9.1)

## 2013-07-11 LAB — RUQ PANEL (ED ONLY)
ALT: 65 U/L — ABNORMAL HIGH (ref 0–50)
AST: 41 U/L (ref 0–50)
Albumin: 4.3 g/dL (ref 3.5–5.2)
Alk Phos: 73 U/L (ref 40–130)
Amylase: 97 U/L (ref 28–100)
Bilirubin,Direct: <0.2 mg/dL (ref 0.0–0.3)
Bilirubin,Total: 0.4 mg/dL (ref 0.0–1.2)
Lipase: 24 U/L (ref 13–60)
Total Protein: 7.2 g/dL (ref 6.3–7.7)

## 2013-07-11 LAB — PLASMA PROF 7 (ED ONLY)
Anion Gap,PL: 12 (ref 7–16)
CO2,Plasma: 21 mmol/L (ref 20–28)
Chloride,Plasma: 106 mmol/L (ref 96–108)
Creatinine: 0.77 mg/dL (ref 0.67–1.17)
GFR,Black: 133 *
GFR,Caucasian: 115 *
Glucose,Plasma: 93 mg/dL (ref 60–99)
Potassium,Plasma: 3.8 mmol/L (ref 3.4–4.7)
Sodium,Plasma: 139 mmol/L (ref 132–146)
UN,Plasma: 12 mg/dL (ref 6–20)

## 2013-07-11 LAB — HOLD BLUE

## 2013-07-11 MED ORDER — HYDROMORPHONE HCL 2 MG/ML IJ SOLN *WRAPPED*
1.0000 mg | INTRAMUSCULAR | Status: DC | PRN
Start: 2013-07-11 — End: 2013-07-12
  Administered 2013-07-12: 1 mg via INTRAVENOUS
  Filled 2013-07-11: qty 1

## 2013-07-11 MED ORDER — ONDANSETRON HCL 2 MG/ML IV SOLN *I*
4.0000 mg | INTRAMUSCULAR | Status: DC | PRN
Start: 2013-07-11 — End: 2013-07-12
  Administered 2013-07-12: 4 mg via INTRAVENOUS
  Filled 2013-07-11: qty 2

## 2013-07-11 MED ORDER — SODIUM CHLORIDE 0.9 % IV BOLUS *I*
1000.0000 mL | Freq: Once | Status: AC
Start: 2013-07-11 — End: 2013-07-12

## 2013-07-11 MED ADMIN — Sodium Chloride IV Soln 0.9%: 1000 mL | INTRAVENOUS | NDC 00338004904

## 2013-07-11 NOTE — First Provider Contact (Signed)
ED Medical Screening Exam Note    Initial provider evaluation performed by   ED First Provider Contact    Date/Time Event User Comments    07/11/13 1835 ED Provider First Contact Maevyn Riordan A Initial Face to Face Provider Contact          Right sided abd pain for a few days/ reports that he noted some blood in stool      Orders placed:  LABS     Patient requires further evaluation.     Eden Rho, PA, 07/11/2013, 6:35 PM

## 2013-07-11 NOTE — ED Notes (Signed)
Pt to ED with 10/10 right sided abdominal pain that radiates to right flank. Pt says that that his urine is darker and yesterday he notices BRB in his stool. Stool was of "normal" consistency and pt is not on anticoagulation therapy. Pt endorses mild nausea but denies vomiting or diarrhea.  Pt has taken ibuprofen for pain with little effect. Pt also complaining of a headache which is attributes to "high blood pressure." Pt has been out of his BP medication for a month. Will continue to monitor and treat per orders.

## 2013-07-11 NOTE — ED Notes (Signed)
C/o right sided abdominal pain x 2 days. States that pain radiates around to his back.

## 2013-07-12 ENCOUNTER — Encounter: Payer: Self-pay | Admitting: Emergency Medicine

## 2013-07-12 LAB — CK: CK: 180 U/L — ABNORMAL HIGH (ref 46–171)

## 2013-07-12 LAB — BASIC METABOLIC PANEL
Anion Gap: 7 (ref 7–16)
CO2: 26 mmol/L (ref 20–28)
Calcium: 8.2 mg/dL — ABNORMAL LOW (ref 9.0–10.3)
Chloride: 108 mmol/L (ref 96–108)
Creatinine: 0.92 mg/dL (ref 0.67–1.17)
GFR,Black: 121 *
GFR,Caucasian: 105 *
Glucose: 97 mg/dL (ref 60–99)
Lab: 10 mg/dL (ref 6–20)
Potassium: 4.1 mmol/L (ref 3.3–5.1)
Sodium: 141 mmol/L (ref 133–145)

## 2013-07-12 LAB — RUQ PANEL (ED ONLY)
ALT: 54 U/L — ABNORMAL HIGH (ref 0–50)
AST: 38 U/L (ref 0–50)
Albumin: 3.8 g/dL (ref 3.5–5.2)
Alk Phos: 61 U/L (ref 40–130)
Amylase: 85 U/L (ref 28–100)
Bilirubin,Direct: <0.2 mg/dL (ref 0.0–0.3)
Bilirubin,Total: 0.4 mg/dL (ref 0.0–1.2)
Lipase: 24 U/L (ref 13–60)
Total Protein: 6.3 g/dL (ref 6.3–7.7)

## 2013-07-12 LAB — INFLUENZA  A & B/RSV PCR: Influenza A&B/RSV PCR: 0

## 2013-07-12 MED ORDER — PROMETHAZINE HCL 25 MG/ML IJ SOLN *I*
12.5000 mg | Freq: Four times a day (QID) | INTRAMUSCULAR | Status: DC | PRN
Start: 2013-07-12 — End: 2013-07-12

## 2013-07-12 MED ORDER — PROCHLORPERAZINE EDISYLATE 5 MG/ML IJ SOLN WRAPPED *I*
10.0000 mg | Freq: Once | INTRAMUSCULAR | Status: AC
Start: 2013-07-12 — End: 2013-07-12
  Administered 2013-07-12: 10 mg via INTRAVENOUS
  Filled 2013-07-12: qty 2

## 2013-07-12 MED ORDER — AMLODIPINE BESYLATE 10 MG PO TABS *I*
10.0000 mg | ORAL_TABLET | Freq: Once | ORAL | Status: AC
Start: 2013-07-12 — End: 2013-07-12
  Administered 2013-07-12: 10 mg via ORAL
  Filled 2013-07-12: qty 1

## 2013-07-12 MED ORDER — CHLORTHALIDONE 25 MG PO TABS *I*
25.0000 mg | ORAL_TABLET | Freq: Every day | ORAL | Status: AC
Start: 2013-07-12 — End: 2013-08-11

## 2013-07-12 MED ORDER — AMLODIPINE BESYLATE 10 MG PO TABS *I*
10.0000 mg | ORAL_TABLET | Freq: Every day | ORAL | Status: DC
Start: 2013-07-12 — End: 2013-07-12

## 2013-07-12 MED ORDER — ONDANSETRON HCL 4 MG PO TABS *I*
4.0000 mg | ORAL_TABLET | Freq: Three times a day (TID) | ORAL | Status: DC | PRN
Start: 2013-07-12 — End: 2020-06-05

## 2013-07-12 MED ORDER — DIPHENHYDRAMINE HCL 25 MG PO TABS *I*
25.0000 mg | ORAL_TABLET | Freq: Every evening | ORAL | Status: DC | PRN
Start: 2013-07-12 — End: 2013-07-12

## 2013-07-12 MED ORDER — PANTOPRAZOLE SODIUM 40 MG IV SOLR *I*
40.0000 mg | INTRAVENOUS | Status: DC
Start: 2013-07-12 — End: 2013-07-12
  Administered 2013-07-12: 40 mg via INTRAVENOUS
  Filled 2013-07-12: qty 10

## 2013-07-12 MED ORDER — CHLORTHALIDONE 25 MG PO TABS *I*
25.0000 mg | ORAL_TABLET | Freq: Every day | ORAL | Status: DC
Start: 2013-07-12 — End: 2013-07-12
  Administered 2013-07-12: 25 mg via ORAL
  Filled 2013-07-12: qty 1

## 2013-07-12 MED ORDER — SODIUM CHLORIDE 0.9 % IV BOLUS *I*
1000.0000 mL | Freq: Once | Status: AC
Start: 2013-07-12 — End: 2013-07-12
  Administered 2013-07-12: 1000 mL via INTRAVENOUS

## 2013-07-12 MED ORDER — SODIUM CHLORIDE 0.9 % IV SOLN WRAPPED *I*
100.0000 mL/h | Status: DC
Start: 2013-07-12 — End: 2013-07-12
  Administered 2013-07-12: 100 mL/h via INTRAVENOUS

## 2013-07-12 MED ORDER — AMLODIPINE BESYLATE 10 MG PO TABS *I*
10.0000 mg | ORAL_TABLET | Freq: Every day | ORAL | Status: DC
Start: 2013-07-12 — End: 2020-06-05

## 2013-07-12 NOTE — Discharge Instructions (Signed)
Take your medications as prescribed.    Follow up with the medicine clinic early next week or return to the emergency room day or night for fevers, chills, worsening pain, uncontrolled nausea/vomiting, continued headache, uncontrolled blood pressure.     You are leaving against medical advice as your symptoms of Headache and elevated Blood pressure are not resolved.     Return to the ER for worsening symptoms.

## 2013-07-12 NOTE — ED Obs Notes (Signed)
ED OBSERVATION ADMISSION NOTE    Patient seen by me today, 07/12/2013 at 3:45 AM    Current patient status: Observation    History     Chief Complaint   Patient presents with   . Other     Right flank pain     HPI Comments: Pt is a 37yo M with hx of HTN, obesity, tobacco use, s/p chole, placed in OBS following eval in the ED for c/o abd pain, flank pain and nausea with HA. Pt states symptoms have been present over the past 2 1/2 days. He notes abd pain is nearly constant, not related to eating. He notes poor appetite, nausea but No vomiting, no diarrhea. Notes nml stool consistency but states some blood PR with last BM yesterday. Similar pain 2 years ago when he had his gallbladder removed. HA is without visual changes, no NT or speech or gait difficulty. Also reports he has been off his antihypertensive meds for past 5-6 weeks as he has not had a provider to prescribe, and is requesting Rx and referral for PCP. Initial work up in the ED including labs CT a/p are unremarkable. He does not feel well enough to go home and thus is placed in OBS for ongoing supportive care.        History provided by:  Patient      Past Medical History   Diagnosis Date   . Coronary artery disease    . Obesity    . Psychiatric disorder    . Hypertension        Past Surgical History   Procedure Laterality Date   . Cholecystectomy     . Wrist surgery         Family History   Problem Relation Age of Onset   . Migraines Mother        Social History      reports that he quit smoking about 20 months ago. His smoking use included Cigarettes. He smoked 0.00 packs per day. He has never used smokeless tobacco. He reports that he does not drink alcohol, use illicit drugs, or engage in sexual activity.    Living Situation    Questions Responses    Patient lives with Family    Comment:  sister in law and niece     Homeless No    Caregiver for other family member No    External Services None    Employment Unemployed    Domestic Violence Risk No           Review of Systems   Review of Systems   Constitutional: Positive for appetite change and fatigue. Negative for fever, chills and diaphoresis.   HENT: Negative for congestion, sore throat, rhinorrhea, trouble swallowing, neck pain and neck stiffness.    Respiratory: Negative for choking, chest tightness and shortness of breath.    Cardiovascular: Negative for chest pain, palpitations and leg swelling.   Gastrointestinal: Positive for nausea, abdominal pain and blood in stool. Negative for vomiting, diarrhea and rectal pain.   Genitourinary: Positive for flank pain. Negative for dysuria, urgency and frequency.   Musculoskeletal: Positive for myalgias. Negative for back pain and gait problem.   Skin: Negative for color change and pallor.   Neurological: Positive for headaches. Negative for dizziness, syncope, weakness, light-headedness and numbness.   Hematological: Negative.    Psychiatric/Behavioral: Negative.        Physical Exam   BP 145/96  Pulse 65  Temp(Src) 36.8 C (98.2 F) (  Temporal)  Resp 16  Ht 1.854 m (6' 0.99")  Wt 127.007 kg (280 lb)  BMI 36.95 kg/m2  SpO2 98%    Physical Exam   Constitutional: He is oriented to person, place, and time. He appears well-developed and well-nourished.   Ill appearing but non-toxic     HENT:   Head: Normocephalic and atraumatic.   Dry mucous membranes     Eyes: Conjunctivae are normal. No scleral icterus.   Neck: Neck supple.   Pulmonary/Chest: Effort normal. He has no decreased breath sounds. He has no wheezes. He has rhonchi in the right lower field. He has no rales.   Abdominal: Soft. Bowel sounds are normal. He exhibits no distension. There is tenderness in the right upper quadrant and epigastric area. There is no rigidity, no rebound, no guarding and no CVA tenderness.   Musculoskeletal: Normal range of motion. He exhibits no edema.   Neurological: He is alert and oriented to person, place, and time.   Skin: Skin is warm and dry.   Psychiatric: He has a  normal mood and affect.       Tests     Labs:   All labs in the last 24 hours:   Recent Results (from the past 24 hour(s))   CBC AND DIFFERENTIAL    Collection Time     07/11/13  9:29 PM       Result Value Range    WBC 8.5  4.2 - 9.1 THOU/uL    RBC 5.6  4.6 - 6.1 MIL/uL    Hemoglobin 18.2 (*) 13.7 - 17.5 g/dL    Hematocrit 50  40 - 51 %    MCV 90  79 - 92 fL    MCH 33 (*) 26 - 32 pg/cell    MCHC 36  32 - 37 g/dL    RDW 16.1  09.6 - 04.5 %    Platelets 186  150 - 330 THOU/uL    Seg Neut % 49.2  34.0 - 67.9 %    Lymphocyte % 39.1  21.8 - 53.1 %    Monocyte % 7.6  5.3 - 12.2 %    Eosinophil % 3.6  0.8 - 7.0 %    Basophil % 0.5  0.2 - 1.2 %    Neut # K/uL 4.2  1.8 - 5.4 THOU/uL    Lymph # K/uL 3.3  1.3 - 3.6 THOU/uL    Mono # K/uL 0.7  0.3 - 0.8 THOU/uL    Eos # K/uL 0.3  0.0 - 0.5 THOU/uL    Baso # K/uL 0.0  0.0 - 0.1 THOU/uL   PLASMA PROF 7 (ED ONLY)    Collection Time     07/11/13  9:29 PM       Result Value Range    Chloride,Plasma 106  96 - 108 mmol/L    CO2,Plasma 21  20 - 28 mmol/L    Potassium,Plasma 3.8  3.4 - 4.7 mmol/L    Sodium,Plasma 139  132 - 146 mmol/L    Anion Gap 12  7 - 16    UN,Plasma 12  6 - 20 mg/dL    Creatinine,Plasma 4.09  0.67 - 1.17 mg/dL    GFR,Caucasian 811      GFR,Black 133      Glucose,Plasma 93  60 - 99 mg/dL   RUQ PANEL (ED ONLY)    Collection Time     07/11/13  9:29 PM       Result Value  Range    Amylase 97  28 - 100 U/L    Lipase 24  13 - 60 U/L    Total Protein 7.2  6.3 - 7.7 g/dL    Albumin 4.3  3.5 - 5.2 g/dL    Bilirubin,Total 0.4  0.0 - 1.2 mg/dL    Bilirubin,Direct <9.6  0.0 - 0.3 mg/dL    Alk Phos 73  40 - 045 U/L    AST 41  0 - 50 U/L    ALT 65 (*) 0 - 50 U/L   CK    Collection Time     07/11/13  9:29 PM       Result Value Range    CK 180 (*) 46 - 171 U/L   HOLD BLUE    Collection Time     07/11/13  9:37 PM       Result Value Range    Hold Blue HOLD TUBE          Imaging:Ct Abdomen And Pelvis Without Contrast    07/12/2013   IMPRESSION: No hydronephrosis or radiopaque renal  calculi.   END REPORT     I have personally reviewed the image(s) and the resident's  interpretation and agree with or edited the findings.           Medical Decision Making   <EDMDM>    Assessment:  37 y.o., male with hx of HTN, obesity, tobacco use, s/p chole, placed in OBS after evaluation in the ED for  Complaints of general malaise, abd/flank pain and HA. Labs are unremarkable, CT a/p is without eveidence of intrabdominal or renal pathology. He has tenderness in RUQ and rhonci in R lung base, and he appears dry on exam. He reports non-compliance with his antihypertensives x 1 month. He is placed in OBS for symptom control.     Differential Diagnosis includes Viral syndrome, gastroenteritis, UTI (awaiting urine), dehydration, hypertension       Plan:   1. abd pain, nausea, HA:  - IVF, antiemetics  - pt received compazine 10mg  x1 in ED.   - avoiding narcotics  - benadryl to sleep per pt request.   - PO challenge.   - BP control   - follow up on UA.     2. HTN:  - previously on Chlorthalidone 25mg , Toprol 50mg  and norvasc 10mg .   - was given 10mg  norvasc in ED, will continue this and add Chlorthalidone 25mg . Will defer toprol for now as concern for dropping BP too precipitously as pt has been non-compliant for some time. If persistently elevated will consider resuming all meds.   - Pt will need RX on discharge.     3. Dispo:  - pt will need referral to Med clinic for follow up on d/c. He no longer has PCP.       Medically preferred DVT prophylaxis: None      Zuriel Yeaman Lowella Dandy, NP    Supervising physician Dr Daine Floras was immediately available

## 2013-07-12 NOTE — ED Notes (Signed)
Report called to OBS

## 2013-07-12 NOTE — ED Provider Notes (Addendum)
History     Chief Complaint   Patient presents with   . Other     Right flank pain     HPI Comments: 37 yr old M presents with R flank pain, myalgias, HA and fatigue. He states he got a flu shot. He also think his urine is darker, but a triage abd CT is neg for nephrolithiasis or other intra-abd disease. He hasn't been vomiting but feels dehydrated from poor PO intake. The patient denies fever, trauma, chest pain, palpitations, shortness of breath, diarrhea, headache, confusion, neck stiffness, back pain, edema, hematuria, dysuria, frequency, or decreased urine output.    He also states he's been out of his daily PO 10mg  amlodipine.        History provided by:  Patient      Past Medical History   Diagnosis Date   . Coronary artery disease    . Obesity    . Psychiatric disorder    . Hypertension             Past Surgical History   Procedure Laterality Date   . Cholecystectomy     . Wrist surgery         Family History   Problem Relation Age of Onset   . Migraines Mother          Social History      reports that he quit smoking about 3 weeks ago. His smoking use included Cigarettes. He smoked 0.00 packs per day. He has never used smokeless tobacco. He reports that he does not drink alcohol, use illicit drugs, or engage in sexual activity.    Living Situation    Questions Responses    Patient lives with Family    Comment:  sister in law and niece     Homeless No    Caregiver for other family member No    External Services None    Employment Unemployed    Domestic Violence Risk No          Problem List     Patient Active Problem List   Diagnosis Code   . Coronary artery disease 414.00   . Obesity 278.00   . Psychiatric disorder 300.9   . Hypertension 401.9       Review of Systems   Review of Systems   Constitutional: Positive for fatigue. Negative for fever.   HENT: Negative for congestion.    Eyes: Negative for redness.   Respiratory: Negative for cough.    Cardiovascular: Negative for chest pain.    Gastrointestinal: Positive for nausea and abdominal pain.   Genitourinary: Negative for decreased urine volume.   Musculoskeletal: Negative for back pain.   Skin: Negative for pallor.   Neurological: Positive for headaches. Negative for syncope.   Psychiatric/Behavioral: Negative for confusion.       Physical Exam       ED Triage Vitals   BP Heart Rate Heart Rate(via Pulse Ox) Resp Temp Temp Source SpO2 O2 Device O2 Flow Rate   07/11/13 1836 07/11/13 1836 -- 07/11/13 1836 07/11/13 1836 07/11/13 1836 07/11/13 1836 07/11/13 2135 --   196/105 mmHg 72  16 36.7 C (98.1 F) TEMPORAL 99 % None (Room air)       Weight           07/11/13 1836           127.007 kg (280 lb)               Physical Exam  Nursing note and vitals reviewed.  Constitutional: He is oriented to person, place, and time.   HENT:   Head: Atraumatic.   Eyes: No scleral icterus.   Neck:   The patient has full range of motion of the neck: left to right, up and down. There is no nuchal rigidity or signs of meningismus.      Cardiovascular: Normal rate, normal heart sounds and intact distal pulses.    Pulmonary/Chest: No stridor. No respiratory distress.   Abdominal: Soft. There is no tenderness.   Mild RUQ tenderness  Scars from prior chole  Benign abd otherwise   Musculoskeletal: He exhibits no tenderness.   Neurological: He is alert and oriented to person, place, and time.   Skin: He is not diaphoretic. No pallor.   Psychiatric: His behavior is normal.       Medical Decision Making      Amount and/or Complexity of Data Reviewed  Clinical lab tests: ordered and reviewed  Tests in the radiology section of CPT: reviewed        Initial Evaluation:  ED First Provider Contact    Date/Time Event User Comments    07/11/13 1835 ED Provider First Contact CHILELLI, TIMOTHY A Initial Face to Face Provider Contact          Patient seen by me as above    Assessment:  37 y.o., male comes to the ED with likely a viral syndrome causing myalgias, HA, dehydration and  fatigue. He has normal PE, labs and CT. He appears to be dehydrated. He would benefit from more IVF and compazine and to be watched by obs overnight for symptom control. He'll need a new script for 10mg  amlodipine, and I'll give him a dose here. RUQ [place] isn't helpful because he has a hx of chole.    Differential Diagnosis includes as above, renal colic, rhabdomyolysis,  Dehydration, viral syndrome, influenza             Plan:    Insert peripheral IV   Administer intravenous normal saline   Ordered and reviewed labs that were ordered based on the above assessment and differential diagnoses   Admission to obs   Compazine 10mg  IV for nausea/HA   Reviewed CT a/p - normal, no nephrolithiasis   10mg  amlodipine         Colan Neptune, MD          Colan Neptune, MD  Resident  07/12/13 0206    Resident Attestation:     Patient seen by me today, 07/12/2013 at approx 0211    History:   I reviewed this patient, reviewed the resident's note and agree, with edits as above.  Exam:   I examined this patient, reviewed the resident's note and agree, with edits as above.    Decision Making:   I discussed with the resident his/her documented decision making  and agree, with edits as above.      Author Coral Else, MD      Coral Else, MD  07/12/13 [phone]

## 2013-07-12 NOTE — ED Notes (Signed)
Pt arrived to obs 1 via stretcher.  Ambulated to bed independently with steady gait.  Alert and oriented X4.  Oriented to room and call bell.  Pt reports his headache has improved after receiving medications in the ED.  He denies nausea at this time.  Pt reports continued RUQ abdominal pain.  Flu swab was taken in ED.  Pt aware he has to wear a mask when he leaves his room.  No other complaints at this time.  Writer will continue to monitor and treat per orders.  Plan of Care:  IVF's, anti-emetics prn, urine sample, AM labs, monitor BP, orthostatics with heart rate, medication administration, comfort measures, droplet precaution until flu swab results.

## 2013-07-12 NOTE — ED Notes (Signed)
Pt placed call bell on and reports that he needs to leave because his father was in a bad car accident.  Provider notified.

## 2014-06-14 ENCOUNTER — Other Ambulatory Visit: Payer: Self-pay | Admitting: Gastroenterology

## 2014-06-14 ENCOUNTER — Emergency Department
Admission: EM | Admit: 2014-06-14 | Disposition: A | Discharge status: Home / Self Care | Payer: Self-pay | Source: Ambulatory Visit | Attending: Emergency Medicine | Admitting: Emergency Medicine

## 2014-06-14 NOTE — First Provider Contact (Signed)
ED Medical Screening Exam Note    Initial provider evaluation performed by   ED First Provider Contact     Date/Time Event User Comments    06/14/14 2328 ED Provider First Contact VENTELLO, Elsey Holts K Initial Face to Face Provider Contact        38 yo male who presents to the ED tonight with right sided calf pain that is radiating up the right side of his body. Additionally he states that he began vomiting blood last night, and had one episode of bloody stool tonight after work around 9 pm. Pt states he has a history of HTN, for which he has run out of his medication for. States he has been out for over one month. Pt endorses migraine, blurry vision. In triage BP 233/144. Pt endorses SOB. Lungs clear, but diminished on exam.     Vital signs reviewed.    Orders placed:  EKG and LABS     Patient requires further evaluation.     Vern Claudehristine K Ventello, NP, 06/14/2014, 11:28 PM     Supervising physician Rada HayCummins was immediately available

## 2014-06-14 NOTE — ED Notes (Signed)
Patient has multiple complaints:   On Thursday had right calf pain   Bilateral Leg Swelling   Right sided body pain   Congestion   N/V States twice he has thrown up blood.    SOB

## 2014-06-15 ENCOUNTER — Encounter: Payer: Self-pay | Admitting: Emergency Medicine

## 2014-06-15 LAB — RUQ PANEL (ED ONLY)
ALT: 128 U/L — ABNORMAL HIGH (ref 0–50)
AST: 84 U/L — ABNORMAL HIGH (ref 0–50)
Albumin: 4.3 g/dL (ref 3.5–5.2)
Alk Phos: 88 U/L (ref 40–130)
Amylase: 123 U/L — ABNORMAL HIGH (ref 28–100)
Bilirubin,Direct: <0.2 mg/dL (ref 0.0–0.3)
Bilirubin,Total: 0.4 mg/dL (ref 0.0–1.2)
Lipase: 25 U/L (ref 13–60)
Total Protein: 7 g/dL (ref 6.3–7.7)

## 2014-06-15 LAB — PLASMA PROF 7 (ED ONLY)
Anion Gap,PL: 19 — ABNORMAL HIGH (ref 7–16)
CO2,Plasma: 20 mmol/L (ref 20–28)
Chloride,Plasma: 103 mmol/L (ref 96–108)
Creatinine: 0.94 mg/dL (ref 0.67–1.17)
GFR,Black: 117 *
GFR,Caucasian: 101 *
Glucose,Plasma: 87 mg/dL (ref 60–99)
Potassium,Plasma: 3.8 mmol/L (ref 3.4–4.7)
Sodium,Plasma: 142 mmol/L (ref 132–146)
UN,Plasma: 15 mg/dL (ref 6–20)

## 2014-06-15 LAB — CBC AND DIFFERENTIAL
Baso # K/uL: 0 10*3/uL (ref 0.0–0.1)
Basophil %: 0.4 %
Eos # K/uL: 0.1 10*3/uL (ref 0.0–0.5)
Eosinophil %: 1.3 %
Hematocrit: 50 % (ref 40–51)
Hemoglobin: 17.6 g/dL — ABNORMAL HIGH (ref 13.7–17.5)
IMM Granulocytes #: 0 10*3/uL (ref 0.0–0.1)
IMM Granulocytes: 0.3 %
Lymph # K/uL: 2.8 10*3/uL (ref 1.3–3.6)
Lymphocyte %: 28.2 %
MCH: 33 pg/cell — ABNORMAL HIGH (ref 26–32)
MCHC: 36 g/dL (ref 32–37)
MCV: 91 fL (ref 79–92)
Mono # K/uL: 1.1 10*3/uL — ABNORMAL HIGH (ref 0.3–0.8)
Monocyte %: 11 %
Neut # K/uL: 5.8 10*3/uL — ABNORMAL HIGH (ref 1.8–5.4)
Nucl RBC # K/uL: 0 10*3/uL (ref 0.0–0.0)
Nucl RBC %: 0 /100 WBC (ref 0.0–0.2)
Platelets: 181 10*3/uL (ref 150–330)
RBC: 5.4 MIL/uL (ref 4.6–6.1)
RDW: 12.5 % (ref 11.6–14.4)
Seg Neut %: 58.8 %
WBC: 9.8 10*3/uL — ABNORMAL HIGH (ref 4.2–9.1)

## 2014-06-15 LAB — HOLD SST

## 2014-06-15 LAB — CK: CK: 310 U/L — ABNORMAL HIGH (ref 46–171)

## 2014-06-15 LAB — HOLD GRAY

## 2014-06-15 LAB — HOLD BLUE

## 2014-06-15 MED ORDER — MORPHINE SULFATE 4 MG/ML IJ SOLN
4.0000 mg | Freq: Once | INTRAMUSCULAR | Status: AC
Start: 2014-06-15 — End: 2014-06-15
  Administered 2014-06-15: 4 mg via INTRAVENOUS
  Filled 2014-06-15: qty 1

## 2014-06-15 MED ORDER — SODIUM CHLORIDE 0.9 % IV BOLUS *I*
1000.0000 mL | Freq: Once | Status: AC
Start: 2014-06-15 — End: 2014-06-15
  Administered 2014-06-15: 1000 mL via INTRAVENOUS

## 2014-06-15 MED ORDER — NICOTINE POLACRILEX 4 MG MT GUM *I*
4.0000 mg | CHEWING_GUM | OROMUCOSAL | Status: AC | PRN
Start: 2014-06-15 — End: 2014-07-15

## 2014-06-15 MED ORDER — KETOROLAC TROMETHAMINE 30 MG/ML IJ SOLN *I*
30.0000 mg | Freq: Once | INTRAMUSCULAR | Status: AC
Start: 2014-06-15 — End: 2014-06-15
  Administered 2014-06-15: 30 mg via INTRAVENOUS
  Filled 2014-06-15: qty 1

## 2014-06-15 MED ORDER — METOPROLOL SUCCINATE 50 MG PO TB24 *I*
50.0000 mg | ORAL_TABLET | Freq: Every day | ORAL | Status: AC
Start: 2014-06-15 — End: 2014-07-15

## 2014-06-15 MED ORDER — AMLODIPINE BESYLATE 10 MG PO TABS *I*
10.0000 mg | ORAL_TABLET | Freq: Once | ORAL | Status: AC
Start: 2014-06-15 — End: 2014-06-15
  Administered 2014-06-15: 10 mg via ORAL
  Filled 2014-06-15: qty 1

## 2014-06-15 MED ORDER — ONDANSETRON HCL 4 MG PO TABS *I*
4.0000 mg | ORAL_TABLET | Freq: Three times a day (TID) | ORAL | Status: DC | PRN
Start: 2014-06-15 — End: 2020-06-05

## 2014-06-15 MED ORDER — NICOTINE 14 MG/24HR TD PT24 *I*
1.0000 | MEDICATED_PATCH | Freq: Every day | TRANSDERMAL | Status: AC
Start: 2014-06-15 — End: 2014-07-15

## 2014-06-15 MED ORDER — PROCHLORPERAZINE MALEATE 10 MG PO TABS *I*
10.0000 mg | ORAL_TABLET | Freq: Once | ORAL | Status: AC
Start: 2014-06-15 — End: 2014-06-15
  Administered 2014-06-15: 10 mg via ORAL
  Filled 2014-06-15: qty 1

## 2014-06-15 MED ORDER — METOPROLOL TARTRATE 25 MG PO TABS *I*
50.0000 mg | ORAL_TABLET | Freq: Once | ORAL | Status: AC
Start: 2014-06-15 — End: 2014-06-15
  Administered 2014-06-15: 50 mg via ORAL
  Filled 2014-06-15: qty 2

## 2014-06-15 MED ORDER — OXYCODONE-ACETAMINOPHEN 5-325 MG PO TABS *I*
1.0000 | ORAL_TABLET | ORAL | Status: DC | PRN
Start: 2014-06-15 — End: 2020-06-05

## 2014-06-15 MED ORDER — AMLODIPINE BESYLATE 10 MG PO TABS *I*
10.0000 mg | ORAL_TABLET | Freq: Every day | ORAL | Status: AC
Start: 2014-06-15 — End: 2014-07-15

## 2014-06-15 NOTE — ED Notes (Signed)
Pt off unit to CT

## 2014-06-15 NOTE — ED Notes (Signed)
Off unit to ultrasound.

## 2014-06-15 NOTE — ED Notes (Signed)
Pt resting comfortably in bed with family at bedside. Pt c/o HA at this time. Provider aware. NAD. Pt remains on tele. Call bell within reach, side rails up, bed in low and locked position. Will continue to monitor and treat per MD orders.

## 2014-06-15 NOTE — ED Notes (Signed)
Provider made aware of BP.

## 2014-06-15 NOTE — ED Notes (Signed)
MD at bedside. 

## 2014-06-15 NOTE — ED Notes (Signed)
Pt states that he has HTN, but has not been taking his BP meds. Pt states that he has R calf pain on Wednesday, migraines for the past month, and today at 2 am shooting pain down entire right side of body, and blood in stools and urine. Pt admits to feeling lightheaded. Pt admits to having CP, pain on right side of body, and HA. Pt states that right side feels numb. Pt has full range of motion in all extremities. Pt sensation feels the same on upper and lower extremities. Pt admits to nausea and diarrhea. Pt denies vomiting. NAD. Pt placed on tele. Call bell within reach, side rails up, bed in low and locked position. Will continue to monitor and treat per MD orders.

## 2014-06-15 NOTE — ED Provider Progress Notes (Signed)
ED Follow Up Note    After receiving analgesia, antiemetics, IVF and supportive care, the patient's condition is improved.    Diagnostic studies show no DVT or intracranial abnormalities.    The patient will be discharged    Katheran Jameslivia Meier, MD, 06/15/2014, 6:31 AM

## 2014-06-15 NOTE — ED Notes (Signed)
Assumed care of pt at this time  Will monitor and treat per providers orders.

## 2014-06-15 NOTE — Discharge Instructions (Signed)
Follow Up: Please call [facility] [phone] tomorrow morning to set up an appointment to establish care for long term care. When calling, please indicate you were seen in the ED and was recommended by your doctor to be seen soon for care.  Location:  [facility]  [facility], Fifth Floor  [address], [address]  [place], [place] [id number]    Blood Pressure: Please take metoprolol 50mg  once a day and amlodipine 10mg  once a day. To aid in reducing blood pressure, diet and exercise are strongly encouraged. For your diet, we recommend increasing fiber, fruits and vegetables and decreasing the amount of fast food or restaurant foods (as these are high in salts and fat). Once your leg is healed, started a daily exercise routine is strongly encouraged.    Smoking Cessation (quiting smoking): We strongly recommend quitting smoking. To help you quit, you can use a nicotine patch, placed one time a day and replaced every 24 hours. To suppress urges, you can also use nicotine gum occasionally.     Nausea and Headache: Zofran 4mg  three times a day as needed.    Leg and back pain: You can take Percocet every 4 to 6 hours, please do not exceed 12 pills in one day.

## 2014-06-15 NOTE — ED Provider Notes (Addendum)
History     Chief Complaint   Patient presents with    Leg Pain       HPI Comments: Patient is a 38yo male presenting with right calf pain in the setting of uncontrolled HTN, obesity, depression, migraines and tobacco abuse. Patient states his right calf pain started on Wednesday, he woke up with the pain worse with walking and sensitive to touch. Leg appears to be getting very swollen and larger than his left leg. Shooting pain up and down right side started around 2pm today, starts on posterior right leg and goes up back of leg and his entire left side of back. Comes for a few seconds to minutes then goes away.  He also now has noticed bilateral hand swelling, which he first noticed this afternoon. Patient complains of having headache. States he has had migraines for 1-2 years. His headache today is mostly located bitemporal and frontal associated with blurry vision with intermittent "floaters". Patient has nausea but no vomiting.     Noticed blood on toilet paper for approximately 1 month. He has had this problem before. No changes in bowel movement, possibly mild diarrhea.    [name] endorses being on 3 different blood pressure medications and he is supposed to take pills BID, however, he has lost his primary care doctor and has not been able to get refills on his medication; therefore, he has not been taking his meds.         History provided by:  Patient      Past Medical History   Diagnosis Date    Coronary artery disease     Obesity     Psychiatric disorder     Hypertension             Past Surgical History   Procedure Laterality Date    Cholecystectomy      Wrist surgery         Family History   Problem Relation Age of Onset    Migraines Mother          Social History      reports that he quit smoking about a year ago. His smoking use included Cigarettes. He smoked 0.00 packs per day. He has never used smokeless tobacco. He reports that he does not drink alcohol, use illicit drugs, or engage  in sexual activity.    Living Situation     Questions Responses    Patient lives with Family    Comment: sister in law and niece     Homeless No    Caregiver for other family member No    External Services None    Employment Unemployed    Domestic Violence Risk No          Problem List     Patient Active Problem List   Diagnosis Code    Coronary artery disease I25.10    Obesity E66.9    Psychiatric disorder F99    Hypertension I10       Review of Systems   Review of Systems   Constitutional: Positive for diaphoresis and activity change. Negative for fever and appetite change.   HENT: Negative for congestion, facial swelling, rhinorrhea and sore throat.    Eyes: Negative for photophobia and visual disturbance.   Respiratory: Negative for cough, shortness of breath and wheezing.    Cardiovascular: Positive for leg swelling. Negative for chest pain and palpitations.   Gastrointestinal: Positive for nausea, diarrhea and blood in stool. Negative for  vomiting, abdominal pain, constipation and abdominal distention.   Endocrine: Negative for polyuria.   Genitourinary: Negative for dysuria, hematuria and flank pain.   Musculoskeletal: Positive for back pain.   Skin: Positive for wound.   Allergic/Immunologic: Negative for immunocompromised state.   Neurological: Positive for light-headedness and headaches. Negative for dizziness.   Hematological: Does not bruise/bleed easily.   Psychiatric/Behavioral: Negative for behavioral problems and agitation.       Physical Exam     ED Triage Vitals   BP Heart Rate Heart Rate (via Pulse Ox) Resp Temp Temp src SpO2 O2 Device O2 Flow Rate   06/14/14 2322 06/14/14 2322 -- 06/14/14 2322 06/14/14 2322 06/14/14 2322 06/14/14 2322 06/14/14 2322 --   232/124 mmHg 106  18 36.4 C (97.5 F) TEMPORAL 99 % None (Room air)       Weight           06/14/14 2322           124.739 kg (275 lb)               Physical Exam   Constitutional: He is oriented to person, place, and time. He appears  well-developed and well-nourished. No distress.   HENT:   Head: Normocephalic and atraumatic.   Mouth/Throat: No oropharyngeal exudate.   Eyes: Conjunctivae and EOM are normal. Pupils are equal, round, and reactive to light. No scleral icterus.   Neck: Normal range of motion.   Cardiovascular: Normal rate, regular rhythm and normal heart sounds.  Exam reveals no gallop and no friction rub.    No murmur heard.  Pulmonary/Chest: Effort normal and breath sounds normal. No respiratory distress. He has no decreased breath sounds.   Abdominal: Soft. Bowel sounds are normal. He exhibits no distension. There is no tenderness.   Musculoskeletal: Normal range of motion. He exhibits tenderness.        Right lower leg: He exhibits tenderness and swelling.   Healing rash on right shin, non-tender but crusting over small papules. Right popliteal fossa is particularly tender, no palpable cord.    Neurological: He is alert and oriented to person, place, and time. He displays normal reflexes. No cranial nerve deficit. He exhibits normal muscle tone. Coordination normal.   Skin: Skin is warm and dry. He is not diaphoretic.   Psychiatric: He has a normal mood and affect. His behavior is normal.   Nursing note and vitals reviewed.      Medical Decision Making      Amount and/or Complexity of Data Reviewed  Clinical lab tests: ordered  Tests in the radiology section of CPT: ordered  Review and summarize past medical records: yes  Independent visualization of images, tracings, or specimens: yes        Initial Evaluation:  ED First Provider Contact     Date/Time Event User Comments    06/14/14 2328 ED Provider First Contact VENTELLO, CHRISTINE K Initial Face to Face Provider Contact          Patient seen by me as above    Assessment:  38 y.o., male comes to the ED with right leg pain and swelling with the above history.    Differential Diagnosis includes DVT vs cellulitis of right lower extremity; headache 2/2 HTN vs intracranial  pressure    Plan:   - Lower extremity doppler US for possibly DVT  - Head CT due to migraines in the setting of HTN and blurry vision  - CBC, BMP, RUQ panel  -  Pain control with Morphine as needed. Will control headache with compazine and Toradol  - Severely elevated BP -> will treat with home metoprolol and amlodipine  - Will re-evaluate once laboratory and imaging results return and will continue to change based of clinical scenario.    Katheran Jameslivia Meier, MD              Katheran JamesMeier, Olivia, MD  Resident  06/15/14 [phone]    Pt comes in with right leg pain and swelling.  History of HTN, has not been taking home medications for prolonged period of time because he ran out and had no further refills and his PCP changed practices.  Also long history of smoking.  Well appearing, hypertensive but asymptomatic with no chest pain, no SOB, no focal neuro symptoms.  Has a headache which is typical for his prior migraines.  Exam most concerning for DVT.  Will check basic labs, given analgesia and given home medications for HTN.  However at this point if negative work up, pt mainly needs regular follow up care with a PCP for his multiple medical issues.  Also had long discussion about how he needed to quit smoking and the degree of increased cardiac risk this adds.  Pt would like a prescription for nicotine patches and lozenges as well.  If negative work up will discharge with prescriptions for metoprolol and amlodipine - pt is unsure of what his 3rd antihypertensive was.  Will refer pt to urgent care clinic with medicine service to establish rapid care and discharge with clear return precautions if negative [place] and work up.    Resident Attestation:     Patient seen by me on arrival date of 06/14/2014 at 0315    History:   I reviewed this patient, reviewed the resident's note and agree.  Exam:   I examined this patient, reviewed the resident's note and agree, with edits as above.    Decision Making:   I discussed with the resident his/her  documented decision making  and agree, with edits as above.      Author Delton CoombesAEKTA Shamecka Hocutt, MD      Delton CoombesMiglani, Kindsey Eblin, MD  06/15/14 [phone]

## 2014-06-15 NOTE — ED Notes (Signed)
ED RN INTERN ATTESTATION       I Nanda QuintonBrian L Rexford Prevo, RN (RN) reviewed the following charting information by the RN intern:  vg  Nursing Assessments  Medications  Plan of Care  Teaching   Notes    In the chart of Robert Valdez (38 y.o. male) and attest to the charting being accurate.

## 2014-07-10 LAB — EKG 12-LEAD
P: 55 degrees
QRS: 106 degrees
Rate: 98 {beats}/min
Severity: BORDERLINE
Severity: BORDERLINE
Statement: ABNORMAL
Statement: BORDERLINE
T: 9 degrees

## 2016-05-30 ENCOUNTER — Observation Stay (HOSPITAL_COMMUNITY)
Admission: EM | Admit: 2016-05-30 | Discharge: 2016-05-31 | Disposition: A | Discharge status: Home / Self Care | Payer: Self-pay | Attending: Family Medicine | Admitting: Family Medicine

## 2016-05-30 DIAGNOSIS — I160 Hypertensive urgency: Principal | ICD-10-CM | POA: Diagnosis present

## 2016-05-30 DIAGNOSIS — E669 Obesity, unspecified: Secondary | ICD-10-CM | POA: Insufficient documentation

## 2016-05-30 DIAGNOSIS — F17210 Nicotine dependence, cigarettes, uncomplicated: Secondary | ICD-10-CM | POA: Insufficient documentation

## 2016-05-30 DIAGNOSIS — R9431 Abnormal electrocardiogram [ECG] [EKG]: Secondary | ICD-10-CM

## 2016-05-30 DIAGNOSIS — Z9889 Other specified postprocedural states: Secondary | ICD-10-CM | POA: Insufficient documentation

## 2016-05-30 DIAGNOSIS — I4581 Long QT syndrome: Secondary | ICD-10-CM | POA: Insufficient documentation

## 2016-05-30 DIAGNOSIS — Z6834 Body mass index (BMI) 34.0-34.9, adult: Secondary | ICD-10-CM | POA: Insufficient documentation

## 2016-05-30 DIAGNOSIS — R42 Dizziness and giddiness: Secondary | ICD-10-CM

## 2016-05-30 DIAGNOSIS — Z9049 Acquired absence of other specified parts of digestive tract: Secondary | ICD-10-CM | POA: Insufficient documentation

## 2016-05-30 DIAGNOSIS — I10 Essential (primary) hypertension: Secondary | ICD-10-CM | POA: Diagnosis present

## 2016-05-30 DIAGNOSIS — Z9119 Patient's noncompliance with other medical treatment and regimen: Secondary | ICD-10-CM | POA: Insufficient documentation

## 2016-05-30 DIAGNOSIS — R079 Chest pain, unspecified: Secondary | ICD-10-CM | POA: Diagnosis present

## 2016-05-30 HISTORY — DX: Essential (primary) hypertension: I10

## 2016-05-30 NOTE — ED Provider Notes (Signed)
TIME SEEN:  By signing my name below, I, Arianna Nassar, attest that this documentation has been prepared under the direction and in the presence of [employer], DO.  Electronically Signed: Julien Nordmann, ED Scribe. 05/31/16. 12:17 AM.  CHIEF COMPLAINT:  Chief Complaint  Patient presents with  . Dizziness     HPI:  HPI Comments: Richard Patterson is a 40 y.o. male who has PMhx of HTN presents to the Emergency Department complaining of acute onset, intermittent dizziness that started about two days ago. Pt say that last night at work around [place], he began to have acute onset dizziness and blurry vision that lasted ~ 2 minutes. He states he was asymptomatic when he got home from work this morning and laid down feeling fine. Pt reports waking up feeling dizzy which he describes as light headed and room spinning, with minor sudden onset chest pain. He states that he began sweating, which he describes as "it felt like bucket of water drenched the side of my face". He describes his chest pain as sharp and stabbing, to the left side of his chest with shortness of breath. This lasted for 5 minutes without rest and then completely resolved. Pt says he felt nauseous and notes having dry mouth in last 30 minutes, generalized mild to moderate throbbing gradual onset headache that started ~ 1 hour ago. Pt has taken two Aleve to relieve his headache with temporary relief. Pt has a PMhx of HTN and has been non-compliant with his HTN medication which he notes may be close to one year. Was previously on 3 medications but cannot room or the name of these medications. Does not have a PCP in this area. Denies vomiting, numbness, tingling or focal weakness. Denies a hx of DM, hx of high cholesterol.  No history of PE, DVT, exogenous estrogen use, fracture, surgery, trauma, hospitalization, prolonged travel. No lower extremity swelling or pain. No calf tenderness. Pt is a smoker.    ROS: See HPI Constitutional: no fever   Eyes: no drainage  ENT: no runny nose   Cardiovascular:  (+) chest pain  Resp: no SOB  GI: no vomiting GU: no dysuria Integumentary: no rash  Allergy: no hives  Musculoskeletal: no leg swelling  Neurological: no slurred speech ROS otherwise negative  PAST MEDICAL HISTORY/PAST SURGICAL HISTORY:  No past medical history on file.  MEDICATIONS:  Prior to Admission medications   Not on File    ALLERGIES:  Allergies not on file  SOCIAL HISTORY:  Social History  Substance Use Topics  . Smoking status: Not on file  . Smokeless tobacco: Not on file  . Alcohol use Not on file    FAMILY HISTORY: No family history on file.  EXAM: BP (!) 227/128 (BP Location: Left Arm)   Pulse 68   Temp 97.9 F (36.6 C) (Oral)   Resp 20   Ht 6\' 1"  (1.854 m)   Wt 265 lb (120.2 kg)   SpO2 99%   BMI 34.96 kg/m  CONSTITUTIONAL: Alert and oriented and responds appropriately to questions. Appears uncomfortable but is afebrile and nontoxic, well-hydrated HEAD: Normocephalic EYES: Conjunctivae clear, PERRL ENT: normal nose; no rhinorrhea; moist mucous membranes NECK: Supple, no meningismus, no LAD  CARD: RRR; S1 and S2 appreciated; no murmurs, no clicks, no rubs, no gallops RESP: Normal chest excursion without splinting or tachypnea; breath sounds clear and equal bilaterally; no wheezes, no rhonchi, no rales, no hypoxia or respiratory distress, speaking full sentences ABD/GI: Normal bowel sounds;  non-distended; soft, non-tender, no rebound, no guarding, no peritoneal signs BACK:  The back appears normal and is non-tender to palpation, there is no CVA tenderness EXT: Normal ROM in all joints; non-tender to palpation; no edema; normal capillary refill; no cyanosis, no calf tenderness or swelling    SKIN: Normal color for age and race; warm; no rash NEURO: Moves all extremities equally, sensation to light touch intact diffusely, cranial nerves II through XII intact; no dysmetria to finger to nose  testing bilaterally, normal heel to shin testing bilaterally; no aphasia or dysarthria; no pronator drift PSYCH: The patient's mood and manner are appropriate. Grooming and personal hygiene are appropriate.  MEDICAL DECISION MAKING: Patient here with concerns for hypertensive urgency versus emergency. He has an abnormal EKG with no old for comparison. He is not currently having any chest pain or shortness of breath. Complains of mild diffuse throbbing headache, intermittent episodes of vertigo and lightheadedness. Neurologically intact currently. We'll give IV hydralazine and start a nitroglycerin drip. Will obtain labs, urine, head CT, chest x-ray to evaluate for signs of end organ damage. Doubt dissection at this time given he has no chest pain, abdominal pain, back pain or shortness of breath. Doubt PE. He does have risk factors for ACS. Patient will need admission. He does not currently have a PCP.  ED PROGRESS: Patient is still not having any chest pain. Labs have been unremarkable. Urine shows no proteinuria, hematuria. Negative troponin. Normal creatinine. He is still significant hypertensive but improving with nitroglycerin drip.  Complaining of increasing headache however. Will give dose of IV morphine. Head CT unremarkable. Chest x-ray shows no widened mediastinum, infiltrate or edema. We'll discuss with medicine for admission. He does not have a PCP. At this time I feel he can be admitted to step down. He is still neurologically intact.  2:15 AM Discussed patient's case with hospitalist, Dr. Loleta Books.  Recommend admission to observation, stepdown bed.  I will place holding orders per their request. Patient and family (if present) updated with plan. Care transferred to hospitalist service.  I reviewed all nursing notes, vitals, pertinent old records, EKGs, labs, imaging (as available).     EKG Interpretation  Date/Time:  Monday May 30 2016 23:34:18 EST Ventricular Rate:  65 PR  Interval:  156 QRS Duration: 98 QT Interval:  446 QTC Calculation: 463 R Axis:   101 Text Interpretation:  Normal sinus rhythm Rightward axis ST & T wave abnormality, consider inferolateral ischemia Prolonged QT Abnormal ECG No old tracing to compare Confirmed by Efstathios Sawin,  DO, Donelda Mailhot ([id number]) on 05/30/2016 11:38:26 PM        CRITICAL CARE Performed by: Nyra Jabs   Total critical care time: 45 minutes  Critical care time was exclusive of separately billable procedures and treating other patients.  Critical care was necessary to treat or prevent imminent or life-threatening deterioration.  Critical care was time spent personally by me on the following activities: development of treatment plan with patient and/or surrogate as well as nursing, discussions with consultants, evaluation of patient's response to treatment, examination of patient, obtaining history from patient or surrogate, ordering and performing treatments and interventions, ordering and review of laboratory studies, ordering and review of radiographic studies, pulse oximetry and re-evaluation of patient's condition.    I personally performed the services described in this documentation, which was scribed in my presence. The recorded information has been reviewed and is accurate.     [place], DO 05/31/16 0236

## 2016-05-30 NOTE — ED Triage Notes (Signed)
Pt st's he had dizziness yesterday. Then woke up this pm with dizziness and blurred vision.  Chest pain comes and goes.

## 2016-05-30 NOTE — ED Notes (Signed)
Patient transported to X-ray 

## 2016-05-31 ENCOUNTER — Emergency Department (HOSPITAL_COMMUNITY): Payer: Self-pay

## 2016-05-31 ENCOUNTER — Observation Stay (HOSPITAL_COMMUNITY): Payer: Self-pay

## 2016-05-31 ENCOUNTER — Encounter (HOSPITAL_COMMUNITY): Payer: Self-pay | Admitting: Radiology

## 2016-05-31 DIAGNOSIS — I10 Essential (primary) hypertension: Secondary | ICD-10-CM | POA: Diagnosis present

## 2016-05-31 DIAGNOSIS — I160 Hypertensive urgency: Secondary | ICD-10-CM

## 2016-05-31 DIAGNOSIS — H5500 Unspecified nystagmus: Secondary | ICD-10-CM

## 2016-05-31 DIAGNOSIS — R079 Chest pain, unspecified: Secondary | ICD-10-CM | POA: Diagnosis present

## 2016-05-31 LAB — CBC WITH DIFFERENTIAL/PLATELET
Basophils Absolute: 0 10*3/uL (ref 0.0–0.1)
Basophils Relative: 0 %
Eosinophils Absolute: 0.2 10*3/uL (ref 0.0–0.7)
Eosinophils Relative: 2 %
HCT: 50.2 % (ref 39.0–52.0)
Hemoglobin: 17.7 g/dL — ABNORMAL HIGH (ref 13.0–17.0)
Lymphocytes Relative: 23 %
Lymphs Abs: 2.2 10*3/uL (ref 0.7–4.0)
MCH: 31.5 pg (ref 26.0–34.0)
MCHC: 35.3 g/dL (ref 30.0–36.0)
MCV: 89.3 fL (ref 78.0–100.0)
Monocytes Absolute: 0.5 10*3/uL (ref 0.1–1.0)
Monocytes Relative: 5 %
Neutro Abs: 6.8 10*3/uL (ref 1.7–7.7)
Neutrophils Relative %: 70 %
Platelets: 181 10*3/uL (ref 150–400)
RBC: 5.62 MIL/uL (ref 4.22–5.81)
RDW: 13.1 % (ref 11.5–15.5)
WBC: 9.7 10*3/uL (ref 4.0–10.5)

## 2016-05-31 LAB — URINALYSIS, ROUTINE W REFLEX MICROSCOPIC
Bilirubin Urine: NEGATIVE
Glucose, UA: NEGATIVE mg/dL
Hgb urine dipstick: NEGATIVE
Ketones, ur: NEGATIVE mg/dL
Leukocytes, UA: NEGATIVE
Nitrite: NEGATIVE
Protein, ur: NEGATIVE mg/dL
Specific Gravity, Urine: 1.02 (ref 1.005–1.030)
pH: 8 (ref 5.0–8.0)

## 2016-05-31 LAB — BASIC METABOLIC PANEL
Anion gap: 7 (ref 5–15)
BUN: 12 mg/dL (ref 6–20)
CO2: 23 mmol/L (ref 22–32)
Calcium: 9 mg/dL (ref 8.9–10.3)
Chloride: 109 mmol/L (ref 101–111)
Creatinine, Ser: 0.8 mg/dL (ref 0.61–1.24)
GFR calc Af Amer: >60 mL/min (ref >60)
GFR calc non Af Amer: >60 mL/min (ref >60)
Glucose, Bld: 95 mg/dL (ref 65–99)
Potassium: 4 mmol/L (ref 3.5–5.1)
Sodium: 139 mmol/L (ref 135–145)

## 2016-05-31 LAB — BRAIN NATRIURETIC PEPTIDE: B Natriuretic Peptide: 106 pg/mL — ABNORMAL HIGH (ref 0.0–100.0)

## 2016-05-31 LAB — TROPONIN I
Troponin I: <0.03 ng/mL (ref <0.03)
Troponin I: <0.03 ng/mL (ref <0.03)

## 2016-05-31 LAB — I-STAT TROPONIN, ED: Troponin i, poc: 0.01 ng/mL (ref 0.00–0.08)

## 2016-05-31 LAB — RAPID URINE DRUG SCREEN, HOSP PERFORMED
Amphetamines: NOT DETECTED
Barbiturates: NOT DETECTED
Benzodiazepines: NOT DETECTED
Cocaine: NOT DETECTED
Opiates: NOT DETECTED
Tetrahydrocannabinol: NOT DETECTED

## 2016-05-31 MED ORDER — ONDANSETRON HCL 4 MG/2ML IJ SOLN
4.0000 mg | Freq: Once | INTRAMUSCULAR | Status: AC
  Administered 2016-05-31: 4 mg via INTRAVENOUS
  Filled 2016-05-31: qty 2

## 2016-05-31 MED ORDER — PROMETHAZINE HCL 25 MG/ML IJ SOLN
12.5000 mg | Freq: Four times a day (QID) | INTRAMUSCULAR | Status: DC | PRN
  Administered 2016-05-31: 12.5 mg via INTRAVENOUS
  Filled 2016-05-31: qty 1

## 2016-05-31 MED ORDER — PROMETHAZINE HCL 25 MG PO TABS: 25.0000 mg | ORAL_TABLET | Freq: Four times a day (QID) | ORAL | Status: DC | PRN

## 2016-05-31 MED ORDER — AMLODIPINE BESYLATE 10 MG PO TABS: 10.0000 mg | ORAL_TABLET | Freq: Every day | ORAL | 1 refills | Status: DC

## 2016-05-31 MED ORDER — NITROGLYCERIN IN D5W 200-5 MCG/ML-% IV SOLN
5.0000 ug/min | INTRAVENOUS | Status: DC
  Administered 2016-05-31: 5 ug/min via INTRAVENOUS
  Filled 2016-05-31: qty 250

## 2016-05-31 MED ORDER — HYDRALAZINE HCL 20 MG/ML IJ SOLN
10.0000 mg | Freq: Once | INTRAMUSCULAR | Status: AC
  Administered 2016-05-31: 10 mg via INTRAVENOUS
  Filled 2016-05-31: qty 1

## 2016-05-31 MED ORDER — HYDROCHLOROTHIAZIDE 25 MG PO TABS: 25.0000 mg | ORAL_TABLET | Freq: Every day | ORAL | 1 refills | Status: DC

## 2016-05-31 MED ORDER — HYDRALAZINE HCL 20 MG/ML IJ SOLN
10.0000 mg | Freq: Once | INTRAMUSCULAR | Status: AC
Start: 2016-05-31 — End: 2016-05-31
  Administered 2016-05-31: 10 mg via INTRAVENOUS
  Filled 2016-05-31: qty 1

## 2016-05-31 MED ORDER — AMLODIPINE BESYLATE 5 MG PO TABS
10.0000 mg | ORAL_TABLET | Freq: Every day | ORAL | Status: DC
  Administered 2016-05-31: 10 mg via ORAL
  Filled 2016-05-31: qty 2

## 2016-05-31 MED ORDER — HYDROCHLOROTHIAZIDE 25 MG PO TABS
25.0000 mg | ORAL_TABLET | Freq: Every day | ORAL | Status: DC
  Administered 2016-05-31: 25 mg via ORAL
  Filled 2016-05-31: qty 1

## 2016-05-31 MED ORDER — LORAZEPAM 2 MG/ML IJ SOLN
1.0000 mg | Freq: Once | INTRAMUSCULAR | Status: AC
  Administered 2016-05-31: 1 mg via INTRAVENOUS
  Filled 2016-05-31: qty 1

## 2016-05-31 MED ORDER — ONDANSETRON HCL 4 MG/2ML IJ SOLN: 4.0000 mg | Freq: Four times a day (QID) | INTRAMUSCULAR | Status: DC | PRN

## 2016-05-31 MED ORDER — HYDROMORPHONE HCL 2 MG/ML IJ SOLN
1.0000 mg | Freq: Once | INTRAMUSCULAR | Status: AC
  Administered 2016-05-31: 1 mg via INTRAVENOUS
  Filled 2016-05-31: qty 1

## 2016-05-31 MED ORDER — ENOXAPARIN SODIUM 40 MG/0.4ML ~~LOC~~ SOLN: 40.0000 mg | SUBCUTANEOUS | Status: DC

## 2016-05-31 MED ORDER — MORPHINE SULFATE (PF) 4 MG/ML IV SOLN
4.0000 mg | Freq: Once | INTRAVENOUS | Status: AC
  Administered 2016-05-31: 4 mg via INTRAVENOUS
  Filled 2016-05-31: qty 1

## 2016-05-31 MED ORDER — LOSARTAN POTASSIUM 50 MG PO TABS
100.0000 mg | ORAL_TABLET | Freq: Once | ORAL | Status: AC
  Administered 2016-05-31: 100 mg via ORAL
  Filled 2016-05-31: qty 2

## 2016-05-31 MED ORDER — GI COCKTAIL ~~LOC~~: 30.0000 mL | Freq: Four times a day (QID) | ORAL | Status: DC | PRN

## 2016-05-31 MED ORDER — ACETAMINOPHEN 325 MG PO TABS
650.0000 mg | ORAL_TABLET | ORAL | Status: DC | PRN
  Administered 2016-05-31: 650 mg via ORAL
  Filled 2016-05-31: qty 2

## 2016-05-31 MED ORDER — PROMETHAZINE HCL 25 MG RE SUPP
25.0000 mg | Freq: Four times a day (QID) | RECTAL | Status: DC | PRN
  Filled 2016-05-31: qty 1

## 2016-05-31 NOTE — ED Notes (Signed)
Cardiology at bedside.

## 2016-05-31 NOTE — Consult Note (Signed)
Cardiology Consult    Patient ID: Richard Patterson MRN: [id number], DOB/AGE: [birth year]   Admit date: 05/30/2016 Date of Consult: 05/31/2016  Primary Physician: No primary care provider on file. Primary Cardiologist: New Requesting Provider: Dr. Loleta Books Reason for Consultation: chest pain/abnormal EKG  Patient Profile    40 yo male with PMH of HTN and tobacco use who presented with dizziness and generalized weakness.   Past Medical History   Past Medical History:  Diagnosis Date  . Essential hypertension     Past Surgical History:  Procedure Laterality Date  . CHOLECYSTECTOMY    . HAND TENDON SURGERY Right      Allergies  Allergies  Allergen Reactions  . Shellfish Allergy Hives and Rash    History of Present Illness    [name] is a 40 yo male with PMH of HTN and tobacco use who recently moved here from [facility] about a year ago. Reports he was diagnosed with hypertension about 10 years ago, and was on 3 different medications prior to moving to this area. States he's had trouble getting established with a PCP, and has been out of his medications for at least a year. States he works a very physical job, and does not normally have any anginal symptoms or dyspnea. Denies ever having had a cardiac workup in the past, and reports only family history of hypertension.  Reports intermittent headaches over the past couple of months. States on Sunday he had an episode of dizziness, which eventually resolved on its own. Then again last night while he was at work around 8 PM reports he stood up, felt weak and lightheaded and then developed a headache. States the symptoms resolved on her own after a few minutes. Did have a brief episode of fleeting left-sided chest pressure that only lasted 2-3 minutes. Denies any nausea, vomiting, palpitations, or lower extremity edema.   In the ED his initial blood pressure was 227/128, labs showed stable electrolytes, creatinine of 0.8, BNP of 106, troponin  negative 2, and chest x-ray was negative. EKG showed sinus rhythm with deep inferior lateral T-wave inversions and right axis deviation with prolonged QT, with no EKGs to compare.   Inpatient Medications    . amLODipine  10 mg Oral Daily  . enoxaparin (LOVENOX) injection  40 mg Subcutaneous Q24H    Family History    Family History  Problem Relation Age of Onset  . Hypertension Mother   . Hypertension Father   . Cancer Maternal Grandmother   . Cancer Maternal Grandfather     Social History    Social History   Social History  . Marital status: Single    Spouse name: N/A  . Number of children: N/A  . Years of education: N/A   Occupational History  . Not on file.   Social History Main Topics  . Smoking status: Current Every Day Smoker  . Smokeless tobacco: Never Used  . Alcohol use No  . Drug use: Unknown  . Sexual activity: Not on file   Other Topics Concern  . Not on file   Social History Narrative  . No narrative on file     Review of Systems    General:  No chills, fever, night sweats or weight changes.  Cardiovascular:  See HPI Dermatological: No rash, lesions/masses Respiratory: No cough, dyspnea Urologic: No hematuria, dysuria Abdominal:   No nausea, vomiting, diarrhea, bright red blood per rectum, melena, or hematemesis Neurologic:  See HPI All other systems reviewed and  are otherwise negative except as noted above.  Physical Exam    Blood pressure 162/97, pulse 80, temperature 97.9 F (36.6 C), temperature source Oral, resp. rate 20, height 6\' 1"  (1.854 m), weight 265 lb (120.2 kg), SpO2 96 %.  General: Pleasant obese male, NAD Psych: Normal affect. Neuro: Alert and oriented X 3. Moves all extremities spontaneously. HEENT: Normal  Neck: Supple without bruits or JVD. Lungs:  Resp regular and unlabored, Slight expiratory wheezing. Heart: RRR no s3, s4, or murmurs. Abdomen: Soft, non-tender, non-distended, BS + x 4.  Extremities: No clubbing,  cyanosis or edema. DP/PT/Radials 2+ and equal bilaterally.  Labs    Troponin [facility] Test)  Recent Labs  05/31/16 0023  TROPIPOC 0.01   No results for input(s): CKTOTAL, CKMB, TROPONINI in the last 72 hours. Lab Results  Component Value Date   WBC 9.7 05/30/2016   HGB 17.7 (H) 05/30/2016   HCT 50.2 05/30/2016   MCV 89.3 05/30/2016   PLT 181 05/30/2016    Recent Labs Lab 05/30/16 2350  NA 139  K 4.0  CL 109  CO2 23  BUN 12  CREATININE 0.80  CALCIUM 9.0  GLUCOSE 95   No results found for: CHOL, HDL, LDLCALC, TRIG No results found for: [facility]   Radiology Studies    Dg Chest 2 View  Result Date: 05/31/2016 CLINICAL DATA:  High blood pressure and nausea tonight. Chest pain, shortness of breath, hypertension. EXAM: CHEST  2 VIEW COMPARISON:  None. FINDINGS: The heart size and mediastinal contours are within normal limits. Both lungs are clear. The visualized skeletal structures are unremarkable. IMPRESSION: No active cardiopulmonary disease. Electronically Signed   By: Lucienne Capers M.D.   On: 05/31/2016 01:30   Ct Head Wo Contrast  Result Date: 05/31/2016 CLINICAL DATA:  Headache and hypertension. EXAM: CT HEAD WITHOUT CONTRAST TECHNIQUE: Contiguous axial images were obtained from the base of the skull through the vertex without intravenous contrast. COMPARISON:  None. FINDINGS: Brain: No mass lesion, intraparenchymal hemorrhage or extra-axial collection. No evidence of acute cortical infarct. Brain parenchyma and CSF-containing spaces are normal for age. Vascular: No hyperdense vessel or unexpected calcification. Skull: Normal visualized skull base, calvarium and extracranial soft tissues. Sinuses/Orbits: No sinus fluid levels or advanced mucosal thickening. No mastoid effusion. Normal orbits. IMPRESSION: Normal head CT. Electronically Signed   By: Ulyses Jarred M.D.   On: 05/31/2016 01:53   Mr Brain Wo Contrast  Result Date: 05/31/2016 CLINICAL DATA:  Vertigo  EXAM: MRI HEAD WITHOUT CONTRAST TECHNIQUE: Multiplanar, multiecho pulse sequences of the brain and surrounding structures were obtained without intravenous contrast. COMPARISON:  CT head 05/31/2016 FINDINGS: Image quality degraded by motion which became progressively worse during the study. Brain: Ventricle size normal. Cerebral volume normal. Pituitary normal in size. Negative for acute infarct. Solitary hyperintensity in the left lateral putamen likely an area of chronic infarction. Cerebral white matter normal. Brainstem and cerebellum normal. Negative for hemorrhage or fluid collection. Negative for mass or edema. No shift of the midline structures. Vascular: Normal flow voids Skull and upper cervical spine: Negative Sinuses/Orbits: Negative Other: None IMPRESSION: No acute intracranial abnormality. Solitary small hyperintensity left putamen most consistent with chronic ischemia. Electronically Signed   By: Franchot Gallo M.D.   On: 05/31/2016 08:23    ECG & Cardiac Imaging    EKG: sinus rhythm with deep inferior lateral T-wave inversions and right axis deviation, prolonged QT   Echo: none  Assessment & Plan    40  yo male with PMH of HTN and tobacco use who presented with dizziness and generalized weakness.   1. Hypertensive Urgency: Reports he has been out of his blood pressure medications for at least a year. Was on 3 different medications when he was living in [place]. Has had intermittent headaches over the past couple of months. Blood pressure was 220/120 on admission, but has improved. EKG appears most consistent with LVH. He denies any further chest pain since presenting to the ED. Trop neg x2.  -- Will likely need 3 drug combination again for blood pressure control. Currently on amlodipine, consider adding ACEi and thiazide diuretic.  -- Will arrange for outpatient visit with possible stress test, but needs to get established with a PCP in the area for further blood pressure management.   2.  Atypical Chest pain: Likely related to his uncontrolled hypertension. Trop neg x2. EKG consistent with LVH.   3. Tobacco Use: Reports a he has reduced from a pack/day to pack/week.   Barnet Pall, NP-C Pager [phone] 05/31/2016, 12:49 PM  Patient seen, examined. Available data reviewed. Agree with findings, assessment, and plan as outlined by Reino Bellis, PA-C. The patient is independently interviewed and examined. He is alert, oriented, obese male in no distress. JVP is normal, lung fields are clear, heart is regular rate and rhythm without murmur or gallop, abdomen soft and nontender, extremity is without edema, there are no focal neurological deficits.  The patient presents with hypertensive urgency. He has long-standing hypertension and has been out of medication for over a year since he has moved here from [place]. He presents with headache. He describes 1-2 minutes of chest discomfort yesterday and states that it was mild in intensity. He's had no symptoms associated with physical exertion. He denies shortness of breath. He has no chest pain at present. His EKG shows sinus rhythm with LVH and ST/T-wave changes likely representative of strain/repolarization abnormality. Serial troponin values and chest x-ray are within normal limits.  The patient's symptoms are likely related to untreated hypertension. Blood pressure remains elevated but is improved. It would seem reasonable to start him on an affordable oral antihypertensive drug regimen such as a combination of amlodipine 10 mg, losartan 100 mg, and hydrochlorothiazide 25 mg daily. He will need primary care follow-up. Will arrange an outpatient pharmacologic stress test to evaluate his abnormal EKG findings, but I suspect this is most likely related to LVH with repolarization changes.  Sherren Mocha, M.D. 05/31/2016 1:33 PM

## 2016-05-31 NOTE — ED Notes (Addendum)
Admitting MD paged and discussed pt frustration on delays and plan of care.  Admitting reports that pt is to be evaluated by cardiology and troponins to be collected.  Pt made aware and agrees with plan of care at this time.

## 2016-05-31 NOTE — ED Notes (Signed)
Admitting MD at bedside.

## 2016-05-31 NOTE — Discharge Summary (Addendum)
Physician Discharge Summary  [place] [id number] DOB: 02-22-1976 DOA: 05/30/2016  PCP: No primary care provider on file.  Admit date: 05/30/2016 Discharge date: 05/31/2016  Admitted From: Home Disposition:  Home  Recommendations for Outpatient Follow-up:  1. Follow up with PCP in 1 weeks 2. Follow-up blood pressure   Discharge Condition: Guarded CODE STATUS: Full code Diet recommendation: Heart healthy   Brief/Interim Summary:  HPI written by Myrene Buddy on 05/31/2016  HPI: Richard Patterson is a 39 y.o. male with a past medical history significant for HTN who presents with chest pain and malaise tonight.  The patient was in his usual state of health until last night at work (he works 3rd shift, [employer] to [place]) when around [place] he got up from his station and felt weak, blurry vision, "swimmy headed" and then had a headache.  This resolved on its own after a few minutes.  Today, he got home from work, and slept on and off all day.  Around 10PM he got up to go to the bathroom and felt dizzy and blurry vision again, then had sharp left sided chest pain, moderate in intensity, not radiating, with no associated symptoms.  He checked his BP with his aunt's machine, but this only registered "39/30" which he knew was wrong, so when his dizziness and malaise and blurry vision and headache returned, he went to the ER.  ED course: -Afebrile, heart rate 60s, respirations and pulse ox normal, BP 227/128 mmHg initially -Initial ECG showed inferolateral TWI and right axis/LPFB without previous for comparison and troponin was negative. -Na 139, K 4.0, Cr 0.8 (baseline unknown), WBC 9.7, Hgb 17.7 -BNP 106 -CXR without edema and CT head normal -TRH was asked to evaluate for hypertensive urgency and chest pain   Hospital course:  Hypertensive urgency Patient's initial blood pressure improved slightly with nitro drip and IV hydralazine. He was started on amlodipine with minimal improvement.  MRI head was negative for acute stroke. Hydrochlorothiazide started. Patient requesting discharge prior to optimal blood pressure management. Discussed risks, including stroke and heart attack. Patient to follow-up with outpatient PCP (case manager gave patient information).  Chest pain Symptoms atypical. Initial troponin and repeat troponin negative. Smoking cessation discussed. Cardiology consulted and cleared for discharge home from their perspective with regard to chest pain.  Discharge Diagnoses:  Principal Problem:   Hypertensive urgency Active Problems:   Chest pain   Essential hypertension    Discharge Instructions     Medication List    TAKE these medications   amLODipine 10 MG tablet Commonly known as:  NORVASC Take 1 tablet (10 mg total) by mouth daily. Start taking on:  06/01/2016   hydrochlorothiazide 25 MG tablet Commonly known as:  HYDRODIURIL Take 1 tablet (25 mg total) by mouth daily.      Follow-up Information    Sharon Seller, NP. Go on 06/07/2016.   Specialty:  Family Medicine Why:  @ 2:00 Contact information: [place] [id number] [address] Follow up.   Why:  to fill prescriptions Contact information: [place] [phone]         Allergies  Allergen Reactions  . Shellfish Allergy Hives and Rash    Consultations:  Cardiology   Procedures/Studies: Dg Chest 2 View  Result Date: 05/31/2016 CLINICAL DATA:  High blood pressure and nausea tonight. Chest pain, shortness of breath, hypertension. EXAM: CHEST  2  VIEW COMPARISON:  None. FINDINGS: The heart size and mediastinal contours are within normal limits. Both lungs are clear. The visualized skeletal structures are unremarkable. IMPRESSION: No active cardiopulmonary disease. Electronically Signed   By: Lucienne Capers M.D.   On: 05/31/2016 01:30   Ct Head Wo  Contrast  Result Date: 05/31/2016 CLINICAL DATA:  Headache and hypertension. EXAM: CT HEAD WITHOUT CONTRAST TECHNIQUE: Contiguous axial images were obtained from the base of the skull through the vertex without intravenous contrast. COMPARISON:  None. FINDINGS: Brain: No mass lesion, intraparenchymal hemorrhage or extra-axial collection. No evidence of acute cortical infarct. Brain parenchyma and CSF-containing spaces are normal for age. Vascular: No hyperdense vessel or unexpected calcification. Skull: Normal visualized skull base, calvarium and extracranial soft tissues. Sinuses/Orbits: No sinus fluid levels or advanced mucosal thickening. No mastoid effusion. Normal orbits. IMPRESSION: Normal head CT. Electronically Signed   By: Ulyses Jarred M.D.   On: 05/31/2016 01:53   Mr Brain Wo Contrast  Result Date: 05/31/2016 CLINICAL DATA:  Vertigo EXAM: MRI HEAD WITHOUT CONTRAST TECHNIQUE: Multiplanar, multiecho pulse sequences of the brain and surrounding structures were obtained without intravenous contrast. COMPARISON:  CT head 05/31/2016 FINDINGS: Image quality degraded by motion which became progressively worse during the study. Brain: Ventricle size normal. Cerebral volume normal. Pituitary normal in size. Negative for acute infarct. Solitary hyperintensity in the left lateral putamen likely an area of chronic infarction. Cerebral white matter normal. Brainstem and cerebellum normal. Negative for hemorrhage or fluid collection. Negative for mass or edema. No shift of the midline structures. Vascular: Normal flow voids Skull and upper cervical spine: Negative Sinuses/Orbits: Negative Other: None IMPRESSION: No acute intracranial abnormality. Solitary small hyperintensity left putamen most consistent with chronic ischemia. Electronically Signed   By: Franchot Gallo M.D.   On: 05/31/2016 08:23      Subjective: Patient reports no headache, chest pain, dyspnea or palpitations  Discharge Exam: Vitals:    05/31/16 1345 05/31/16 1415  BP: 178/100 (!) 196/126  Pulse: (!) 58 79  Resp: 22 16  Temp:     Vitals:   05/31/16 1315 05/31/16 1330 05/31/16 1345 05/31/16 1415  BP: (!) 167/111 (!) 169/107 178/100 (!) 196/126  Pulse: 74 66 (!) 58 79  Resp: 18 18 22 16   Temp:      TempSrc:      SpO2: 97% 95% 94% 97%  Weight:      Height:        General: Pt is alert, awake, not in acute distress Cardiovascular: RRR, S1/S2 +, no rubs, no gallops Respiratory: CTA bilaterally, no wheezing, no rhonchi Abdominal: Soft, NT, ND, bowel sounds + Extremities: no edema, no cyanosis    The results of significant diagnostics from this hospitalization (including imaging, microbiology, ancillary and laboratory) are listed below for reference.     Microbiology: No results found for this or any previous visit (from the past 240 hour(s)).   Labs: BNP (last 3 results)  Recent Labs  05/30/16 2350  BNP [id number]*   Basic Metabolic Panel:  Recent Labs Lab 05/30/16 2350  NA 139  K 4.0  CL 109  CO2 23  GLUCOSE 95  BUN 12  CREATININE 0.80  CALCIUM 9.0   Liver Function Tests: No results for input(s): AST, ALT, ALKPHOS, BILITOT, PROT, ALBUMIN in the last 168 hours. No results for input(s): LIPASE, AMYLASE in the last 168 hours. No results for input(s): AMMONIA in the last 168 hours. CBC:  Recent Labs Lab 05/30/16 2350  WBC  9.7  NEUTROABS 6.8  HGB 17.7*  HCT 50.2  MCV 89.3  PLT 181   Cardiac Enzymes:  Recent Labs Lab 05/31/16 1149  TROPONINI <0.03   BNP: Invalid input(s): POCBNP CBG: No results for input(s): GLUCAP in the last 168 hours. D-Dimer No results for input(s): DDIMER in the last 72 hours. Hgb A1c No results for input(s): HGBA1C in the last 72 hours. Lipid Profile No results for input(s): CHOL, HDL, LDLCALC, TRIG, CHOLHDL, LDLDIRECT in the last 72 hours. Thyroid function studies No results for input(s): TSH, T4TOTAL, T3FREE, THYROIDAB in the last 72 hours.  Invalid  input(s): FREET3 Anemia work up No results for input(s): VITAMINB12, FOLATE, FERRITIN, TIBC, IRON, RETICCTPCT in the last 72 hours. Urinalysis    Component Value Date/Time   COLORURINE YELLOW 05/31/2016 0145   APPEARANCEUR CLEAR 05/31/2016 0145   LABSPEC 1.020 05/31/2016 0145   PHURINE 8.0 05/31/2016 0145   GLUCOSEU NEGATIVE 05/31/2016 0145   HGBUR NEGATIVE 05/31/2016 0145   BILIRUBINUR NEGATIVE 05/31/2016 0145   KETONESUR NEGATIVE 05/31/2016 0145   PROTEINUR NEGATIVE 05/31/2016 0145   NITRITE NEGATIVE 05/31/2016 0145   LEUKOCYTESUR NEGATIVE 05/31/2016 0145   Sepsis Labs Invalid input(s): PROCALCITONIN,  WBC,  LACTICIDVEN Microbiology No results found for this or any previous visit (from the past 240 hour(s)).   Time coordinating discharge: Over 30 minutes  SIGNED:   Cordelia Poche, MD [facility] 05/31/2016, 2:42 PM Pager [phone]  If 7PM-7AM, please contact night-coverage [email or web address] Password [email or web address]

## 2016-05-31 NOTE — ED Notes (Signed)
Attempted report 

## 2016-05-31 NOTE — Discharge Instructions (Addendum)
Richard Patterson, you were watched in the hospital because of your high blood pressure. Unfortunately, we were not able to control it well enough before you wanted to be discharged. I discussed the risk of leaving with such high blood pressures, including stroke and heart attack. I called the case manager to help you with medications and/or a primary care physician in this area. Please return immediately if you have chest pain, shortness of breath, fast heart beat or profuse sweating.

## 2016-05-31 NOTE — ED Notes (Signed)
Pt returned from MRI °

## 2016-05-31 NOTE — ED Notes (Signed)
MRI ready for Richard Patterson- Richard Patterson does not feel he can complete MRI. Will notify Dr. Loleta Books.

## 2016-05-31 NOTE — ED Notes (Signed)
Pt transported to MRI 

## 2016-05-31 NOTE — ED Notes (Signed)
Cardiology reports that pt is clear to be d/c from their standpoint and will talk about d/c with hospitalist due to pt wishing to go home.  3W and flow manager called to be made aware of delay of transport.  Spoke with Judson Roch RN on 3W.

## 2016-05-31 NOTE — ED Notes (Signed)
Pt unable to complete MRI d/t "headphones being too tight". Pt back in room now stating head is hurting again and 1 episode of vomiting

## 2016-05-31 NOTE — ED Notes (Signed)
Spoke with pt about MRI- He states he does not feel like he can complete without medication to help relax. Dr. Loleta Books notified and VO for 1 mg Ativan IV obtained.

## 2016-05-31 NOTE — H&P (Signed)
History and Physical  Patient Name: Richard Patterson     [id number]    DOB: June 06, 1976    DOA: 05/30/2016 PCP: No primary care provider on file.   Patient coming from: Home     Chief Complaint: Chest pain  HPI: Richard Patterson is a 40 y.o. male with a past medical history significant for HTN who presents with chest pain and malaise tonight.  The patient was in his usual state of health until last night at work (he works 3rd shift, [employer] to [place]) when around [place] he got up from his station and felt weak, blurry vision, "swimmy headed" and then had a headache.  This resolved on its own after a few minutes.  Today, he got home from work, and slept on and off all day.  Around 10PM he got up to go to the bathroom and felt dizzy and blurry vision again, then had sharp left sided chest pain, moderate in intensity, not radiating, with no associated symptoms.  He checked his BP with his aunt's machine, but this only registered "39/30" which he knew was wrong, so when his dizziness and malaise and blurry vision and headache returned, he went to the ER.  ED course: -Afebrile, heart rate 60s, respirations and pulse ox normal, BP 227/128 mmHg initially -Initial ECG showed inferolateral TWI and right axis/LPFB without previous for comparison and troponin was negative. -Na 139, K 4.0, Cr 0.8 (baseline unknown), WBC 9.7, Hgb 17.7 -BNP 106 -CXR without edema and CT head normal -TRH was asked to evaluate for hypertensive urgency and chest pain    The patient moved here from [place] a year ago, has not had follow up here for HTN and does not take any BP meds since moving.  He thinks "amlodipine" sounds familiar, and knows he was on three BP meds, but does not know what medicines he took.          Review of Systems:  Review of Systems  Respiratory: Negative for cough, shortness of breath and wheezing.   Cardiovascular: Positive for chest pain. Negative for palpitations, orthopnea, claudication, leg swelling  and PND.  Neurological: Positive for dizziness and headaches. Negative for tingling, tremors, sensory change, speech change, focal weakness, seizures and loss of consciousness.  All other systems reviewed and are negative.    Past Medical History:  Diagnosis Date  . Essential hypertension     Past Surgical History:  Procedure Laterality Date  . CHOLECYSTECTOMY    . HAND TENDON SURGERY Right     Social History: Patient lives with his aunt.  He moved to [facility] from [place] where he is from about 18 months ago.  Patient walks unassisted.  He has a daughter in [place] in college.  He smokes.  He does not use alcohol or illicit drugs.    Allergies  Allergen Reactions  . Shellfish Allergy Hives and Rash    Family history: family history includes Cancer in his maternal grandfather and maternal grandmother; Hypertension in his father and mother.  Prior to Admission medications   None       Physical Exam: BP (!) 177/124   Pulse 74   Temp 97.9 F (36.6 C) (Oral)   Resp 15   Ht 6\' 1"  (1.854 m)   Wt 120.2 kg (265 lb)   SpO2 100%   BMI 34.96 kg/m  General appearance: Well-developed, adult male, alert and in mild distress from headache and nausea.   Eyes: Anicteric, conjunctiva pink, lids and lashes normal.  ENT: No nasal deformity, discharge, or epistaxis.  OP moist without lesions.   Skin: Warm and dry.   Cardiac: RRR, nl S1-S2, no murmurs appreciated.  Capillary refill is brisk.  JVP normal.  No LE edema.  Radial and DP pulses 2+ and symmetric.  No carotid bruits. Respiratory: Normal respiratory rate and rhythm.  CTAB without rales or wheezes. GI: Abdomen soft without rigidity.  No TTP. No ascites, distension.   MSK: No deformities or effusions.   Pain not reproduced with palpation of precordium.  No pain with arm movement. Neuro: Pupils are 3 mm and reactive to 2 mm.  Extraocular movements are intact, but with nystagmus L > R.  Cranial nerve 5 is within normal limits.   Cranial nerve 7 is symmetrical.  Cranial nerve 8 is within normal limits.  Cranial nerves 9 and 10 reveal equal palate elevation.  Cranial nerve 11 reveals sternocleidomastoid strong.  Cranial nerve 12 is midline.  Motor strength testing is 5/5 in the upper and lower extremities bilaterally with normal motor, tone and bulk. Sensory examination is intact to light touch.  Romberg maneuver is negative for pathology.  Finger-to-nose testing is within normal limits.  The patient is oriented to time, place and person.  Speech is fluent.  Naming is grossly intact.  Recall, recent and remote, as well as general fund of knowledge seem within normal limits.  Attention span and concentration are within normal limits. Psych: Behavior appropriate.  Affect normal.  No evidence of aural or visual hallucinations or delusions.       Labs on Admission:  The metabolic panel shows normal electrolytes and renal function. The complete blood count shows erythrocytosis, no leukocytosis or thrombocytopenia. The initial troponin is negative. BNP slightly elevated.  Radiological Exams on Admission: Personally reviewed CXR shows no focal opacity.  CT head report reviewed and shows NAICP: Dg Chest 2 View  Result Date: 05/31/2016 CLINICAL DATA:  High blood pressure and nausea tonight. Chest pain, shortness of breath, hypertension. EXAM: CHEST  2 VIEW COMPARISON:  None. FINDINGS: The heart size and mediastinal contours are within normal limits. Both lungs are clear. The visualized skeletal structures are unremarkable. IMPRESSION: No active cardiopulmonary disease. Electronically Signed   By: Lucienne Capers M.D.   On: 05/31/2016 01:30   Ct Head Wo Contrast  Result Date: 05/31/2016 CLINICAL DATA:  Headache and hypertension. EXAM: CT HEAD WITHOUT CONTRAST TECHNIQUE: Contiguous axial images were obtained from the base of the skull through the vertex without intravenous contrast. COMPARISON:  None. FINDINGS: Brain: No mass lesion,  intraparenchymal hemorrhage or extra-axial collection. No evidence of acute cortical infarct. Brain parenchyma and CSF-containing spaces are normal for age. Vascular: No hyperdense vessel or unexpected calcification. Skull: Normal visualized skull base, calvarium and extracranial soft tissues. Sinuses/Orbits: No sinus fluid levels or advanced mucosal thickening. No mastoid effusion. Normal orbits. IMPRESSION: Normal head CT. Electronically Signed   By: Ulyses Jarred M.D.   On: 05/31/2016 01:53    EKG: Independently reviewed. Rate 65, lateral and inferior TWI without previous for comparison.  No ST changes.  LPFB.    Assessment/Plan 1. Hypertensive urgency: Improved to 170s/110s with IV hydralazine and nitro drip in ER.  No evidence of pulmonary edema, encephalopathy, renal failure, myocardial infarction at present. -Discontinue nitroglycerin -Amlodipine 10 mg oral  -Will check MRI brain to rule out stroke given vertigo and asymmetric nystagmus    2. Chest pain and abnormal ECG:  Atypical symptoms.  Is a smoker and is obese,  and has HTN and severe range HTN tonight and with abnormal ECG, no previous for comparison.    -Serial troponins are ordered -Telemetry -Consult to cardiology, appreciate recommendations -Smoking cessation was recommended, nursing teaching ordered             DVT prophylaxis: Lovenox Diet: NPO after 4am for possible stress testing Code Status: FULL  Family Communication: None present  Disposition Plan: Anticipate overnight observation for arrhythmia on telemetry, serial troponins and subsequent risk stratification by Cardiology.  If testing negative, home after. Consults called: Cardiology consulted via [facility] Admission status: Telemetry, OBS   Medical decision making: Patient seen at 2:43 AM on 05/31/2016.  The patient was discussed with Dr. Leonides Schanz. What exists of the patient's chart was reviewed in depth.  Clinical condition: stable.       Edwin Dada [facility] Pager [phone]

## 2016-05-31 NOTE — Discharge Planning (Signed)
Dewaine Morocho J. Clydene Laming, RN, BSN, [place] [phone] ED CM consulted regarding PCP establishment and insurance enrollment. Pt presented to [facility] ED today with HTN. NCM met with pt at bedside; pt confirms not having access to f/u care with PCP or insurance coverage. Discussed with patient importance and benefits of establishing PCP, and not utilizing the ED for primary care needs. Pt verbalized understanding and is in agreement. Discussed other options, provided list of local  affordable PCPs.  Pt voiced interest in the [place].  [facility] Brochure given with address, phone number, and the services highlighted. Informed pt of appointment date (11/14) and time (2:00).  No further case management needs communicated at this time.

## 2016-06-01 ENCOUNTER — Encounter (HOSPITAL_COMMUNITY): Payer: Self-pay

## 2016-06-01 ENCOUNTER — Emergency Department (HOSPITAL_COMMUNITY)
Admission: EM | Admit: 2016-06-01 | Discharge: 2016-06-01 | Disposition: A | Discharge status: Home / Self Care | Payer: Self-pay | Attending: Emergency Medicine | Admitting: Emergency Medicine

## 2016-06-01 DIAGNOSIS — F17200 Nicotine dependence, unspecified, uncomplicated: Secondary | ICD-10-CM | POA: Insufficient documentation

## 2016-06-01 DIAGNOSIS — I10 Essential (primary) hypertension: Secondary | ICD-10-CM | POA: Insufficient documentation

## 2016-06-01 DIAGNOSIS — R51 Headache: Secondary | ICD-10-CM | POA: Insufficient documentation

## 2016-06-01 DIAGNOSIS — R519 Headache, unspecified: Secondary | ICD-10-CM

## 2016-06-01 MED ORDER — AMLODIPINE BESYLATE 5 MG PO TABS
10.0000 mg | ORAL_TABLET | Freq: Once | ORAL | Status: AC
Start: 2016-06-01 — End: 2016-06-01
  Administered 2016-06-01: 10 mg via ORAL
  Filled 2016-06-01: qty 2

## 2016-06-01 MED ORDER — DIPHENHYDRAMINE HCL 50 MG/ML IJ SOLN
25.0000 mg | Freq: Once | INTRAMUSCULAR | Status: AC
  Administered 2016-06-01: 25 mg via INTRAVENOUS
  Filled 2016-06-01: qty 1

## 2016-06-01 MED ORDER — METOCLOPRAMIDE HCL 5 MG/ML IJ SOLN
10.0000 mg | Freq: Once | INTRAMUSCULAR | Status: AC
  Administered 2016-06-01: 10 mg via INTRAVENOUS
  Filled 2016-06-01: qty 2

## 2016-06-01 MED ORDER — DIPHENHYDRAMINE HCL 25 MG PO CAPS: 25.0000 mg | ORAL_CAPSULE | Freq: Four times a day (QID) | ORAL | 0 refills | Status: DC | PRN

## 2016-06-01 MED ORDER — METOCLOPRAMIDE HCL 10 MG PO TABS: 10.0000 mg | ORAL_TABLET | Freq: Four times a day (QID) | ORAL | 0 refills | Status: DC

## 2016-06-01 NOTE — ED Notes (Signed)
MD made aware of the patient's onset of symptoms

## 2016-06-01 NOTE — ED Triage Notes (Signed)
Per EMS, Pt had a sudden onset of headache at 1000. Pt was seen for the same thing on Monday and was sent home with medication for HTN. Pt has not been able to get prescriptions filled yet so he has not taken medication. Pt had a new onset today while picking up car from the airport. Reported Dizziness with headache, nausea and vomiting prior to EMS arrival. EMS found pt to be HTN. No changes on EKG. 194/119, 71 HR, 95% on RA, 18 RR, 122 CBG, 18 Gauge Left AC. Negative Stroke Screen. Denies blurred vision, double vision, or any other symptoms.

## 2016-06-01 NOTE — ED Notes (Signed)
MD Pfeiffer at the bedside 

## 2016-06-01 NOTE — ED Provider Notes (Signed)
[facility] DEPT Provider Note   CSN: [id number] Arrival date & time: 06/01/16  1259     History   Chief Complaint Chief Complaint  Patient presents with  . Headache  . Hypertension    HPI Richard Patterson is a 40 y.o. male.  HPI Patient had recent hospitalization for hypertension with associated symptoms. Patient reports while at the hospital he felt fine. Note he did request discharge earlier than planned. Patient reports that he was not able to pick up his blood pressure medications after his discharge. He developed a headache yesterday at about 1 in the morning. He reports that he was not embedded that time, he is working in a night shift. Headache is frontal and throbbing in quality. It eased off after a while and he resumed his usual activity. He reports today, he needed to go to the airport to pick up a car and his headache suddenly got much worse again and he felt that his vision got blurry and he was dizzy. No focal weakness numbness or tingling. He notes a headache to predominantly be a throbbing frontal quality. No nausea or vomiting. No fever chills or neck stiffness.  Family history is negative for aneurysm or brain tumor. He does report his mother suffers from migraines. Past Medical History:  Diagnosis Date  . Essential hypertension     Patient Active Problem List   Diagnosis Date Noted  . Hypertensive urgency 05/31/2016  . Chest pain 05/31/2016  . Essential hypertension 05/31/2016    Past Surgical History:  Procedure Laterality Date  . CHOLECYSTECTOMY    . HAND TENDON SURGERY Right        Home Medications    Prior to Admission medications   Medication Sig Start Date End Date Taking? Authorizing Provider  amLODipine (NORVASC) 10 MG tablet Take 1 tablet (10 mg total) by mouth daily. 06/01/16   Mariel Aloe, MD  diphenhydrAMINE (BENADRYL) 25 mg capsule Take 1 capsule (25 mg total) by mouth every 6 (six) hours as needed. With  reglan for headache. 06/01/16    Charlesetta Shanks, MD  hydrochlorothiazide (HYDRODIURIL) 25 MG tablet Take 1 tablet (25 mg total) by mouth daily. 05/31/16   Mariel Aloe, MD  metoCLOPramide (REGLAN) 10 MG tablet Take 1 tablet (10 mg total) by mouth every 6 (six) hours. For headache, take benadryl with this medication. 06/01/16   Charlesetta Shanks, MD    Family History Family History  Problem Relation Age of Onset  . Hypertension Mother   . Hypertension Father   . Cancer Maternal Grandmother   . Cancer Maternal Grandfather     Social History Social History  Substance Use Topics  . Smoking status: Current Every Day Smoker  . Smokeless tobacco: Never Used  . Alcohol use No     Allergies   Shellfish allergy   Review of Systems Review of Systems 10 Systems reviewed and are negative for acute change except as noted in the HPI.   Physical Exam Updated Vital Signs BP (!) 159/101   Pulse 82   Temp 97.9 F (36.6 C)   Resp 20   Ht 6\' 1"  (1.854 m)   Wt 265 lb (120.2 kg)   SpO2 98%   BMI 34.96 kg/m   Physical Exam  Constitutional: He appears well-developed and well-nourished.  HENT:  Head: Normocephalic and atraumatic.  Eyes: Conjunctivae are normal.  Neck: Neck supple.  Cardiovascular: Normal rate and regular rhythm.   No murmur heard. Pulmonary/Chest: Effort normal and breath  sounds normal. No respiratory distress.  Abdominal: Soft. There is no tenderness.  Musculoskeletal: He exhibits no edema.  Neurological: He is alert.  Skin: Skin is warm and dry.  Psychiatric: He has a normal mood and affect.  Nursing note and vitals reviewed.    ED Treatments / Results  Labs (all labs ordered are listed, but only abnormal results are displayed) Labs Reviewed - No data to display  EKG  EKG Interpretation None       Radiology Dg Chest 2 View  Result Date: 05/31/2016 CLINICAL DATA:  High blood pressure and nausea tonight. Chest pain, shortness of breath, hypertension. EXAM: CHEST  2 VIEW COMPARISON:   None. FINDINGS: The heart size and mediastinal contours are within normal limits. Both lungs are clear. The visualized skeletal structures are unremarkable. IMPRESSION: No active cardiopulmonary disease. Electronically Signed   By: Lucienne Capers M.D.   On: 05/31/2016 01:30   Ct Head Wo Contrast  Result Date: 05/31/2016 CLINICAL DATA:  Headache and hypertension. EXAM: CT HEAD WITHOUT CONTRAST TECHNIQUE: Contiguous axial images were obtained from the base of the skull through the vertex without intravenous contrast. COMPARISON:  None. FINDINGS: Brain: No mass lesion, intraparenchymal hemorrhage or extra-axial collection. No evidence of acute cortical infarct. Brain parenchyma and CSF-containing spaces are normal for age. Vascular: No hyperdense vessel or unexpected calcification. Skull: Normal visualized skull base, calvarium and extracranial soft tissues. Sinuses/Orbits: No sinus fluid levels or advanced mucosal thickening. No mastoid effusion. Normal orbits. IMPRESSION: Normal head CT. Electronically Signed   By: Ulyses Jarred M.D.   On: 05/31/2016 01:53   Mr Brain Wo Contrast  Result Date: 05/31/2016 CLINICAL DATA:  Vertigo EXAM: MRI HEAD WITHOUT CONTRAST TECHNIQUE: Multiplanar, multiecho pulse sequences of the brain and surrounding structures were obtained without intravenous contrast. COMPARISON:  CT head 05/31/2016 FINDINGS: Image quality degraded by motion which became progressively worse during the study. Brain: Ventricle size normal. Cerebral volume normal. Pituitary normal in size. Negative for acute infarct. Solitary hyperintensity in the left lateral putamen likely an area of chronic infarction. Cerebral white matter normal. Brainstem and cerebellum normal. Negative for hemorrhage or fluid collection. Negative for mass or edema. No shift of the midline structures. Vascular: Normal flow voids Skull and upper cervical spine: Negative Sinuses/Orbits: Negative Other: None IMPRESSION: No acute  intracranial abnormality. Solitary small hyperintensity left putamen most consistent with chronic ischemia. Electronically Signed   By: Franchot Gallo M.D.   On: 05/31/2016 08:23    Procedures Procedures (including critical care time)  Medications Ordered in ED Medications  metoCLOPramide (REGLAN) injection 10 mg (10 mg Intravenous Given 06/01/16 1410)  diphenhydrAMINE (BENADRYL) injection 25 mg (25 mg Intravenous Given 06/01/16 1412)  amLODipine (NORVASC) tablet 10 mg (10 mg Oral Given 06/01/16 1409)     Initial Impression / Assessment and Plan / ED Course  I have reviewed the triage vital signs and the nursing notes.  Pertinent labs & imaging results that were available during my care of the patient were reviewed by me and considered in my medical decision making (see chart for details).  Clinical Course    Recheck 15:20. Headache is improved and patient feels much better. He remains neurologically intact.  Final Clinical Impressions(s) / ED Diagnoses   Final diagnoses:  Essential hypertension  Nonintractable episodic headache, unspecified headache type   Patient had diagnostic evaluation including MRI for similar symptoms. At this time, there are no focal neurologic deficits. Hypertensive urgency is a consideration, however with patient being untreated  for for a  years duration, it seems somewhat less likely that the patient's current blood pressures are precipitating and hypertensive emergency. Patient's primary neurologic symptoms are feelings of dizziness and blurred vision. Consideration is given to possible migraine type headache as well, patient does have positive family history of mother with migraines. He did respond very positively to the administration of Reglan and Benadryl good resolution of headache. Norvasc has been administered and the patient reports he is able to go pick up his prescriptions today. He has follow-up appointments arranged from his discharge and is  counseled on signs and symptoms for which to return. New Prescriptions New Prescriptions   DIPHENHYDRAMINE (BENADRYL) 25 MG CAPSULE    Take 1 capsule (25 mg total) by mouth every 6 (six) hours as needed. With  reglan for headache.   METOCLOPRAMIDE (REGLAN) 10 MG TABLET    Take 1 tablet (10 mg total) by mouth every 6 (six) hours. For headache, take benadryl with this medication.     Charlesetta Shanks, MD 06/01/16 [phone]

## 2016-06-07 ENCOUNTER — Encounter: Payer: Self-pay | Admitting: Family Medicine

## 2016-06-07 ENCOUNTER — Ambulatory Visit (INDEPENDENT_AMBULATORY_CARE_PROVIDER_SITE_OTHER): Payer: Self-pay | Admitting: Family Medicine

## 2016-06-07 VITALS — BP 147/98 | HR 89 | Temp 98.0°F | Resp 18 | Ht 74.0 in | Wt 263.0 lb

## 2016-06-07 DIAGNOSIS — I10 Essential (primary) hypertension: Secondary | ICD-10-CM

## 2016-06-07 LAB — TSH: TSH: 3.18 mIU/L (ref 0.40–4.50)

## 2016-06-07 LAB — HEMOGLOBIN A1C
Hgb A1c MFr Bld: 4.9 % (ref <5.7)
Mean Plasma Glucose: 94 mg/dL

## 2016-06-07 LAB — LIPID PANEL
Cholesterol: 154 mg/dL (ref <200)
HDL: 35 mg/dL — ABNORMAL LOW (ref >40)
LDL Cholesterol: 59 mg/dL (ref <100)
Total CHOL/HDL Ratio: 4.4 Ratio (ref <5.0)
Triglycerides: 298 mg/dL — ABNORMAL HIGH (ref <150)
VLDL: 60 mg/dL — ABNORMAL HIGH (ref <30)

## 2016-06-07 MED ORDER — AMLODIPINE BESYLATE 10 MG PO TABS: 10.0000 mg | ORAL_TABLET | Freq: Every day | ORAL | 1 refills | Status: DC

## 2016-06-07 MED ORDER — LISINOPRIL-HYDROCHLOROTHIAZIDE 20-25 MG PO TABS: 1.0000 | ORAL_TABLET | Freq: Every day | ORAL | 1 refills | Status: DC

## 2016-06-07 NOTE — Progress Notes (Signed)
Patient is here to Establish CARE.  Patient has taken medication today. Patient has eaten today.  Patient states pain and dizziness stopped yesterday.  Patient has checked his BP at home since last Thursday and states the numbers do not go below 170.

## 2016-06-08 NOTE — Progress Notes (Signed)
Richard Patterson, is a 40 y.o. male  [id number]  [id number]  DOB - [birth year]  CC:  Chief Complaint  Patient presents with  . Establish Care       HPI: Richard Patterson is a 40 y.o. male here to establish care. He has a diagnosis of hypertension. He reports that when he takes his BP at home, it is usually 170 or higher. He is currently taking amlodipine 10 andHCTZ 25. His BP today is 147/98. He reports feeling well. He does not follow a low salt diet and does not exercise regularly. He has a history of headaches and takes Reglan and Benadryl.    Allergies  Allergen Reactions  . Shellfish Allergy Hives and Rash   Past Medical History:  Diagnosis Date  . Essential hypertension    Current Outpatient Prescriptions on File Prior to Visit  Medication Sig Dispense Refill  . diphenhydrAMINE (BENADRYL) 25 mg capsule Take 1 capsule (25 mg total) by mouth every 6 (six) hours as needed. With  reglan for headache. 30 capsule 0  . metoCLOPramide (REGLAN) 10 MG tablet Take 1 tablet (10 mg total) by mouth every 6 (six) hours. For headache, take benadryl with this medication. 30 tablet 0   No current facility-administered medications on file prior to visit.    Family History  Problem Relation Age of Onset  . Hypertension Mother   . Hypertension Father   . Cancer Maternal Grandmother   . Cancer Maternal Grandfather    Social History   Social History  . Marital status: Single    Spouse name: N/A  . Number of children: N/A  . Years of education: N/A   Occupational History  . Not on file.   Social History Main Topics  . Smoking status: Current Every Day Smoker  . Smokeless tobacco: Never Used  . Alcohol use No  . Drug use: No  . Sexual activity: Not on file   Other Topics Concern  . Not on file   Social History Narrative  . No narrative on file    Review of Systems: Constitutional: Negative Skin: Negative HENT: Negative  Eyes: Negative  Neck: Negative Respiratory:  Negative Cardiovascular: Negative Gastrointestinal: Negative Genitourinary: Negative  Musculoskeletal: Negative   Neurological: Negative for Hematological: Negative  Psychiatric/Behavioral: Negative    Objective:   Vitals:   06/07/16 1403  BP: (!) 147/98  Pulse: 89  Resp: 18  Temp: 98 F (36.7 C)    Physical Exam: Constitutional: Patient appears well-developed and well-nourished. No distress. HENT: Normocephalic, atraumatic, External right and left ear normal. Oropharynx is clear and moist.  Eyes: Conjunctivae and EOM are normal. PERRLA, no scleral icterus. Neck: Normal ROM. Neck supple. No lymphadenopathy, No thyromegaly. CVS: RRR, S1/S2 +, no murmurs, no gallops, no rubs Pulmonary: Effort and breath sounds normal, no stridor, rhonchi, wheezes, rales.  Abdominal: Soft. Normoactive BS,, no distension, tenderness, rebound or guarding.  Musculoskeletal: Normal range of motion. No edema and no tenderness.  Neuro: Alert.Normal muscle tone coordination. Non-focal Skin: Skin is warm and dry. No rash noted. Not diaphoretic. No erythema. No pallor. Psychiatric: Normal mood and affect. Behavior, judgment, thought content normal.  Lab Results  Component Value Date   WBC 9.7 05/30/2016   HGB 17.7 (H) 05/30/2016   HCT 50.2 05/30/2016   MCV 89.3 05/30/2016   PLT 181 05/30/2016   Lab Results  Component Value Date   CREATININE 0.80 05/30/2016   BUN 12 05/30/2016   NA 139 05/30/2016   K  4.0 05/30/2016   CL 109 05/30/2016   CO2 23 05/30/2016    Lab Results  Component Value Date   HGBA1C 4.9 06/07/2016   Lipid Panel     Component Value Date/Time   CHOL 154 06/07/2016 1433   TRIG 298 (H) 06/07/2016 1433   HDL 35 (L) 06/07/2016 1433   CHOLHDL 4.4 06/07/2016 1433   VLDL 60 (H) 06/07/2016 1433   LDLCALC 59 06/07/2016 1433        Assessment and plan:   1. Essential hypertension  - Hemoglobin A1c - Lipid panel - TSH   Return in about 3 months (around  09/07/2016).  The patient was given clear instructions to go to ER or return to medical center if symptoms don't improve, worsen or new problems develop. The patient verbalized understanding.    Micheline Chapman FNP  06/14/2016, 11:31 AM

## 2016-06-13 ENCOUNTER — Ambulatory Visit: Payer: Self-pay | Admitting: Physician Assistant

## 2016-06-13 NOTE — Progress Notes (Deleted)
Cardiology Office Note:    Date:  06/13/2016   ID:  Richard Patterson, DOB 03/02/1976, MRN [id number]  PCP:  Sharon Seller, NP  Cardiologist:  Dr. Sherren Mocha   Electrophysiologist:  n/a  Referring MD: No ref. provider found   No chief complaint on file. ***  History of Present Illness:    Richard Patterson is a 40 y.o. male with a hx of HTN, tobacco abuse.  He was seen by Dr. Sherren Mocha in the [facility] ED on 05/31/16 for HTN urgency. He had been out of BP medications for 1 year.  [place]  He presented with HAs and dizziness.  His BP was 220/120.  ECG demonstrated deep inferolateral TWI with RAD.  This was thought to likely be LVH with repolarization abnormality.  Troponin levels were negative.  CXR was unremarkable. He also c/o atypical chest pain. Recommendation was to arrange primary care follow up to manage his BP and schedule an OP pharmacologic stress test given his abnormal ECG.    Stress test was never arranged.    ***  Prior CV studies that were reviewed today include:    None   Past Medical History:  Diagnosis Date  . Essential hypertension     Past Surgical History:  Procedure Laterality Date  . CHOLECYSTECTOMY    . HAND TENDON SURGERY Right     Current Medications: No outpatient prescriptions have been marked as taking for the 06/13/16 encounter (Appointment) with Liliane Shi, PA-C.     Allergies:   Shellfish allergy   Social History   Social History  . Marital status: Single    Spouse name: N/A  . Number of children: N/A  . Years of education: N/A   Social History Main Topics  . Smoking status: Current Every Day Smoker  . Smokeless tobacco: Never Used  . Alcohol use No  . Drug use: No  . Sexual activity: Not on file   Other Topics Concern  . Not on file   Social History Narrative  . No narrative on file     Family History:  The patient's ***family history includes Cancer in his maternal grandfather  and maternal grandmother; Hypertension in his father and mother.   ROS:   Please see the history of present illness.    ROS All other systems reviewed and are negative.   EKGs/Labs/Other Test Reviewed:    EKG:  EKG is *** ordered today.  The ekg ordered today demonstrates ***  Recent Labs: 05/30/2016: B Natriuretic Peptide 106.0; BUN 12; Creatinine, Ser 0.80; Hemoglobin 17.7; Platelets 181; Potassium 4.0; Sodium 139 06/07/2016: TSH 3.18   Recent Lipid Panel    Component Value Date/Time   CHOL 154 06/07/2016 1433   TRIG 298 (H) 06/07/2016 1433   HDL 35 (L) 06/07/2016 1433   CHOLHDL 4.4 06/07/2016 1433   VLDL 60 (H) 06/07/2016 1433   LDLCALC 59 06/07/2016 1433     Physical Exam:    VS:  There were no vitals taken for this visit.    Wt Readings from Last 3 Encounters:  06/07/16 263 lb (119.3 kg)  06/01/16 265 lb (120.2 kg)  05/30/16 265 lb (120.2 kg)     ***Physical Exam  ASSESSMENT:    No diagnosis found. PLAN:    In order of problems listed above:  1. HTN heart disease - *** 2. Abnormal ECG - ***   Medication Adjustments/Labs and Tests Ordered: Current medicines are reviewed  at length with the patient today.  Concerns regarding medicines are outlined above.  Medication changes, Labs and Tests ordered today are outlined in the Patient Instructions noted below. There are no Patient Instructions on file for this visit. Signed, Richardson Dopp, PA-C  06/13/2016 2:05 PM    [facility] [place], [place],   [address] Phone: [phone]; Fax: [phone]

## 2016-06-15 ENCOUNTER — Encounter: Payer: Self-pay | Admitting: Physician Assistant

## 2016-09-19 ENCOUNTER — Ambulatory Visit: Payer: Self-pay | Admitting: Family Medicine

## 2018-03-04 ENCOUNTER — Emergency Department (HOSPITAL_COMMUNITY): Payer: Self-pay

## 2018-03-04 ENCOUNTER — Emergency Department (HOSPITAL_COMMUNITY)
Admission: EM | Admit: 2018-03-04 | Discharge: 2018-03-04 | Disposition: A | Discharge status: Home / Self Care | Payer: Self-pay | Attending: Emergency Medicine | Admitting: Emergency Medicine

## 2018-03-04 ENCOUNTER — Encounter (HOSPITAL_COMMUNITY): Payer: Self-pay | Admitting: Emergency Medicine

## 2018-03-04 DIAGNOSIS — S0502XA Injury of conjunctiva and corneal abrasion without foreign body, left eye, initial encounter: Secondary | ICD-10-CM | POA: Insufficient documentation

## 2018-03-04 DIAGNOSIS — X58XXXA Exposure to other specified factors, initial encounter: Secondary | ICD-10-CM | POA: Insufficient documentation

## 2018-03-04 DIAGNOSIS — I10 Essential (primary) hypertension: Secondary | ICD-10-CM | POA: Insufficient documentation

## 2018-03-04 DIAGNOSIS — Y92009 Unspecified place in unspecified non-institutional (private) residence as the place of occurrence of the external cause: Secondary | ICD-10-CM | POA: Insufficient documentation

## 2018-03-04 DIAGNOSIS — Y939 Activity, unspecified: Secondary | ICD-10-CM | POA: Insufficient documentation

## 2018-03-04 DIAGNOSIS — F17210 Nicotine dependence, cigarettes, uncomplicated: Secondary | ICD-10-CM | POA: Insufficient documentation

## 2018-03-04 DIAGNOSIS — R0602 Shortness of breath: Secondary | ICD-10-CM

## 2018-03-04 DIAGNOSIS — Z79899 Other long term (current) drug therapy: Secondary | ICD-10-CM | POA: Insufficient documentation

## 2018-03-04 DIAGNOSIS — T7840XA Allergy, unspecified, initial encounter: Secondary | ICD-10-CM | POA: Insufficient documentation

## 2018-03-04 DIAGNOSIS — Y999 Unspecified external cause status: Secondary | ICD-10-CM | POA: Insufficient documentation

## 2018-03-04 DIAGNOSIS — H1013 Acute atopic conjunctivitis, bilateral: Secondary | ICD-10-CM | POA: Insufficient documentation

## 2018-03-04 LAB — COMPREHENSIVE METABOLIC PANEL
ALT: 43 U/L (ref 0–44)
AST: 40 U/L (ref 15–41)
Albumin: 4.4 g/dL (ref 3.5–5.0)
Alkaline Phosphatase: 78 U/L (ref 38–126)
Anion gap: 13 (ref 5–15)
BUN: 11 mg/dL (ref 6–20)
CO2: 26 mmol/L (ref 22–32)
Calcium: 9.5 mg/dL (ref 8.9–10.3)
Chloride: 102 mmol/L (ref 98–111)
Creatinine, Ser: 0.91 mg/dL (ref 0.61–1.24)
GFR calc Af Amer: >60 mL/min (ref >60)
GFR calc non Af Amer: >60 mL/min (ref >60)
Glucose, Bld: 103 mg/dL — ABNORMAL HIGH (ref 70–99)
Potassium: 4.3 mmol/L (ref 3.5–5.1)
Sodium: 141 mmol/L (ref 135–145)
Total Bilirubin: 1.3 mg/dL — ABNORMAL HIGH (ref 0.3–1.2)
Total Protein: 8 g/dL (ref 6.5–8.1)

## 2018-03-04 LAB — CBC WITH DIFFERENTIAL/PLATELET
Abs Immature Granulocytes: 0.1 10*3/uL (ref 0.0–0.1)
Basophils Absolute: 0.1 10*3/uL (ref 0.0–0.1)
Basophils Relative: 1 %
Eosinophils Absolute: 0.2 10*3/uL (ref 0.0–0.7)
Eosinophils Relative: 1 %
HCT: 56 % — ABNORMAL HIGH (ref 39.0–52.0)
Hemoglobin: 19.3 g/dL — ABNORMAL HIGH (ref 13.0–17.0)
Immature Granulocytes: 1 %
Lymphocytes Relative: 22 %
Lymphs Abs: 2.4 10*3/uL (ref 0.7–4.0)
MCH: 31.7 pg (ref 26.0–34.0)
MCHC: 34.5 g/dL (ref 30.0–36.0)
MCV: 92 fL (ref 78.0–100.0)
Monocytes Absolute: 0.8 10*3/uL (ref 0.1–1.0)
Monocytes Relative: 8 %
Neutro Abs: 7.5 10*3/uL (ref 1.7–7.7)
Neutrophils Relative %: 69 %
Platelets: 200 10*3/uL (ref 150–400)
RBC: 6.09 MIL/uL — ABNORMAL HIGH (ref 4.22–5.81)
RDW: 13.6 % (ref 11.5–15.5)
WBC: 10.9 10*3/uL — ABNORMAL HIGH (ref 4.0–10.5)

## 2018-03-04 LAB — TROPONIN I
Troponin I: <0.03 ng/mL (ref <0.03)
Troponin I: <0.03 ng/mL (ref <0.03)

## 2018-03-04 MED ORDER — PREDNISONE 20 MG PO TABS: 40.0000 mg | ORAL_TABLET | Freq: Every day | ORAL | 0 refills | Status: DC

## 2018-03-04 MED ORDER — LISINOPRIL-HYDROCHLOROTHIAZIDE 20-25 MG PO TABS: 1.0000 | ORAL_TABLET | Freq: Every day | ORAL | Status: DC

## 2018-03-04 MED ORDER — LISINOPRIL 20 MG PO TABS: 20.0000 mg | ORAL_TABLET | Freq: Every day | ORAL | 1 refills | Status: DC

## 2018-03-04 MED ORDER — ERYTHROMYCIN 5 MG/GM OP OINT
1.0000 "application " | TOPICAL_OINTMENT | Freq: Once | OPHTHALMIC | Status: AC
  Administered 2018-03-04: 1 via OPHTHALMIC
  Filled 2018-03-04: qty 3.5

## 2018-03-04 MED ORDER — AMLODIPINE BESYLATE 5 MG PO TABS
10.0000 mg | ORAL_TABLET | Freq: Every day | ORAL | Status: DC
  Administered 2018-03-04: 10 mg via ORAL
  Filled 2018-03-04: qty 2

## 2018-03-04 MED ORDER — HYDROCHLOROTHIAZIDE 25 MG PO TABS
25.0000 mg | ORAL_TABLET | Freq: Every day | ORAL | Status: DC
  Administered 2018-03-04: 25 mg via ORAL
  Filled 2018-03-04: qty 1

## 2018-03-04 MED ORDER — HYDROCHLOROTHIAZIDE 25 MG PO TABS: 25.0000 mg | ORAL_TABLET | Freq: Every day | ORAL | 1 refills | Status: DC

## 2018-03-04 MED ORDER — DIPHENHYDRAMINE HCL 50 MG/ML IJ SOLN
50.0000 mg | Freq: Once | INTRAMUSCULAR | Status: AC
  Administered 2018-03-04: 50 mg via INTRAVENOUS
  Filled 2018-03-04: qty 1

## 2018-03-04 MED ORDER — EPINEPHRINE 0.3 MG/0.3ML IJ SOAJ
0.3000 mg | Freq: Once | INTRAMUSCULAR | Status: AC
  Administered 2018-03-04: 0.3 mg via INTRAMUSCULAR

## 2018-03-04 MED ORDER — LISINOPRIL 20 MG PO TABS
20.0000 mg | ORAL_TABLET | Freq: Every day | ORAL | Status: DC
  Administered 2018-03-04: 20 mg via ORAL
  Filled 2018-03-04: qty 1

## 2018-03-04 MED ORDER — FLUORESCEIN SODIUM 1 MG OP STRP
1.0000 | ORAL_STRIP | Freq: Once | OPHTHALMIC | Status: AC
Start: 2018-03-04 — End: 2018-03-04
  Administered 2018-03-04: 1 via OPHTHALMIC
  Filled 2018-03-04: qty 1

## 2018-03-04 MED ORDER — TETRACAINE HCL 0.5 % OP SOLN
1.0000 [drp] | Freq: Once | OPHTHALMIC | Status: AC
Start: 2018-03-04 — End: 2018-03-04
  Administered 2018-03-04: 1 [drp] via OPHTHALMIC
  Filled 2018-03-04: qty 4

## 2018-03-04 MED ORDER — FAMOTIDINE IN NACL 20-0.9 MG/50ML-% IV SOLN
20.0000 mg | INTRAVENOUS | Status: AC
  Administered 2018-03-04: 20 mg via INTRAVENOUS
  Filled 2018-03-04: qty 50

## 2018-03-04 MED ORDER — SODIUM CHLORIDE 0.9 % IV BOLUS
1000.0000 mL | Freq: Once | INTRAVENOUS | Status: AC
  Administered 2018-03-04: 1000 mL via INTRAVENOUS

## 2018-03-04 MED ORDER — EPINEPHRINE 0.3 MG/0.3ML IJ SOAJ
INTRAMUSCULAR | Status: AC
  Filled 2018-03-04: qty 0.3

## 2018-03-04 MED ORDER — METHYLPREDNISOLONE SODIUM SUCC 125 MG IJ SOLR
125.0000 mg | Freq: Once | INTRAMUSCULAR | Status: AC
  Administered 2018-03-04: 125 mg via INTRAVENOUS
  Filled 2018-03-04: qty 2

## 2018-03-04 MED ORDER — ACETAMINOPHEN 500 MG PO TABS
1000.0000 mg | ORAL_TABLET | Freq: Once | ORAL | Status: AC
  Administered 2018-03-04: 1000 mg via ORAL
  Filled 2018-03-04: qty 2

## 2018-03-04 NOTE — ED Provider Notes (Signed)
[facility] EMERGENCY DEPARTMENT Provider Note   CSN: [id number] Arrival date & time: 03/04/18  1443     History   Chief Complaint Chief Complaint  Patient presents with  . Facial Swelling  . Shortness of Breath  . Chest Pain    HPI Richard Patterson is a 42 y.o. male.  HPI  Patient is a 42 year old male, he does have a history of high blood pressure, he cannot recall the name of his medications but based on his list he takes lisinopril hydrochlorothiazide and Norvasc.  He presents to the hospital today with a complaint of shortness of breath chest discomfort swelling and burning of his eyes.  He reports that last night as he was laying down he developed a feeling of bilateral eye burning, he felt like his facial tissue started to swell in the periorbital region and as the night progressed he began to have more swelling, foreign body sensations in both eyes and then swelling around the back of his throat with changes in his voice.  He then started to get short of breath and have a chest tightness which has proceeded throughout the morning.  No medications given prior to arrival, no history of allergic reactions like this in the past.  He did have a reaction to shrimp when he held it in his hands prior to eating several years ago when his hands became red hot and itchy.  He did not have any shellfish yesterday, he ate a steak and cheese sandwich.  Denies any insect bites, recent moves, recent topical products, recent over-the-counter medications or any other environmental exposures.  He denies any itching at this time but has significant swelling around his eyes and the back of his throat.  No coughing, no fever, no swelling of the legs.  No diarrhea, no dysuria, no abdominal pain.  Past Medical History:  Diagnosis Date  . Essential hypertension     Patient Active Problem List   Diagnosis Date Noted  . Hypertensive urgency 05/31/2016  . Chest pain 05/31/2016  .  Essential hypertension 05/31/2016    Past Surgical History:  Procedure Laterality Date  . CHOLECYSTECTOMY    . HAND TENDON SURGERY Right         Home Medications    Prior to Admission medications   Medication Sig Start Date End Date Taking? Authorizing Provider  amLODipine (NORVASC) 10 MG tablet Take 1 tablet (10 mg total) by mouth daily. 06/07/16   Micheline Chapman, NP  diphenhydrAMINE (BENADRYL) 25 mg capsule Take 1 capsule (25 mg total) by mouth every 6 (six) hours as needed. With  reglan for headache. 06/01/16   Charlesetta Shanks, MD  lisinopril-hydrochlorothiazide (PRINZIDE,ZESTORETIC) 20-25 MG tablet Take 1 tablet by mouth daily. 06/07/16   Micheline Chapman, NP  metoCLOPramide (REGLAN) 10 MG tablet Take 1 tablet (10 mg total) by mouth every 6 (six) hours. For headache, take benadryl with this medication. 06/01/16   Charlesetta Shanks, MD    Family History Family History  Problem Relation Age of Onset  . Hypertension Mother   . Hypertension Father   . Cancer Maternal Grandmother   . Cancer Maternal Grandfather     Social History Social History   Tobacco Use  . Smoking status: Current Every Day Smoker  . Smokeless tobacco: Never Used  Substance Use Topics  . Alcohol use: No  . Drug use: No     Allergies   Shellfish allergy   Review of Systems Review of Systems  All other systems reviewed and are negative.    Physical Exam Updated Vital Signs BP (!) 209/127 (BP Location: Left Arm)   Pulse 95   Temp 98.6 F (37 C) (Oral)   Resp (!) 23   SpO2 99%   Physical Exam  Constitutional: He appears well-developed and well-nourished. No distress.  HENT:  Head: Normocephalic and atraumatic.  Mouth/Throat: Oropharynx is clear and moist. No oropharyngeal exudate.  No swelling in the posterior pharynx, phonation is normal, uvula is normal, tongue is normal and lips are normal.  Rating secretions  Eyes: Pupils are equal, round, and reactive to light. EOM are normal.  Right eye exhibits no discharge. Left eye exhibits no discharge. No scleral icterus.  Bilateral conjunctival injection with chemosis and mild periorbital edema  Neck: Normal range of motion. Neck supple. No JVD present. No thyromegaly present.  Cardiovascular: Normal rate, regular rhythm, normal heart sounds and intact distal pulses. Exam reveals no gallop and no friction rub.  No murmur heard. Pulmonary/Chest: Effort normal and breath sounds normal. No respiratory distress. He has no wheezes. He has no rales.  Abdominal: Soft. Bowel sounds are normal. He exhibits no distension and no mass. There is no tenderness.  Musculoskeletal: Normal range of motion. He exhibits no edema or tenderness.  Lymphadenopathy:    He has no cervical adenopathy.  Neurological: He is alert. Coordination normal.  Skin: Skin is warm and dry. No rash noted. No erythema.  Psychiatric: He has a normal mood and affect. His behavior is normal.  Nursing note and vitals reviewed.    ED Treatments / Results  Labs (all labs ordered are listed, but only abnormal results are displayed) Labs Reviewed  CBC WITH DIFFERENTIAL/PLATELET  COMPREHENSIVE METABOLIC PANEL  TROPONIN I    EKG EKG Interpretation  Date/Time:  Sunday March 04 2018 07:30:01 EDT Ventricular Rate:  76 PR Interval:  134 QRS Duration: 90 QT Interval:  428 QTC Calculation: 481 R Axis:   77 Text Interpretation:  Normal sinus rhythm ST & T wave abnormality, consider inferolateral ischemia Prolonged QT Abnormal ECG c/w 05/30/16, no changes seen Confirmed by Noemi Chapel [phone]) on 03/04/2018 7:43:09 AM   Radiology No results found.  Procedures Procedures (including critical care time)  Medications Ordered in ED Medications  methylPREDNISolone sodium succinate (SOLU-MEDROL) 125 mg/2 mL injection 125 mg (has no administration in time range)  diphenhydrAMINE (BENADRYL) injection 50 mg (has no administration in time range)  famotidine (PEPCID)  IVPB 20 mg premix (has no administration in time range)     Initial Impression / Assessment and Plan / ED Course  I have reviewed the triage vital signs and the nursing notes.  Pertinent labs & imaging results that were available during my care of the patient were reviewed by me and considered in my medical decision making (see chart for details).    The lungs are clear, the heart is regular, his EKG shows signs of left ventricular hypertrophy which is unchanged since 2 years ago, I suspect that his symptoms are all related to some type of allergy.  He will be given medications to treat the same and with his feelings of choking, change in voice and throat swelling he will be given epinephrine.  The patient does not appear to have an ischemic event however given his anaphylactoid type reaction he will need these medications with observation to make sure he improves.  Doubt that there is foreign body given his bilateral chemosis with the rest of his allergy  symptoms but tetracaine and fluorescein exam will be performed to rule out other causes.  The patient has on my exam immediate pain relief with tetracaine bilaterally, his right eye has no signs of corneal abrasion, the left eye has a nasal surface corneal abrasion at the 9 o'clock position, there is no foreign bodies under either of the eyelids, his pupillary exams are normal and his intraocular pressure is around 20 bilaterally.  The patient's visual acuity remains normal.  He will be given erythromycin ointment, second troponin will be ordered as he still has a feeling of throat tightness, chest tightness, shortness of breath.  It has improved with the medications he was given.  He does have a headache as well.  Again I suspect this is likely allergy and he likely has a corneal abrasion secondary to rubbing his eyes.  Blood pressure rechecked after medications, has improved significantly, he will be given prescriptions for these  medications.  Prednisone will be prescribed  Erythromycin applied in the ER and prescribed for outpatient,  Also encouraged him to use an anti-inflammatory antihistamine drops such as ketotifen, he is in agreement.   Final Clinical Impressions(s) / ED Diagnoses   Final diagnoses:  Allergic conjunctivitis of both eyes  Allergic reaction, initial encounter  Essential hypertension  Shortness of breath  Abrasion of left cornea, initial encounter    ED Discharge Orders         Ordered    predniSONE (DELTASONE) 20 MG tablet  Daily     03/04/18 1129    hydrochlorothiazide (HYDRODIURIL) 25 MG tablet  Daily     03/04/18 1129    lisinopril (PRINIVIL,ZESTRIL) 20 MG tablet  Daily     03/04/18 1129           Noemi Chapel, MD 03/04/18 1132

## 2018-03-04 NOTE — ED Triage Notes (Signed)
Pt states he was in bed and began having right sided eye pain. The eye is swollen, pt also states his lips and tongue feel strange like they are swollen as well. Pt has HTN at triage, also endorses acute onset chest pain shortness of breath that started 2 hours ago. MD at bedside evaluating the pt. Only known allergen is Shellfish. MD aware of abnormal EKG.

## 2018-03-04 NOTE — ED Notes (Signed)
Patient states medication not working MD aware

## 2018-03-04 NOTE — ED Notes (Signed)
MD aware pain worst

## 2018-03-04 NOTE — Discharge Instructions (Addendum)
Please pick up a eyedrop called ketotifen (over the counter)  from the pharmacy.  This will help with itching burning and discomfort in the eyes.  Please use erythromycin ointment, 1/4 inch ribbon in each eye every 6 hours for the next 5 days,  Do not wear contact lenses or rub your eyes and 2 are totally better and cleared by ophthalmology.  I have referred you to an ophthalmologist, if you do not have an eye doctor call this number to be set up with outpatient follow-up.  The name of the eye doctor is Dr. Valetta Close.  Your blood pressure is severely elevated, it is likely from not taking your medications.  I have prescribed blood pressure medications which are very inexpensive and easy to take, please make sure that you take them exactly as prescribed.  You should also take prednisone once a day for the next 5 days to help with this allergic reaction.  It is not clear what caused this allergy but you should have this reevaluated at your doctor's office and if your symptoms are not improving he will need to be seen more urgently.  Return to the emergency department for severe or worsening symptoms.

## 2018-03-04 NOTE — ED Notes (Signed)
Patient requesting more medication.

## 2018-09-10 ENCOUNTER — Other Ambulatory Visit: Payer: Self-pay

## 2018-09-10 ENCOUNTER — Emergency Department (HOSPITAL_COMMUNITY)
Admission: EM | Admit: 2018-09-10 | Discharge: 2018-09-10 | Disposition: A | Discharge status: Home / Self Care | Payer: Self-pay | Attending: Emergency Medicine | Admitting: Emergency Medicine

## 2018-09-10 ENCOUNTER — Encounter (HOSPITAL_COMMUNITY): Payer: Self-pay

## 2018-09-10 DIAGNOSIS — F17200 Nicotine dependence, unspecified, uncomplicated: Secondary | ICD-10-CM | POA: Insufficient documentation

## 2018-09-10 DIAGNOSIS — Z79899 Other long term (current) drug therapy: Secondary | ICD-10-CM | POA: Insufficient documentation

## 2018-09-10 DIAGNOSIS — T7840XA Allergy, unspecified, initial encounter: Secondary | ICD-10-CM | POA: Insufficient documentation

## 2018-09-10 DIAGNOSIS — I10 Essential (primary) hypertension: Secondary | ICD-10-CM | POA: Insufficient documentation

## 2018-09-10 MED ORDER — FAMOTIDINE 20 MG PO TABS
20.0000 mg | ORAL_TABLET | Freq: Once | ORAL | Status: AC
  Administered 2018-09-10: 20 mg via ORAL
  Filled 2018-09-10: qty 1

## 2018-09-10 MED ORDER — SULFACETAMIDE SODIUM 10 % OP SOLN: 2.0000 [drp] | OPHTHALMIC | 0 refills | Status: DC

## 2018-09-10 MED ORDER — TRAMADOL HCL 50 MG PO TABS: 50.0000 mg | ORAL_TABLET | Freq: Four times a day (QID) | ORAL | 0 refills | Status: DC | PRN

## 2018-09-10 MED ORDER — KETOROLAC TROMETHAMINE 30 MG/ML IJ SOLN
30.0000 mg | Freq: Once | INTRAMUSCULAR | Status: AC
  Administered 2018-09-10: 30 mg via INTRAVENOUS
  Filled 2018-09-10: qty 1

## 2018-09-10 MED ORDER — METHYLPREDNISOLONE SODIUM SUCC 125 MG IJ SOLR
125.0000 mg | Freq: Once | INTRAMUSCULAR | Status: AC
  Administered 2018-09-10: 125 mg via INTRAVENOUS
  Filled 2018-09-10: qty 2

## 2018-09-10 MED ORDER — PREDNISONE 50 MG PO TABS: 50.0000 mg | ORAL_TABLET | Freq: Every day | ORAL | 0 refills | Status: DC

## 2018-09-10 MED ORDER — DIPHENHYDRAMINE HCL 50 MG/ML IJ SOLN
25.0000 mg | Freq: Once | INTRAMUSCULAR | Status: AC
  Administered 2018-09-10: 25 mg via INTRAVENOUS
  Filled 2018-09-10: qty 1

## 2018-09-10 MED ORDER — HYDROCODONE-ACETAMINOPHEN 5-325 MG PO TABS
1.0000 | ORAL_TABLET | Freq: Once | ORAL | Status: AC
  Administered 2018-09-10: 1 via ORAL
  Filled 2018-09-10: qty 1

## 2018-09-10 MED ORDER — FAMOTIDINE 20 MG PO TABS: 20.0000 mg | ORAL_TABLET | Freq: Two times a day (BID) | ORAL | 0 refills | Status: DC

## 2018-09-10 NOTE — ED Notes (Signed)
ED Provider at bedside. 

## 2018-09-10 NOTE — ED Notes (Signed)
Provider made aware of blood pressure at this time.  Pt to follow up with PCP

## 2018-09-10 NOTE — ED Triage Notes (Signed)
Pt presents with c/o redness and swelling in his right eye, possible allergic reaction. Pt reports he has been experiencing these symptoms since last night.

## 2018-09-10 NOTE — Discharge Instructions (Addendum)
Return here as needed.  Follow-up with a primary doctor.  Also continue taking Benadryl as well.

## 2018-09-14 NOTE — ED Provider Notes (Signed)
[name] DEPT Provider Note   CSN: [id number] Arrival date & time: 09/10/18  7939    History   Chief Complaint Chief Complaint  Patient presents with  . Allergic Reaction    HPI Richard Patterson is a 43 y.o. male.     HPI Patient presents to the emergency department with swelling around his right eye.  The patient states that his girlfriend has been using new cosmetics and also new lotions and she was rubbing against his face.  Patient states last night he noticed some mild swelling with and woke up this morning his right eye was swollen and felt like it was irritated.  Patient denies any trauma or other injuries.  The patient denies chest pain, shortness of breath, headache,blurred vision, neck pain, fever, cough, weakness, numbness, dizziness, anorexia, edema, abdominal pain, nausea, vomiting, diarrhea, rash, back pain, dysuria, hematemesis, bloody stool, near syncope, or syncope. Past Medical History:  Diagnosis Date  . Essential hypertension     Patient Active Problem List   Diagnosis Date Noted  . Hypertensive urgency 05/31/2016  . Chest pain 05/31/2016  . Essential hypertension 05/31/2016    Past Surgical History:  Procedure Laterality Date  . CHOLECYSTECTOMY    . HAND TENDON SURGERY Right         Home Medications    Prior to Admission medications   Medication Sig Start Date End Date Taking? Authorizing Provider  acetaminophen (TYLENOL) 325 MG tablet Take 650 mg by mouth every 6 (six) hours as needed (pain).   Yes [provider]  diphenhydrAMINE (BENADRYL) 25 mg capsule Take 1 capsule (25 mg total) by mouth every 6 (six) hours as needed. With  reglan for headache. 06/01/16  Yes Charlesetta Shanks, MD  hydrochlorothiazide (HYDRODIURIL) 25 MG tablet Take 1 tablet (25 mg total) by mouth daily. 03/04/18  Yes Noemi Chapel, MD  ibuprofen (ADVIL,MOTRIN) 200 MG tablet Take 200-400 mg by mouth every 6 (six) hours as needed for  moderate pain.   Yes [provider]  lisinopril (PRINIVIL,ZESTRIL) 20 MG tablet Take 1 tablet (20 mg total) by mouth daily. 03/04/18  Yes Noemi Chapel, MD  amLODipine (NORVASC) 10 MG tablet Take 1 tablet (10 mg total) by mouth daily. Patient not taking: Reported on 09/10/2018 06/07/16   Micheline Chapman, NP  famotidine (PEPCID) 20 MG tablet Take 1 tablet (20 mg total) by mouth 2 (two) times daily. 09/10/18   Emiya Loomer, Harrell Gave, PA-C  lisinopril-hydrochlorothiazide (PRINZIDE,ZESTORETIC) 20-25 MG tablet Take 1 tablet by mouth daily. Patient not taking: Reported on 09/10/2018 06/07/16   Micheline Chapman, NP  metoCLOPramide (REGLAN) 10 MG tablet Take 1 tablet (10 mg total) by mouth every 6 (six) hours. For headache, take benadryl with this medication. Patient not taking: Reported on 09/10/2018 06/01/16   Charlesetta Shanks, MD  predniSONE (DELTASONE) 50 MG tablet Take 1 tablet (50 mg total) by mouth daily. 09/10/18   Eli Adami, Harrell Gave, PA-C  sulfacetamide (BLEPH-10) 10 % ophthalmic solution Place 2 drops into the right eye every 4 (four) hours. 09/10/18   Saveah Bahar, Harrell Gave, PA-C  traMADol (ULTRAM) 50 MG tablet Take 1 tablet (50 mg total) by mouth every 6 (six) hours as needed for severe pain. 09/10/18   Dalia Heading, PA-C    Family History Family History  Problem Relation Age of Onset  . Hypertension Mother   . Hypertension Father   . Cancer Maternal Grandmother   . Cancer Maternal Grandfather     Social History Social History  Tobacco Use  . Smoking status: Current Every Day Smoker  . Smokeless tobacco: Never Used  Substance Use Topics  . Alcohol use: No  . Drug use: No     Allergies   Shellfish allergy   Review of Systems Review of Systems  All other systems negative except as documented in the HPI. All pertinent positives and negatives as reviewed in the HPI. Physical Exam Updated Vital Signs BP (!) 230/132   Pulse 89   Temp 98.4 F (36.9 C) (Oral)    Resp 18   Ht 6\' 1"  (1.854 m)   Wt 113.4 kg   SpO2 91%   BMI 32.98 kg/m   Physical Exam Vitals signs and nursing note reviewed.  Constitutional:      General: He is not in acute distress.    Appearance: He is well-developed.  HENT:     Head: Normocephalic and atraumatic.  Eyes:     General: Allergic shiner present.     Extraocular Movements: Extraocular movements intact.     Conjunctiva/sclera:     Right eye: Right conjunctiva is injected.     Pupils: Pupils are equal, round, and reactive to light.   Neck:     Musculoskeletal: Normal range of motion and neck supple.  Cardiovascular:     Rate and Rhythm: Normal rate and regular rhythm.     Heart sounds: Normal heart sounds. No murmur. No friction rub. No gallop.   Pulmonary:     Effort: Pulmonary effort is normal. No respiratory distress.     Breath sounds: Normal breath sounds. No wheezing.  Skin:    General: Skin is warm and dry.     Capillary Refill: Capillary refill takes less than 2 seconds.     Findings: No erythema or rash.  Neurological:     Mental Status: He is alert and oriented to person, place, and time.     Motor: No abnormal muscle tone.     Coordination: Coordination normal.  Psychiatric:        Behavior: Behavior normal.      ED Treatments / Results  Labs (all labs ordered are listed, but only abnormal results are displayed) Labs Reviewed - No data to display  EKG None  Radiology No results found.  Procedures Procedures (including critical care time)  Medications Ordered in ED Medications  diphenhydrAMINE (BENADRYL) injection 25 mg (25 mg Intravenous Given 09/10/18 0922)  methylPREDNISolone sodium succinate (SOLU-MEDROL) 125 mg/2 mL injection 125 mg (125 mg Intravenous Given 09/10/18 0923)  famotidine (PEPCID) tablet 20 mg (20 mg Oral Given 09/10/18 0916)  ketorolac (TORADOL) 30 MG/ML injection 30 mg (30 mg Intravenous Given 09/10/18 1254)  HYDROcodone-acetaminophen (NORCO/VICODIN) 5-325 MG per  tablet 1 tablet (1 tablet Oral Given 09/10/18 1532)     Initial Impression / Assessment and Plan / ED Course  I have reviewed the triage vital signs and the nursing notes.  Pertinent labs & imaging results that were available during my care of the patient were reviewed by me and considered in my medical decision making (see chart for details).        Patient has been observed over here for many hours.  His symptoms have improved while here in the emergency department.  Patient is advised to return here as needed.  Have advised him to follow-up with his primary doctor for his blood pressure.  Patient is made aware that is very high.  Patient states he is aware since he has not been taking his  medications.  Final Clinical Impressions(s) / ED Diagnoses   Final diagnoses:  Allergic reaction, initial encounter    ED Discharge Orders         Ordered    predniSONE (DELTASONE) 50 MG tablet  Daily     09/10/18 1453    famotidine (PEPCID) 20 MG tablet  2 times daily     09/10/18 1453    traMADol (ULTRAM) 50 MG tablet  Every 6 hours PRN     09/10/18 1453    sulfacetamide (BLEPH-10) 10 % ophthalmic solution  Every 4 hours     09/10/18 [place] Gave, PA-C 09/14/18 [place], [place], DO 09/14/18 2339

## 2019-01-16 ENCOUNTER — Ambulatory Visit (INDEPENDENT_AMBULATORY_CARE_PROVIDER_SITE_OTHER): Payer: Self-pay | Admitting: Family Medicine

## 2019-01-16 ENCOUNTER — Other Ambulatory Visit: Payer: Self-pay

## 2019-01-16 DIAGNOSIS — R208 Other disturbances of skin sensation: Secondary | ICD-10-CM

## 2019-01-16 DIAGNOSIS — R200 Anesthesia of skin: Secondary | ICD-10-CM

## 2019-01-16 DIAGNOSIS — M545 Low back pain, unspecified: Secondary | ICD-10-CM

## 2019-01-16 DIAGNOSIS — G8929 Other chronic pain: Secondary | ICD-10-CM

## 2019-01-16 DIAGNOSIS — I10 Essential (primary) hypertension: Secondary | ICD-10-CM

## 2019-01-16 DIAGNOSIS — E785 Hyperlipidemia, unspecified: Secondary | ICD-10-CM

## 2019-01-16 NOTE — Progress Notes (Signed)
Virtual Visit via Video Note  I connected with Mr Richard Patterson on 01/19/19 at  9:00 AM EDT by a video enabled telemedicine application and verified that I am speaking with the correct person using two identifiers.  Location patient: home Location provider:home office Persons participating in the virtual visit: patient, provider  I discussed the limitations of evaluation and management by telemedicine and the availability of in person appointments. The patient expressed understanding and agreed to proceed.   HPI: Mr Lafavor is a 43 yo male with Hx of HTN,HLD,GERD.OA,and obesity among some who is establishing care today. Former PCP: He has not had PCP.  HTN,he has hot taken medications in 2 weeks. He was on Amlodipine 10 mg and lisinopril-HCTZ  20-25 mg daily. 2 days of frontal mild,pressure headache,attributed to HTN. He does not check BP. Denies associated visual changes,chest pain,dyspnea,palpitations,abdominal pain,N/V,gross hematuria,decreased urine output,or edema.  Lab Results  Component Value Date   CREATININE 0.91 03/04/2018   BUN 11 03/04/2018   NA 141 03/04/2018   K 4.3 03/04/2018   CL 102 03/04/2018   CO2 26 03/04/2018   HLD: He is not on pharmacologic treatment. He has not been consistent with following a healthful diet and he does not exercise regularly.  Lab Results  Component Value Date   CHOL 154 06/07/2016   HDL 35 (L) 06/07/2016   LDLCALC 59 06/07/2016   TRIG 298 (H) 06/07/2016   CHOLHDL 4.4 06/07/2016   Today he is c/o right lower back pain radiated to right hip. Anterior thigh numbness and tingling, ntermittent for 2 years. Problem first started suddenly when carrying heavy furniture. Started again 2 days ago,  Dull pain, has been 10/10 but now 7-8/10. No recent trauma..  Denies saddle anesthesia,LE weakness, or bowel/urine incontinence.  He is taking Tylenol.  Negative for fever,chills,unusual fatigue,abnormal wt loss,local edema or  erythema. Progressively getting worse.   ROS: See pertinent positives and negatives per HPI.  Past Medical History:  Diagnosis Date  . Essential hypertension     Past Surgical History:  Procedure Laterality Date  . CHOLECYSTECTOMY    . HAND TENDON SURGERY Right     Family History  Problem Relation Age of Onset  . Hypertension Mother   . Hypertension Father   . Cancer Maternal Grandmother   . Cancer Maternal Grandfather     Social History   Socioeconomic History  . Marital status: Single    Spouse name: Not on file  . Number of children: Not on file  . Years of education: Not on file  . Highest education level: Not on file  Occupational History  . Not on file  Social Needs  . Financial resource strain: Not on file  . Food insecurity    Worry: Not on file    Inability: Not on file  . Transportation needs    Medical: Not on file    Non-medical: Not on file  Tobacco Use  . Smoking status: Current Every Day Smoker  . Smokeless tobacco: Never Used  Substance and Sexual Activity  . Alcohol use: No  . Drug use: No  . Sexual activity: Not on file  Lifestyle  . Physical activity    Days per week: Not on file    Minutes per session: Not on file  . Stress: Not on file  Relationships  . Social Herbalist on phone: Not on file    Gets together: Not on file    Attends religious service: Not on  file    Active member of club or organization: Not on file    Attends meetings of clubs or organizations: Not on file    Relationship status: Not on file  . Intimate partner violence    Fear of current or ex partner: Not on file    Emotionally abused: Not on file    Physically abused: Not on file    Forced sexual activity: Not on file  Other Topics Concern  . Not on file  Social History Narrative  . Not on file      Current Outpatient Medications:  .  acetaminophen (TYLENOL) 325 MG tablet, Take 650 mg by mouth every 6 (six) hours as needed (pain)., Disp: ,  Rfl:  .  amLODipine (NORVASC) 5 MG tablet, Take 1 tablet (5 mg total) by mouth daily., Disp: 90 tablet, Rfl: 0 .  diphenhydrAMINE (BENADRYL) 25 mg capsule, Take 1 capsule (25 mg total) by mouth every 6 (six) hours as needed. With  reglan for headache., Disp: 30 capsule, Rfl: 0 .  famotidine (PEPCID) 20 MG tablet, Take 1 tablet (20 mg total) by mouth 2 (two) times daily., Disp: 10 tablet, Rfl: 0 .  ibuprofen (ADVIL,MOTRIN) 200 MG tablet, Take 200-400 mg by mouth every 6 (six) hours as needed for moderate pain., Disp: , Rfl:  .  lisinopril-hydrochlorothiazide (ZESTORETIC) 20-25 MG tablet, Take 1 tablet by mouth daily., Disp: 90 tablet, Rfl: 1 .  sulfacetamide (BLEPH-10) 10 % ophthalmic solution, Place 2 drops into the right eye every 4 (four) hours., Disp: 15 mL, Rfl: 0  EXAM:  VITALS per patient if applicable:N/A  GENERAL: alert, oriented, appears well and in no acute distress  HEENT: atraumatic, conjunctiva clear, no obvious facial abnormalities on inspection.  NECK: normal movements of the head and neck  LUNGS: on inspection no signs of respiratory distress, breathing rate appears normal, no obvious gross SOB, gasping or wheezing  CV: no obvious cyanosis  MS: moves all visible extremities without noticeable abnormality  PSYCH/NEURO: pleasant and cooperative, no obvious depression or anxiety, speech and thought processing grossly intact  ASSESSMENT AND PLAN:  Discussed the following assessment and plan: Orders Placed This Encounter  Procedures  . MR Lumbar Spine Wo Contrast  . Comprehensive metabolic panel  . Lipid panel  . Hemoglobin A1c  . CBC    Essential hypertension - Plan: Comprehensive metabolic panel. Recommend monitoring BP regularly. Resume Amlodipine 5 mg and Lisinopril-HCTZ 20-25 mg. Low salt diet recommended. Instructed about warning signs.  Numbness of right anterior thigh - Plan: Hemoglobin A1c, CBC, MR Lumbar Spine Wo Contrast, ? Radiculopathy. Instructed  about warning signs. Further recommendations according to lumbar MRI.  Hyperlipidemia, unspecified hyperlipidemia type - Plan: Lipid panel. Low fat diet recommended. Further recommendations according to FLP results.  Chronic right-sided low back pain, unspecified whether sciatica present - Plan: Continue Tylenol 500 mg 3-4 times per day as needed. Local heat/ice. Lumbar MRI will be arranged.    I discussed the assessment and treatment plan with the patient. He was provided an opportunity to ask questions and all were answered. The patient agreed with the plan and demonstrated an understanding of the instructions.   The patient was advised to call back or seek an in-person evaluation if the symptoms worsen or if the condition fails to improve as anticipated.  Return in about 3 months (around 04/18/2019) for 3-4 months..    Ricky Doan Martinique, MD

## 2019-01-17 ENCOUNTER — Telehealth: Payer: Self-pay | Admitting: *Deleted

## 2019-01-17 MED ORDER — LISINOPRIL-HYDROCHLOROTHIAZIDE 20-25 MG PO TABS: 1.0000 | ORAL_TABLET | Freq: Every day | ORAL | 1 refills | Status: DC

## 2019-01-17 MED ORDER — AMLODIPINE BESYLATE 5 MG PO TABS: 5.0000 mg | ORAL_TABLET | Freq: Every day | ORAL | 0 refills | Status: DC

## 2019-01-17 NOTE — Telephone Encounter (Signed)
Message sent to Dr. Martinique for review.  Copied from [place] [phone]. Topic: Quick Communication - See Telephone Encounter >> Jan 17, 2019  9:27 AM Loma [place] wrote: CRM for notification. See Telephone encounter for: 01/17/19. Pt has called in because he had an appt yesterday with Martinique and the meds for his BP were not called in as of yet. Pls FU with pt  Richard Patterson [facility] [address], [place] [address] Phone: [phone] Fax: [phone]

## 2019-01-17 NOTE — Telephone Encounter (Signed)
Rx's were sent. Thanks, BJ

## 2019-01-19 ENCOUNTER — Encounter: Payer: Self-pay | Admitting: Family Medicine

## 2019-01-21 ENCOUNTER — Other Ambulatory Visit (INDEPENDENT_AMBULATORY_CARE_PROVIDER_SITE_OTHER): Payer: Self-pay

## 2019-01-21 DIAGNOSIS — E785 Hyperlipidemia, unspecified: Secondary | ICD-10-CM

## 2019-01-21 DIAGNOSIS — I10 Essential (primary) hypertension: Secondary | ICD-10-CM

## 2019-01-21 DIAGNOSIS — R200 Anesthesia of skin: Secondary | ICD-10-CM

## 2019-01-21 DIAGNOSIS — R208 Other disturbances of skin sensation: Secondary | ICD-10-CM

## 2019-01-21 LAB — COMPREHENSIVE METABOLIC PANEL
ALT: 64 U/L — ABNORMAL HIGH (ref 0–53)
AST: 42 U/L — ABNORMAL HIGH (ref 0–37)
Albumin: 4.4 g/dL (ref 3.5–5.2)
Alkaline Phosphatase: 69 U/L (ref 39–117)
BUN: 9 mg/dL (ref 6–23)
CO2: 25 mEq/L (ref 19–32)
Calcium: 9.2 mg/dL (ref 8.4–10.5)
Chloride: 103 mEq/L (ref 96–112)
Creatinine, Ser: 0.92 mg/dL (ref 0.40–1.50)
GFR: 108.1 mL/min (ref >60.00)
Glucose, Bld: 82 mg/dL (ref 70–99)
Potassium: 3.9 mEq/L (ref 3.5–5.1)
Sodium: 142 mEq/L (ref 135–145)
Total Bilirubin: 0.4 mg/dL (ref 0.2–1.2)
Total Protein: 7.2 g/dL (ref 6.0–8.3)

## 2019-01-21 LAB — CBC
HCT: 53 % — ABNORMAL HIGH (ref 39.0–52.0)
Hemoglobin: 18.5 g/dL (ref 13.0–17.0)
MCHC: 34.9 g/dL (ref 30.0–36.0)
MCV: 95.9 fl (ref 78.0–100.0)
Platelets: 180 10*3/uL (ref 150.0–400.0)
RBC: 5.52 Mil/uL (ref 4.22–5.81)
RDW: 13.8 % (ref 11.5–15.5)
WBC: 7.8 10*3/uL (ref 4.0–10.5)

## 2019-01-21 LAB — LIPID PANEL
Cholesterol: 143 mg/dL (ref 0–200)
HDL: 44.2 mg/dL (ref >39.00)
LDL Cholesterol: 66 mg/dL (ref 0–99)
NonHDL: 98.44
Total CHOL/HDL Ratio: 3
Triglycerides: 162 mg/dL — ABNORMAL HIGH (ref 0.0–149.0)
VLDL: 32.4 mg/dL (ref 0.0–40.0)

## 2019-01-21 LAB — HEMOGLOBIN A1C: Hgb A1c MFr Bld: 5.1 % (ref 4.6–6.5)

## 2019-01-22 ENCOUNTER — Encounter: Payer: Self-pay | Admitting: Family Medicine

## 2019-01-22 DIAGNOSIS — D751 Secondary polycythemia: Secondary | ICD-10-CM | POA: Insufficient documentation

## 2019-02-03 NOTE — Progress Notes (Deleted)
[name] is a 43 y.o.male, who is here today to follow on HTN. Last follow up visit: 01/16/19.  Currently on Lisinopril-HCTZ 20-25 mg daily and Amlodipine 5 mg daily. ***taking medications as instructed, no side effects reported.  ***has not noted unusual headache, visual changes, exertional chest pain, dyspnea,  focal weakness, or edema. Home BP readings: ***   Lab Results  Component Value Date   CREATININE 0.92 01/21/2019   BUN 9 01/21/2019   NA 142 01/21/2019   K 3.9 01/21/2019   CL 103 01/21/2019   CO2 25 01/21/2019     Review of Systems Rest see pertinent positives and negatives per HPI.  Current Outpatient Medications on File Prior to Visit  Medication Sig Dispense Refill  . acetaminophen (TYLENOL) 325 MG tablet Take 650 mg by mouth every 6 (six) hours as needed (pain).    Marland Kitchen amLODipine (NORVASC) 5 MG tablet Take 1 tablet (5 mg total) by mouth daily. 90 tablet 0  . diphenhydrAMINE (BENADRYL) 25 mg capsule Take 1 capsule (25 mg total) by mouth every 6 (six) hours as needed. With  reglan for headache. 30 capsule 0  . famotidine (PEPCID) 20 MG tablet Take 1 tablet (20 mg total) by mouth 2 (two) times daily. 10 tablet 0  . ibuprofen (ADVIL,MOTRIN) 200 MG tablet Take 200-400 mg by mouth every 6 (six) hours as needed for moderate pain.    Marland Kitchen lisinopril-hydrochlorothiazide (ZESTORETIC) 20-25 MG tablet Take 1 tablet by mouth daily. 90 tablet 1  . sulfacetamide (BLEPH-10) 10 % ophthalmic solution Place 2 drops into the right eye every 4 (four) hours. 15 mL 0   No current facility-administered medications on file prior to visit.      Past Medical History:  Diagnosis Date  . Essential hypertension     Allergies  Allergen Reactions  . Shellfish Allergy Hives and Rash    Social History   Socioeconomic History  . Marital status: Single    Spouse name: Not on file  . Number of children: Not on file  . Years of education: Not on file  . Highest education  level: Not on file  Occupational History  . Not on file  Social Needs  . Financial resource strain: Not on file  . Food insecurity    Worry: Not on file    Inability: Not on file  . Transportation needs    Medical: Not on file    Non-medical: Not on file  Tobacco Use  . Smoking status: Current Every Day Smoker  . Smokeless tobacco: Never Used  Substance and Sexual Activity  . Alcohol use: No  . Drug use: No  . Sexual activity: Not on file  Lifestyle  . Physical activity    Days per week: Not on file    Minutes per session: Not on file  . Stress: Not on file  Relationships  . Social Herbalist on phone: Not on file    Gets together: Not on file    Attends religious service: Not on file    Active member of club or organization: Not on file    Attends meetings of clubs or organizations: Not on file    Relationship status: Not on file  Other Topics Concern  . Not on file  Social History Narrative  . Not on file    There were no vitals filed for this visit. There is no height or weight on file to calculate BMI.  Physical Exam  ASSESSMENT AND PLAN:   There are no diagnoses linked to this encounter. [name] is here today for HTN follow up.    No problem-specific Assessment & Plan notes found for this encounter.    -[name] advised to return sooner than planned today if new concerns arise.     Richard Patterson G. Martinique, MD  [facility] [place] office.

## 2019-02-04 ENCOUNTER — Ambulatory Visit: Payer: Self-pay | Admitting: Family Medicine

## 2019-02-04 DIAGNOSIS — Z0289 Encounter for other administrative examinations: Secondary | ICD-10-CM

## 2019-02-14 ENCOUNTER — Other Ambulatory Visit: Payer: Self-pay | Admitting: Family Medicine

## 2019-02-18 ENCOUNTER — Other Ambulatory Visit: Payer: Self-pay

## 2019-02-18 ENCOUNTER — Ambulatory Visit (INDEPENDENT_AMBULATORY_CARE_PROVIDER_SITE_OTHER): Payer: Self-pay | Admitting: Family Medicine

## 2019-02-18 ENCOUNTER — Ambulatory Visit: Payer: Self-pay | Admitting: Family Medicine

## 2019-02-18 ENCOUNTER — Encounter: Payer: Self-pay | Admitting: Family Medicine

## 2019-02-18 DIAGNOSIS — I10 Essential (primary) hypertension: Secondary | ICD-10-CM

## 2019-02-18 NOTE — Progress Notes (Signed)
Virtual Visit via Video Note  I connected with Richard Patterson on 02/18/19 at  3:30 PM EDT by a video enabled telemedicine application and verified that I am speaking with the correct person using two identifiers.  Location patient: home Location provider:work or home office Persons participating in the virtual visit: patient, provider  I discussed the limitations of evaluation and management by telemedicine and the availability of in person appointments. The patient expressed understanding and agreed to proceed.   HPI: Pt with elevated bp at dentist office this am, 222/141, then 197/140.  Initially wrist cuff used then a standard arm cuff.  Pt did not take his meds this am as he was rushing.  Pt states he has to reschedule his dental visit until his bp has improved and he gets a note from his doctor.  Pt denies HAs, changes in vision, CP.  Pt notes his insurance would not cover a 90 d supply of norvasc 5 mg and lisinopril-hctz 20-25 mg.  He was given a 30 d supply with 1.5 refills.  Pt does not have a bp cuff at home.  Endorses eating fast food 4-5x/wk.  Pt has family hx of HTN.   ROS: See pertinent positives and negatives per HPI.  Past Medical History:  Diagnosis Date  . Essential hypertension     Past Surgical History:  Procedure Laterality Date  . CHOLECYSTECTOMY    . HAND TENDON SURGERY Right     Family History  Problem Relation Age of Onset  . Hypertension Mother   . Hypertension Father   . Cancer Maternal Grandmother   . Cancer Maternal Grandfather     Current Outpatient Medications:  .  acetaminophen (TYLENOL) 325 MG tablet, Take 650 mg by mouth every 6 (six) hours as needed (pain)., Disp: , Rfl:  .  amLODipine (NORVASC) 5 MG tablet, Take 1 tablet (5 mg total) by mouth daily., Disp: 90 tablet, Rfl: 0 .  diphenhydrAMINE (BENADRYL) 25 mg capsule, Take 1 capsule (25 mg total) by mouth every 6 (six) hours as needed. With  reglan for headache., Disp: 30 capsule, Rfl: 0 .   famotidine (PEPCID) 20 MG tablet, Take 1 tablet (20 mg total) by mouth 2 (two) times daily., Disp: 10 tablet, Rfl: 0 .  ibuprofen (ADVIL,MOTRIN) 200 MG tablet, Take 200-400 mg by mouth every 6 (six) hours as needed for moderate pain., Disp: , Rfl:  .  lisinopril-hydrochlorothiazide (ZESTORETIC) 20-25 MG tablet, Take 1 tablet by mouth daily., Disp: 90 tablet, Rfl: 1 .  sulfacetamide (BLEPH-10) 10 % ophthalmic solution, Place 2 drops into the right eye every 4 (four) hours., Disp: 15 mL, Rfl: 0  EXAM:  VITALS per patient if applicable: RR between 16-10 bpm.  GENERAL: alert, oriented, appears well and in no acute distress  HEENT: atraumatic, conjunctiva clear, no obvious abnormalities on inspection of external nose and ears  NECK: normal movements of the head and neck  LUNGS: on inspection no signs of respiratory distress, breathing rate appears normal, no obvious gross SOB, gasping or wheezing  CV: no obvious cyanosis  MS: moves all visible extremities without noticeable abnormality  PSYCH/NEURO: pleasant and cooperative, no obvious depression or anxiety, speech and thought processing grossly intact  ASSESSMENT AND PLAN:  Discussed the following assessment and plan:  Essential hypertension  -uncontrolled -continue current meds norvasc 5 mg and lisinopril-HCTZ 20-25 mg daily -pt to obtain bp cuff for home monitoring and keep a log of his bp -given precautions -f/u in 1-2 wks with  pcp   I discussed the assessment and treatment plan with the patient. The patient was provided an opportunity to ask questions and all were answered. The patient agreed with the plan and demonstrated an understanding of the instructions.   The patient was advised to call back or seek an in-person evaluation if the symptoms worsen or if the condition fails to improve as anticipated.   Billie Ruddy, MD

## 2019-02-18 NOTE — Telephone Encounter (Signed)
Pt did not take his BP medication this morning and he went to the dentist. At the dentist office his BP was 222/141 and repeated it and it was 197/140. Pt stated he is not having any symptoms. Pt stated after he got home he took both of his BP medications. Pt does not have a BP monitor at home. Advised pt get a BP monitor ASAP.Discussed care advice and pt verbalized understanding. Pt call transferred to to [facility] at [place].  Reason for Disposition . [4] Systolic BP  >= 142 OR Diastolic >= 395  AND [3] having NO cardiac or neurologic symptoms  Answer Assessment - Initial Assessment Questions 1. BLOOD PRESSURE: "What is the blood pressure?" "Did you take at least two measurements 5 minutes apart?"     222/141  197/140 2. ONSET: "When did you take your blood pressure?"    1020 this am  3. HOW: "How did you obtain the blood pressure?" (e.g., visiting nurse, automatic home BP monitor)     At the dentist office 4. HISTORY: "Do you have a history of high blood pressure?"     yes 5. MEDICATIONS: "Are you taking any medications for blood pressure?" "Have you missed any doses recently?"     Yes missed doses this am 6. OTHER SYMPTOMS: "Do you have any symptoms?" (e.g., headache, chest pain, blurred vision, difficulty breathing, weakness)     no 7. PREGNANCY: "Is there any chance you are pregnant?" "When was your last menstrual period?"     n/a  Protocols used: HIGH BLOOD PRESSURE-A-AH

## 2019-03-18 ENCOUNTER — Inpatient Hospital Stay: Admission: RE | Admit: 2019-03-18 | Payer: Self-pay | Source: Ambulatory Visit

## 2019-05-22 ENCOUNTER — Other Ambulatory Visit: Payer: Self-pay

## 2019-05-22 ENCOUNTER — Telehealth (INDEPENDENT_AMBULATORY_CARE_PROVIDER_SITE_OTHER): Payer: Self-pay | Admitting: Adult Health

## 2019-05-22 DIAGNOSIS — L239 Allergic contact dermatitis, unspecified cause: Secondary | ICD-10-CM

## 2019-05-22 MED ORDER — PREDNISONE 10 MG PO TABS: ORAL_TABLET | ORAL | 0 refills | Status: DC

## 2019-05-22 NOTE — Progress Notes (Signed)
Virtual Visit via Video Note  I connected with Richard Patterson  on 05/22/19 at  4:00 PM EDT by a video enabled telemedicine application and verified that I am speaking with the correct person using two identifiers.  Location patient: home Location provider:work or home office Persons participating in the virtual visit: patient, provider  I discussed the limitations of evaluation and management by telemedicine and the availability of in person appointments. The patient expressed understanding and agreed to proceed.   HPI: 43 year old male who is being evaluated today for an acute issue.  His symptoms started 3 days ago and have slowly progressed.  His symptoms include small  Red "bumps" on bilateral arms, hands, and legs.  This rash does not itch, is not painful, and there is no drainage.  He denies fevers, chills, sore throat, feeling acutely ill.  He does not have any small children at home.  Other family members that live with him have not experienced same symptoms.  He did change laundry detergents 1 day prior to the rash being present, but denies rash on torso or elsewhere on his body.   ROS: See pertinent positives and negatives per HPI.  Past Medical History:  Diagnosis Date  . Essential hypertension     Past Surgical History:  Procedure Laterality Date  . CHOLECYSTECTOMY    . HAND TENDON SURGERY Right     Family History  Problem Relation Age of Onset  . Hypertension Mother   . Hypertension Father   . Cancer Maternal Grandmother   . Cancer Maternal Grandfather      Current Outpatient Medications:  .  acetaminophen (TYLENOL) 325 MG tablet, Take 650 mg by mouth every 6 (six) hours as needed (pain)., Disp: , Rfl:  .  amLODipine (NORVASC) 5 MG tablet, Take 1 tablet (5 mg total) by mouth daily., Disp: 90 tablet, Rfl: 0 .  diphenhydrAMINE (BENADRYL) 25 mg capsule, Take 1 capsule (25 mg total) by mouth every 6 (six) hours as needed. With  reglan for headache., Disp: 30 capsule,  Rfl: 0 .  famotidine (PEPCID) 20 MG tablet, Take 1 tablet (20 mg total) by mouth 2 (two) times daily., Disp: 10 tablet, Rfl: 0 .  ibuprofen (ADVIL,MOTRIN) 200 MG tablet, Take 200-400 mg by mouth every 6 (six) hours as needed for moderate pain., Disp: , Rfl:  .  lisinopril-hydrochlorothiazide (ZESTORETIC) 20-25 MG tablet, Take 1 tablet by mouth daily., Disp: 90 tablet, Rfl: 1 .  sulfacetamide (BLEPH-10) 10 % ophthalmic solution, Place 2 drops into the right eye every 4 (four) hours., Disp: 15 mL, Rfl: 0  EXAM:  VITALS per patient if applicable:  GENERAL: alert, oriented, appears well and in no acute distress  HEENT: atraumatic, conjunttiva clear, no obvious abnormalities on inspection of external nose and ears  NECK: normal movements of the head and neck  LUNGS: on inspection no signs of respiratory distress, breathing rate appears normal, no obvious gross SOB, gasping or wheezing  CV: no obvious cyanosis  MS: moves all visible extremities without noticeable abnormality  PSYCH/NEURO: pleasant and cooperative, no obvious depression or anxiety, speech and thought processing grossly intact  SKIN: Difficult to tell on video conference, but did appear to have small raised rash on bilateral arms.  ASSESSMENT AND PLAN:  Discussed the following assessment and plan:  1. Allergic contact dermatitis, unspecified trigger -Treat as contact dermatitis.  As mentioned above exam was difficult due to video quality.  He was advised to follow-up if no improvement in the next  2 days.  Doubt hand mouth or foot disease. - predniSONE (DELTASONE) 10 MG tablet; 40 mg x 3 days, 20 mg x 3 days, 10 mg x 3 days  Dispense: 21 tablet; Refill: 0   I discussed the assessment and treatment plan with the patient. The patient was provided an opportunity to ask questions and all were answered. The patient agreed with the plan and demonstrated an understanding of the instructions.   The patient was advised to call  back or seek an in-person evaluation if the symptoms worsen or if the condition fails to improve as anticipated.   Dorothyann Peng, NP

## 2019-06-17 ENCOUNTER — Other Ambulatory Visit: Payer: Self-pay

## 2019-06-17 ENCOUNTER — Telehealth (INDEPENDENT_AMBULATORY_CARE_PROVIDER_SITE_OTHER): Payer: Self-pay | Admitting: Family Medicine

## 2019-06-17 ENCOUNTER — Encounter: Payer: Self-pay | Admitting: Family Medicine

## 2019-06-17 VITALS — Ht 73.0 in

## 2019-06-17 DIAGNOSIS — N529 Male erectile dysfunction, unspecified: Secondary | ICD-10-CM

## 2019-06-17 DIAGNOSIS — L298 Other pruritus: Secondary | ICD-10-CM

## 2019-06-17 DIAGNOSIS — I10 Essential (primary) hypertension: Secondary | ICD-10-CM

## 2019-06-17 DIAGNOSIS — R7401 Elevation of levels of liver transaminase levels: Secondary | ICD-10-CM

## 2019-06-17 DIAGNOSIS — L2989 Other pruritus: Secondary | ICD-10-CM

## 2019-06-17 MED ORDER — SILDENAFIL CITRATE 20 MG PO TABS: 20.0000 mg | ORAL_TABLET | Freq: Every day | ORAL | 1 refills | Status: DC | PRN

## 2019-06-17 MED ORDER — PERMETHRIN 5 % EX CREA: TOPICAL_CREAM | CUTANEOUS | 0 refills | Status: AC

## 2019-06-17 MED ORDER — TRIAMCINOLONE ACETONIDE 0.1 % EX CREA: 1.0000 "application " | TOPICAL_CREAM | Freq: Two times a day (BID) | CUTANEOUS | 0 refills | Status: AC

## 2019-06-17 MED ORDER — AMLODIPINE BESY-BENAZEPRIL HCL 5-20 MG PO CAPS: 1.0000 | ORAL_CAPSULE | Freq: Every day | ORAL | 2 refills | Status: DC

## 2019-06-17 NOTE — Progress Notes (Signed)
Virtual Visit via Video Note   I connected with Richard Patterson on 06/17/19 by a video enabled telemedicine application and verified that I am speaking with the correct person using two identifiers.  Location patient: home Location provider:work or home office Persons participating in the virtual visit: patient, provider  I discussed the limitations of evaluation and management by telemedicine and the availability of in person appointments. The patient expressed understanding and agreed to proceed.   HPI: Richard Patterson is a 43 yo male with Hx of HTN and obesity c/o persistent rash on upper and lower extremities. He has had problem for about 3-4 weeks.  Negative for any new medication or body product. It seems like he was on a new laundry detergent a day before rash started, he is back to his usual. No known insect bite or outdoor exposures to plants. No sick contact. No Hx of eczema or similar rash in the past.  He was evaluated for same problem on 05/22/19, Prednisone taper was started. Problem improved while he was on Prednisone,not resolved,and worse once he completed treatment. Rash is pruritic, it does not interfere with sleep.  He has not noted rash on abdomen,hands,or inguinal area.  He denies oral lesions/edema,cough, wheezing, dyspnea, abdominal pain, nausea, or vomiting.  Also c/o having difficulty with maintaining an erection. He is now in a relationship, he has not been sexually active for about 5 years. No known Hx. He wonders if problem is caused by antihypertensive medication. Denies decreased libido,depression,nipple discharge,or fatigue. He still has morning erections.  HTN, he is on Lisinopril-HCTZ 20-25 mg daily. BP's 150-160's/?. Denies severe/frequent headache, visual changes, chest pain,palpitation, claudication, focal weakness, or edema. + Smoker. Abnormal LFT's. Denies alcohol abuse, he drinks 3-4 beers during weekends.  Lab Results  Component Value Date   ALT  64 (H) 01/21/2019   AST 42 (H) 01/21/2019   ALKPHOS 69 01/21/2019   BILITOT 0.4 01/21/2019    ROS: See pertinent positives and negatives per HPI.  Past Medical History:  Diagnosis Date  . Essential hypertension     Past Surgical History:  Procedure Laterality Date  . CHOLECYSTECTOMY    . HAND TENDON SURGERY Right     Family History  Problem Relation Age of Onset  . Hypertension Mother   . Hypertension Father   . Cancer Maternal Grandmother   . Cancer Maternal Grandfather     Social History   Socioeconomic History  . Marital status: Single    Spouse name: Not on file  . Number of children: Not on file  . Years of education: Not on file  . Highest education level: Not on file  Occupational History  . Not on file  Social Needs  . Financial resource strain: Not on file  . Food insecurity    Worry: Not on file    Inability: Not on file  . Transportation needs    Medical: Not on file    Non-medical: Not on file  Tobacco Use  . Smoking status: Current Every Day Smoker  . Smokeless tobacco: Never Used  Substance and Sexual Activity  . Alcohol use: No  . Drug use: No  . Sexual activity: Not on file  Lifestyle  . Physical activity    Days per week: Not on file    Minutes per session: Not on file  . Stress: Not on file  Relationships  . Social Herbalist on phone: Not on file    Gets together: Not on  file    Attends religious service: Not on file    Active member of club or organization: Not on file    Attends meetings of clubs or organizations: Not on file    Relationship status: Not on file  . Intimate partner violence    Fear of current or ex partner: Not on file    Emotionally abused: Not on file    Physically abused: Not on file    Forced sexual activity: Not on file  Other Topics Concern  . Not on file  Social History Narrative  . Not on file      Current Outpatient Medications:  .  acetaminophen (TYLENOL) 325 MG tablet, Take 650 mg  by mouth every 6 (six) hours as needed (pain)., Disp: , Rfl:  .  amLODipine (NORVASC) 5 MG tablet, Take 1 tablet (5 mg total) by mouth daily., Disp: 90 tablet, Rfl: 0 .  amLODipine-benazepril (LOTREL) 5-20 MG capsule, Take 1 capsule by mouth daily., Disp: 30 capsule, Rfl: 2 .  diphenhydrAMINE (BENADRYL) 25 mg capsule, Take 1 capsule (25 mg total) by mouth every 6 (six) hours as needed. With  reglan for headache., Disp: 30 capsule, Rfl: 0 .  famotidine (PEPCID) 20 MG tablet, Take 1 tablet (20 mg total) by mouth 2 (two) times daily., Disp: 10 tablet, Rfl: 0 .  ibuprofen (ADVIL,MOTRIN) 200 MG tablet, Take 200-400 mg by mouth every 6 (six) hours as needed for moderate pain., Disp: , Rfl:  .  permethrin (ELIMITE) 5 % cream, Apply from the neck down at bedtime and rinse after 8 hours.  May repeat in 14 days if symptoms persist., Disp: 60 g, Rfl: 0 .  predniSONE (DELTASONE) 10 MG tablet, 40 mg x 3 days, 20 mg x 3 days, 10 mg x 3 days, Disp: 21 tablet, Rfl: 0 .  sildenafil (REVATIO) 20 MG tablet, Take 1-2 tablets (20-40 mg total) by mouth daily as needed., Disp: 60 tablet, Rfl: 1 .  sulfacetamide (BLEPH-10) 10 % ophthalmic solution, Place 2 drops into the right eye every 4 (four) hours., Disp: 15 mL, Rfl: 0 .  triamcinolone cream (KENALOG) 0.1 %, Apply 1 application topically 2 (two) times daily for 14 days., Disp: 45 g, Rfl: 0  EXAM:  VITALS per patient if applicable:Ht 6\' 1"  (1.854 m)   BMI 32.98 kg/m   GENERAL: alert, oriented, appears well and in no acute distress  HEENT: atraumatic, conjunctiva clear, no obvious abnormalities on inspection.  NECK: normal movements of the head and neck  LUNGS: on inspection no signs of respiratory distress, breathing rate appears normal, no obvious gross SOB, gasping or wheezing  CV: no obvious cyanosis  MS: moves all visible extremities without noticeable abnormality  SKIN: Papular erythematous lesions on LLE, below knee and forearms R>L. See  pictures.  PSYCH/NEURO: pleasant and cooperative, no obvious depression or anxiety, speech and thought processing grossly intact       ASSESSMENT AND PLAN:  Discussed the following assessment and plan:  Pruritic erythematous rash - Plan: triamcinolone cream (KENALOG) 0.1 %, Ambulatory referral to Dermatology, permethrin (ELIMITE) 5 % cream We discussed possible etiologies, temporal relief with oral Prednisone. ? Scabies. Recommend topical Permethrin and recommend treating clothes,leaning,and blankets with hot water.  Erectile dysfunction, unspecified erectile dysfunction type - Plan: sildenafil (REVATIO) 20 MG tablet, TSH, Testosterone Possible etiologies discussed as well as treatment options. Lab appt will be arranged. Sildenafil side effects discussed.  Essential hypertension - Plan: amLODipine-benazepril (LOTREL) 5-20 MG capsule Still not  well controlled. Possible complications of elevated BP discussed. HCTZ discontinued, could be causing/contributing to ED. Lisinopril stopped. Benazepril started and no changes in Amlodipine. Recommend Lotrel 5-20 mg daily at bedtime.  Elevated transaminase level - Plan: CMP Recommend decreasing alcohol intake. Wt loss will also help.  Further recommendations according with lab results.   I discussed the assessment and treatment plan with the patient. He was provided an opportunity to ask questions and all were answered. He agreed with the plan and demonstrated an understanding of the instructions.   The patient was advised to call back or seek an in-person evaluation if the symptoms worsen or if the condition fails to improve as anticipated.  Return in about 3 months (around 09/17/2019) for 3 motnhs f/u and labs on 06/27/19 between 8 am and 10 am. .    Richard Trombetta Martinique, MD

## 2019-08-30 NOTE — Progress Notes (Signed)
Patient is scheduled for 09/24/2019 at 10:30

## 2019-09-23 ENCOUNTER — Other Ambulatory Visit: Payer: Self-pay

## 2019-09-24 ENCOUNTER — Ambulatory Visit: Payer: Self-pay | Admitting: Family Medicine

## 2019-09-24 DIAGNOSIS — Z0289 Encounter for other administrative examinations: Secondary | ICD-10-CM

## 2019-10-15 ENCOUNTER — Other Ambulatory Visit: Payer: Self-pay

## 2019-10-15 ENCOUNTER — Encounter (HOSPITAL_COMMUNITY): Payer: Self-pay | Admitting: Emergency Medicine

## 2019-10-15 ENCOUNTER — Ambulatory Visit (HOSPITAL_COMMUNITY)
Admission: EM | Admit: 2019-10-15 | Discharge: 2019-10-15 | Disposition: A | Discharge status: Home / Self Care | Payer: No Typology Code available for payment source | Attending: Family Medicine | Admitting: Family Medicine

## 2019-10-15 DIAGNOSIS — I10 Essential (primary) hypertension: Secondary | ICD-10-CM

## 2019-10-15 DIAGNOSIS — L02214 Cutaneous abscess of groin: Secondary | ICD-10-CM | POA: Diagnosis not present

## 2019-10-15 HISTORY — DX: Essential (primary) hypertension: I10

## 2019-10-15 MED ORDER — HYDROCODONE-ACETAMINOPHEN 5-325 MG PO TABS
1.0000 | ORAL_TABLET | Freq: Once | ORAL | Status: AC
  Administered 2019-10-15: 1 via ORAL

## 2019-10-15 MED ORDER — DOXYCYCLINE HYCLATE 100 MG PO CAPS: 100.0000 mg | ORAL_CAPSULE | Freq: Two times a day (BID) | ORAL | 0 refills | Status: DC

## 2019-10-15 MED ORDER — HYDROCODONE-ACETAMINOPHEN 5-325 MG PO TABS: 1.0000 | ORAL_TABLET | Freq: Four times a day (QID) | ORAL | 0 refills | Status: AC | PRN

## 2019-10-15 MED ORDER — HYDROCODONE-ACETAMINOPHEN 5-325 MG PO TABS
ORAL_TABLET | ORAL | Status: AC
  Filled 2019-10-15: qty 1

## 2019-10-15 NOTE — Discharge Instructions (Addendum)
Be aware, you have been prescribed pain medications that may cause drowsiness. While taking this medication, do not take any other medications containing acetaminophen (Tylenol). Do not combine with alcohol or other illicit drugs. Please do not drive, operate heavy machinery, or take part in activities that require making important decisions while on this medication as your judgement may be clouded.  Your blood pressure was noted to be elevated during your visit today. We will recheck on your follow up here in two days. If your blood pressure remains persistently elevated, you may need to begin taking a medication.  BP (!) 199/121 (BP Location: Left Arm)    Pulse 86    Temp 98.6 F (37 C) (Oral)    Resp 16    SpO2 99%

## 2019-10-15 NOTE — ED Triage Notes (Signed)
Patient reports noticing a bump on inner left thigh on Sunday.  Since then bump has grown and is very painful

## 2019-10-16 NOTE — ED Provider Notes (Signed)
[name]   [id number] 10/15/19 Arrival Time: [id number]  ASSESSMENT & PLAN:  1. Abscess of groin, left   2. Uncontrolled hypertension     Incision and Drainage Procedure Note  Anesthesia: 2% plain lidocaine  Procedure Details  The procedure, risks and complications have been discussed in detail (including, but not limited to pain and bleeding) with the patient.  The skin induration was prepped and draped in the usual fashion. After adequate local anesthesia, I&D with a #11 blade was performed on the left perineal region just inferior to scrotum with copious, purulent drainage.  EBL: minimal Drains: none Packing: placed Condition: Tolerated procedure well Complications: none.   Meds ordered this encounter  Medications  . HYDROcodone-acetaminophen (NORCO/VICODIN) 5-325 MG per tablet 1 tablet  . doxycycline (VIBRAMYCIN) 100 MG capsule    Sig: Take 1 capsule (100 mg total) by mouth 2 (two) times daily.    Dispense:  20 capsule    Refill:  0  . HYDROcodone-acetaminophen (NORCO/VICODIN) 5-325 MG tablet    Sig: Take 1 tablet by mouth every 6 (six) hours as needed for moderate pain or severe pain.    Dispense:  8 tablet    Refill:  0    Wound care instructions discussed and given in written format. To return in 48 hours for wound check.  Finish all antibiotics. OTC analgesics as needed.  [place] Controlled Substances Registry consulted for this patient. I feel the risk/benefit ratio today is favorable for proceeding with this prescription for a controlled substance. Medication sedation precautions given.    Discharge Instructions     Be aware, you have been prescribed pain medications that may cause drowsiness. While taking this medication, do not take any other medications containing acetaminophen (Tylenol). Do not combine with alcohol or other illicit drugs. Please do not drive, operate heavy machinery, or take part in activities that require making important decisions  while on this medication as your judgement may be clouded.  Your blood pressure was noted to be elevated during your visit today. We will recheck on your follow up here in two days. If your blood pressure remains persistently elevated, you may need to begin taking a medication.  BP (!) 199/121 (BP Location: Left Arm)   Pulse 86   Temp 98.6 F (37 C) (Oral)   Resp 16   SpO2 99%       No s/s of hypertensive urgency.  Reviewed expectations re: course of current medical issues. Questions answered. Outlined signs and symptoms indicating need for more acute intervention. Patient verbalized understanding. After Visit Summary given.   SUBJECTIVE:  Richard Patterson is a 44 y.o. male who presents with a possible infection of his L groin. Onset gradual, approximately approx 3 d ago without active drainage and without active bleeding. Symptoms have worsened since beginning; increased swelling and pain. Fever: absent. OTC/home treatment: none.  Increased blood pressure noted today. Previous history of HTN. No current treatment.  He reports no chest pain on exertion, no dyspnea on exertion, no swelling of ankles, no orthostatic dizziness or lightheadedness, no orthopnea or paroxysmal nocturnal dyspnea and no palpitations.   ROS: As per HPI.  OBJECTIVE:  Vitals:   10/15/19 1813  BP: (!) 199/121  Pulse: 86  Resp: 16  Temp: 98.6 F (37 C)  TempSrc: Oral  SpO2: 99%    Elevated BP noted and discussed with him. Reports that he is in much pain.  General appearance: alert; no distress Skin: approx 2x3 cm induration of  his L perineal region just inferior to scrotum; very tender to touch; no active drainage or bleeding Psychological: alert and cooperative; normal mood and affect  No Known Allergies  Past Medical History:  Diagnosis Date  . Hypertension    Social History   Socioeconomic History  . Marital status: Single    Spouse name: Not on file  . Number of children: Not on file   . Years of education: Not on file  . Highest education level: Not on file  Occupational History  . Not on file  Tobacco Use  . Smoking status: Current Every Day Smoker  Substance and Sexual Activity  . Alcohol use: Yes  . Drug use: Never  . Sexual activity: Not on file  Other Topics Concern  . Not on file  Social History Narrative  . Not on file   Social Determinants of Health   Financial Resource Strain:   . Difficulty of Paying Living Expenses:   Food Insecurity:   . Worried About Charity fundraiser in the Last Year:   . Arboriculturist in the Last Year:   Transportation Needs:   . Film/video editor (Medical):   Marland Kitchen Lack of Transportation (Non-Medical):   Physical Activity:   . Days of Exercise per Week:   . Minutes of Exercise per Session:   Stress:   . Feeling of Stress :   Social Connections:   . Frequency of Communication with Friends and Family:   . Frequency of Social Gatherings with Friends and Family:   . Attends Religious Services:   . Active Member of Clubs or Organizations:   . Attends Archivist Meetings:   Marland Kitchen Marital Status:    Family History  Problem Relation Age of Onset  . Hypertension Mother   . Hypertension Father    History reviewed. No pertinent surgical history.         Vanessa Kick, MD 10/16/19 [phone]

## 2019-12-10 ENCOUNTER — Other Ambulatory Visit: Payer: Self-pay

## 2019-12-11 ENCOUNTER — Encounter: Payer: Self-pay | Admitting: Family Medicine

## 2019-12-11 ENCOUNTER — Ambulatory Visit (INDEPENDENT_AMBULATORY_CARE_PROVIDER_SITE_OTHER): Payer: PRIVATE HEALTH INSURANCE | Admitting: Family Medicine

## 2019-12-11 VITALS — BP 126/82 | HR 81 | Temp 97.6°F | Resp 12 | Ht 73.0 in | Wt 304.1 lb

## 2019-12-11 DIAGNOSIS — M5441 Lumbago with sciatica, right side: Secondary | ICD-10-CM

## 2019-12-11 DIAGNOSIS — G4730 Sleep apnea, unspecified: Secondary | ICD-10-CM

## 2019-12-11 DIAGNOSIS — R7401 Elevation of levels of liver transaminase levels: Secondary | ICD-10-CM

## 2019-12-11 DIAGNOSIS — I10 Essential (primary) hypertension: Secondary | ICD-10-CM | POA: Diagnosis not present

## 2019-12-11 DIAGNOSIS — G8929 Other chronic pain: Secondary | ICD-10-CM

## 2019-12-11 LAB — COMPREHENSIVE METABOLIC PANEL
ALT: 62 U/L — ABNORMAL HIGH (ref 0–53)
AST: 43 U/L — ABNORMAL HIGH (ref 0–37)
Albumin: 4.3 g/dL (ref 3.5–5.2)
Alkaline Phosphatase: 69 U/L (ref 39–117)
BUN: 12 mg/dL (ref 6–23)
CO2: 28 mEq/L (ref 19–32)
Calcium: 9.3 mg/dL (ref 8.4–10.5)
Chloride: 103 mEq/L (ref 96–112)
Creatinine, Ser: 0.84 mg/dL (ref 0.40–1.50)
GFR: 119.59 mL/min (ref >60.00)
Glucose, Bld: 94 mg/dL (ref 70–99)
Potassium: 3.7 mEq/L (ref 3.5–5.1)
Sodium: 140 mEq/L (ref 135–145)
Total Bilirubin: 0.6 mg/dL (ref 0.2–1.2)
Total Protein: 7.7 g/dL (ref 6.0–8.3)

## 2019-12-11 LAB — CBC WITH DIFFERENTIAL/PLATELET
Basophils Absolute: 0.1 10*3/uL (ref 0.0–0.1)
Basophils Relative: 0.6 % (ref 0.0–3.0)
Eosinophils Absolute: 0.2 10*3/uL (ref 0.0–0.7)
Eosinophils Relative: 2.6 % (ref 0.0–5.0)
HCT: 50.6 % (ref 39.0–52.0)
Hemoglobin: 17.7 g/dL — ABNORMAL HIGH (ref 13.0–17.0)
Lymphocytes Relative: 25.5 % (ref 12.0–46.0)
Lymphs Abs: 2.1 10*3/uL (ref 0.7–4.0)
MCHC: 34.9 g/dL (ref 30.0–36.0)
MCV: 92.9 fl (ref 78.0–100.0)
Monocytes Absolute: 0.7 10*3/uL (ref 0.1–1.0)
Monocytes Relative: 8.4 % (ref 3.0–12.0)
Neutro Abs: 5.3 10*3/uL (ref 1.4–7.7)
Neutrophils Relative %: 62.9 % (ref 43.0–77.0)
Platelets: 166 10*3/uL (ref 150.0–400.0)
RBC: 5.44 Mil/uL (ref 4.22–5.81)
RDW: 12.9 % (ref 11.5–15.5)
WBC: 8.4 10*3/uL (ref 4.0–10.5)

## 2019-12-11 MED ORDER — GABAPENTIN 300 MG PO CAPS: 300.0000 mg | ORAL_CAPSULE | Freq: Every day | ORAL | 3 refills | Status: DC

## 2019-12-11 MED ORDER — PREDNISONE 20 MG PO TABS: ORAL_TABLET | ORAL | 0 refills | Status: DC

## 2019-12-11 NOTE — Patient Instructions (Signed)
A few things to remember from today's visit:  Take prednisone with breakfast. Gabapentin 300 mg to take at bedtime. We will try to find out what happened with the lumbar MRI report. Appointment with orthopedics will be arranged.  Try to monitor your blood pressure regularly.   If you need refills please call your pharmacy. Do not use My Chart to request refills or for acute issues that need immediate attention.    Please be sure medication list is accurate. If a new problem present, please set up appointment sooner than planned today.

## 2019-12-11 NOTE — Progress Notes (Signed)
ACUTE VISIT  Chief Complaint  Patient presents with  . Leg Pain    right leg pain with numbness, unable to sleep due to pain. Started 3 yrs ago, getting worse   HPI: [name] is a 44 y.o. male, who is here today with above complaint. He has had intermittent episodes of back pain for the past 3 years, increasing in frequency. Denies associated fever,chills,abdominal pain,or urinary symptoms. Pain is gradually getting worse. Exacerbated by walking and alleviated when lying on right side.  No hx of trauma. Lumbar MRI done last year in 02/2019, I did not received report.  Numbness and tingling that is started on lateral aspect of the thigh down to foot. He has not noticed saddle anesthesia or bowel/bladder dysfunction.  He is taking Ibuprofen for pain.  HTN: He is on Amlodipine 5-20 mg daily. Negative for chest pain, dyspnea, palpitation, claudication, focal weakness, or edema.  Lab Results  Component Value Date   CREATININE 0.92 01/21/2019   BUN 9 01/21/2019   NA 142 01/21/2019   K 3.9 01/21/2019   CL 103 01/21/2019   CO2 25 01/21/2019   Hx of elevated transaminases.  Component     Latest Ref Rng & Units 01/21/2019  Total Bilirubin     0.2 - 1.2 mg/dL 0.4  Alkaline Phosphatase     39 - 117 U/L 69  AST     0 - 37 U/L 42 (H)  ALT     0 - 53 U/L 64 (H)  Total Protein     6.0 - 8.3 g/dL 7.2  Albumin     3.5 - 5.2 g/dL 4.4  Calcium     8.4 - 10.5 mg/dL 9.2  GFR     >60.00 mL/min 108.10   Yesterday he woke up with severe headache, :bad" retro ocular, excruciated 2-3 hours. Ibuprofen helped. + Nausea. No vomiting or photophobia. Loud snoring and he fiance has weakness episodes of apnea and so the he is ex-wife.  Review of Systems  Constitutional: Positive for fatigue. Negative for activity change, appetite change and fever.  HENT: Negative for mouth sores, nosebleeds and sore throat.   Eyes: Negative for redness and visual disturbance.  Respiratory:  Negative for cough and wheezing.   Genitourinary: Negative for decreased urine volume, dysuria and hematuria.  Skin: Negative for rash and wound.  Neurological: Negative for syncope and facial asymmetry.  Psychiatric/Behavioral: Positive for sleep disturbance. Negative for confusion.  Rest see pertinent positives and negatives per HPI.  Current Outpatient Medications on File Prior to Visit  Medication Sig Dispense Refill  . acetaminophen (TYLENOL) 325 MG tablet Take 650 mg by mouth every 6 (six) hours as needed (pain).    Marland Kitchen amLODipine-benazepril (LOTREL) 5-20 MG capsule Take 1 capsule by mouth daily. 30 capsule 2  . diphenhydrAMINE (BENADRYL) 25 mg capsule Take 1 capsule (25 mg total) by mouth every 6 (six) hours as needed. With  reglan for headache. 30 capsule 0  . ibuprofen (ADVIL,MOTRIN) 200 MG tablet Take 200-400 mg by mouth every 6 (six) hours as needed for moderate pain.    . sildenafil (REVATIO) 20 MG tablet Take 1-2 tablets (20-40 mg total) by mouth daily as needed. 60 tablet 1  . famotidine (PEPCID) 20 MG tablet Take 1 tablet (20 mg total) by mouth 2 (two) times daily. (Patient not taking: Reported on 12/11/2019) 10 tablet 0  . sulfacetamide (BLEPH-10) 10 % ophthalmic solution Place 2 drops into the right eye every 4 (  four) hours. (Patient not taking: Reported on 12/11/2019) 15 mL 0   No current facility-administered medications on file prior to visit.    Past Medical History:  Diagnosis Date  . Essential hypertension    Allergies  Allergen Reactions  . Shellfish Allergy Hives and Rash    Social History   Socioeconomic History  . Marital status: Single    Spouse name: Not on file  . Number of children: Not on file  . Years of education: Not on file  . Highest education level: Not on file  Occupational History  . Not on file  Tobacco Use  . Smoking status: Current Every Day Smoker  . Smokeless tobacco: Never Used  Substance and Sexual Activity  . Alcohol use: No  .  Drug use: No  . Sexual activity: Not on file  Other Topics Concern  . Not on file  Social History Narrative  . Not on file   Social Determinants of Health   Financial Resource Strain:   . Difficulty of Paying Living Expenses:   Food Insecurity:   . Worried About Charity fundraiser in the Last Year:   . Arboriculturist in the Last Year:   Transportation Needs:   . Film/video editor (Medical):   Marland Kitchen Lack of Transportation (Non-Medical):   Physical Activity:   . Days of Exercise per Week:   . Minutes of Exercise per Session:   Stress:   . Feeling of Stress :   Social Connections:   . Frequency of Communication with Friends and Family:   . Frequency of Social Gatherings with Friends and Family:   . Attends Religious Services:   . Active Member of Clubs or Organizations:   . Attends Archivist Meetings:   Marland Kitchen Marital Status:     Vitals:   12/11/19 0844  BP: 126/82  Pulse: 81  Resp: 12  Temp: 97.6 F (36.4 C)  SpO2: 96%   Body mass index is 40.12 kg/m.  Physical Exam  Nursing note and vitals reviewed. Constitutional: He is oriented to person, place, and time. He appears well-developed. No distress.  HENT:  Head: Normocephalic and atraumatic.  Mouth/Throat: Oropharynx is clear and moist and mucous membranes are normal.  Eyes: Conjunctivae are normal.  Cardiovascular: Normal rate and regular rhythm.  No murmur heard. Pulses:      Dorsalis pedis pulses are 2+ on the right side and 2+ on the left side.  Respiratory: Effort normal and breath sounds normal. No respiratory distress.  GI: Soft. He exhibits no mass. There is no hepatomegaly. There is no abdominal tenderness.  Musculoskeletal:        General: No edema.     Lumbar back: No tenderness or bony tenderness.     Comments: No significant deformity appreciated. No tenderness upon palpation of paraspinal muscles. Pain elicited with movement on exam table during examination. No local edema or erythema  appreciated, no suspicious lesions.  Lymphadenopathy:    He has no cervical adenopathy.  Neurological: He is alert and oriented to person, place, and time. He has normal strength.  Reflex Scores:      Patellar reflexes are 2+ on the right side and 2+ on the left side. Can walk on tip toes. Antalgic gait. SLR: Right thigh pain elicited.  Skin: Skin is warm. No rash noted. No erythema.  Psychiatric: He has a normal mood and affect.  Well groomed,good eye contact.   ASSESSMENT AND PLAN:  [name]  was seen today for leg pain.  Diagnoses and all orders for this visit:  Orders Placed This Encounter  Procedures  . Comprehensive metabolic panel  . CBC with Differential/Platelet  . Ambulatory referral to Orthopedic Surgery  . Nocturnal polysomnography (NPSG)   Lab Results  Component Value Date   ALT 62 (H) 12/11/2019   AST 43 (H) 12/11/2019   ALKPHOS 69 12/11/2019   BILITOT 0.6 12/11/2019   Lab Results  Component Value Date   CREATININE 0.84 12/11/2019   BUN 12 12/11/2019   NA 140 12/11/2019   K 3.7 12/11/2019   CL 103 12/11/2019   CO2 28 12/11/2019   Lab Results  Component Value Date   WBC 8.4 12/11/2019   HGB 17.7 (H) 12/11/2019   HCT 50.6 12/11/2019   MCV 92.9 12/11/2019   PLT 166.0 12/11/2019    Chronic right-sided low back pain with right-sided sciatica Chronic and getting worse. Will try to obtain report of lumbar MRI done in 02/2019. He agrees with trying Prednisone taper, side effects discussed. Gabapentin 300 mg at bedtime may also help.  Instructed about warning signs. Appt with ortho will be arranged.  -     predniSONE (DELTASONE) 20 MG tablet; 3 tabs for 3 days, 2 tabs for 3 days, 1 tabs for 3 days, and 1/2 tab for 3 days. Take tables together with breakfast. -     gabapentin (NEURONTIN) 300 MG capsule; Take 1 capsule (300 mg total) by mouth at bedtime.  Elevated transaminase level Further recommendations according to lab results. Wt loss will  help.  Essential hypertension BP adequately controlled. No changes in current management. Continue low salt diet.  Sleep apnea, unspecified type We discussed dx,prognosis,and possible complications. Sleep study will be arranged. Wt loss recommended.    Return in about 5 months (around 05/12/2020).   Margarie Mcguirt G. Martinique, MD  [facility]. [place] office.  Discharge Instructions   None    A few things to remember from today's visit:  Take prednisone with breakfast. Gabapentin 300 mg to take at bedtime. We will try to find out what happened with the lumbar MRI report. Appointment with orthopedics will be arranged.  Try to monitor your blood pressure regularly.   If you need refills please call your pharmacy. Do not use My Chart to request refills or for acute issues that need immediate attention.    Please be sure medication list is accurate. If a new problem present, please set up appointment sooner than planned today.

## 2019-12-19 ENCOUNTER — Encounter: Payer: Self-pay | Admitting: Family Medicine

## 2019-12-19 ENCOUNTER — Ambulatory Visit (INDEPENDENT_AMBULATORY_CARE_PROVIDER_SITE_OTHER): Payer: PRIVATE HEALTH INSURANCE | Admitting: Family Medicine

## 2019-12-19 ENCOUNTER — Other Ambulatory Visit: Payer: Self-pay

## 2019-12-19 DIAGNOSIS — G8929 Other chronic pain: Secondary | ICD-10-CM

## 2019-12-19 DIAGNOSIS — M5441 Lumbago with sciatica, right side: Secondary | ICD-10-CM

## 2019-12-19 MED ORDER — BACLOFEN 10 MG PO TABS: 5.0000 mg | ORAL_TABLET | Freq: Three times a day (TID) | ORAL | 3 refills | Status: DC | PRN

## 2019-12-19 MED ORDER — TRAMADOL HCL 50 MG PO TABS: 50.0000 mg | ORAL_TABLET | Freq: Two times a day (BID) | ORAL | 0 refills | Status: DC | PRN

## 2019-12-19 NOTE — Progress Notes (Signed)
Office Visit Note   [name]           Date of Birth: [birth year]           MRN: [id number] Visit Date: 12/19/2019 Requested by: Martinique, Betty G, MD [id number] [place],  [place] [address] PCP: Martinique, Betty G, MD  Subjective: Chief Complaint  Patient presents with  . Lower Back - Pain    Pain right lower back and especially down the right leg. Chronic - x approx 3 years. Started after lifting furniture. Pain went away, but came back. Hurts worse with much standing and lying on back or left side. No relief with gabapentin.    HPI: He is here with low back and right leg pain.  Symptoms started about 3 years ago after lifting a heavy mattress.  He has had intermittent pain which has gotten steadily worse over the past year.  Pain travels down the back of the leg into the right foot and causes intermittent numbness in his foot.  Denies bowel or bladder dysfunction.  Pain is better when sitting, worse when standing a long time, better when lying on his side at night.  No fevers or chills.  He supposedly had an MRI scan last summer but nobody seems to be able to find the study.  He thinks it was done at [place].  Recently he was given gabapentin but it does not seem to be helping.  She works for [employer] and his job requires a lot of standing.  He does better when he is able to take sitting breaks.                ROS:   All other systems were reviewed and are negative.  Objective: Vital Signs: There were no vitals taken for this visit.  Physical Exam:  General:  Alert and oriented, in no acute distress. Pulm:  Breathing unlabored. Psy:  Normal mood, congruent affect. Skin: No visible rash. Low back: He has some tenderness in the midline L5-S1 level, and also in the right sciatic notch.  Straight leg raise is equivocal, no pain with internal hip rotation.  Lower extremity strength and reflexes remain normal.  Imaging: No results found.  Assessment &  Plan: 1.  Chronic low back pain with right-sided sciatica, suspect lumbar disc protrusion -We will refer to physical therapy.  Referral for epidural steroid injection.  Trial of baclofen and tramadol as needed. -Note for work to take sitting breaks when needed for the next 3 months. -If he fails to improve we may need to order another MRI scan.     Procedures: No procedures performed  No notes on file     PMFS History: Patient Active Problem List   Diagnosis Date Noted  . Polycythemia 01/22/2019  . Hypertensive urgency 05/31/2016  . Chest pain 05/31/2016  . Essential hypertension 05/31/2016   Past Medical History:  Diagnosis Date  . Essential hypertension     Family History  Problem Relation Age of Onset  . Hypertension Mother   . Hypertension Father   . Cancer Maternal Grandmother   . Cancer Maternal Grandfather     Past Surgical History:  Procedure Laterality Date  . CHOLECYSTECTOMY    . HAND TENDON SURGERY Right    Social History   Occupational History  . Not on file  Tobacco Use  . Smoking status: Current Every Day Smoker  . Smokeless tobacco: Never Used  Substance and Sexual Activity  .  Alcohol use: No  . Drug use: No  . Sexual activity: Not on file

## 2019-12-24 ENCOUNTER — Telehealth: Payer: Self-pay | Admitting: Family Medicine

## 2019-12-24 NOTE — Telephone Encounter (Signed)
Patient called. Says his pain is not better. The pain pills are not helping.

## 2019-12-24 NOTE — Telephone Encounter (Signed)
I called and reached the patient's voice mail. No new medicine nor medication change given. He is to call Dr. Hayes Ludwig office for an appointment and hopefully get in there soon, as he is having to wait on the [facility]. The patient still needs to start PT, as well.

## 2019-12-24 NOTE — Telephone Encounter (Signed)
The patient has called [facility] PT, but had to leave a message - has not heard back from them yet. The [facility] appointment is pending. The patient said that even doubling up on tramadol and baclofen does not help his pain. Anything else to offer?

## 2019-12-24 NOTE — Telephone Encounter (Signed)
PT and an ESI were ordered on the day of his visit.

## 2019-12-25 ENCOUNTER — Other Ambulatory Visit: Payer: PRIVATE HEALTH INSURANCE

## 2019-12-30 ENCOUNTER — Other Ambulatory Visit: Payer: PRIVATE HEALTH INSURANCE

## 2020-01-20 ENCOUNTER — Telehealth: Payer: Self-pay | Admitting: Physical Medicine and Rehabilitation

## 2020-01-20 ENCOUNTER — Encounter: Payer: PRIVATE HEALTH INSURANCE | Admitting: Physical Medicine and Rehabilitation

## 2020-01-20 NOTE — Telephone Encounter (Signed)
Patient called needing to reschedule his appointment. The number to contact patient is [phone]

## 2020-01-20 NOTE — Telephone Encounter (Signed)
Pt did not have a driver for 1/56/15 appt is r/s for 01/23/20 with driver.

## 2020-01-23 ENCOUNTER — Encounter: Payer: Self-pay | Admitting: Physical Medicine and Rehabilitation

## 2020-01-23 ENCOUNTER — Other Ambulatory Visit: Payer: Self-pay

## 2020-01-23 ENCOUNTER — Ambulatory Visit (INDEPENDENT_AMBULATORY_CARE_PROVIDER_SITE_OTHER): Payer: PRIVATE HEALTH INSURANCE | Admitting: Physical Medicine and Rehabilitation

## 2020-01-23 ENCOUNTER — Ambulatory Visit: Payer: Self-pay

## 2020-01-23 VITALS — BP 185/120

## 2020-01-23 DIAGNOSIS — M5416 Radiculopathy, lumbar region: Secondary | ICD-10-CM | POA: Diagnosis not present

## 2020-01-23 MED ORDER — METHYLPREDNISOLONE ACETATE 80 MG/ML IJ SUSP
40.0000 mg | Freq: Once | INTRAMUSCULAR | Status: AC
  Administered 2020-01-23: 40 mg

## 2020-01-23 NOTE — Progress Notes (Signed)
Pt states some pain in the lower back that radiates into the right leg all the way down. Standing and walking makes pain worse. Sitting helps with pain.   .Numeric Pain Rating Scale and Functional Assessment Average Pain 10   In the last MONTH (on 0-10 scale) has pain interfered with the following?  1. General activity like being  able to carry out your everyday physical activities such as walking, climbing stairs, carrying groceries, or moving a chair?  Rating(10)   +Driver, -BT, -Dye Allergies.

## 2020-01-24 NOTE — Procedures (Signed)
Lumbosacral Transforaminal Epidural Steroid Injection - Sub-Pedicular Approach with Fluoroscopic Guidance  [name]      Date of Birth: 1976/07/05 MRN: [id number] PCP: Martinique, Betty G, MD      Visit Date: 01/23/2020   Universal Protocol:    Date/Time: 01/23/2020  Consent Given By: the patient  Position: PRONE  Additional Comments: Vital signs were monitored before and after the procedure. Patient was prepped and draped in the usual sterile fashion. The correct patient, procedure, and site was verified.   Injection Procedure Details:  Procedure Site One Meds Administered:  Meds ordered this encounter  Medications  . methylPREDNISolone acetate (DEPO-MEDROL) injection 40 mg    Laterality: Right  Location/Site:  L5-S1  Needle size: 22 G  Needle type: Spinal  Needle Placement: Transforaminal  Findings:    -Comments: Excellent flow of contrast along the nerve, nerve root and into the epidural space.  Patient really did not tolerate much of the injection very well.  Even from numbing the skin with a 27-gauge needle to just moving through the musculature area which is usually not an issue because significant amount of pain.  We did provide extra anesthetic as we went along we were able to complete the injection but significant pain response overall.  Would suggest if further injections needed that he have preprocedure Valium.  Procedure Details: After squaring off the end-plates to get a true AP view, the C-arm was positioned so that an oblique view of the foramen as noted above was visualized. The target area is just inferior to the "nose of the scotty dog" or sub pedicular. The soft tissues overlying this structure were infiltrated with 2-3 ml. of 1% Lidocaine without Epinephrine.  The spinal needle was inserted toward the target using a "trajectory" view along the fluoroscope beam.  Under AP and lateral visualization, the needle was advanced so it did not puncture  dura and was located close the 6 O'Clock position of the pedical in AP tracterory. Biplanar projections were used to confirm position. Aspiration was confirmed to be negative for CSF and/or blood. A 1-2 ml. volume of Isovue-250 was injected and flow of contrast was noted at each level. Radiographs were obtained for documentation purposes.   After attaining the desired flow of contrast documented above, a 0.5 to 1.0 ml test dose of 0.25% Marcaine was injected into each respective transforaminal space.  The patient was observed for 90 seconds post injection.  After no sensory deficits were reported, and normal lower extremity motor function was noted,   the above injectate was administered so that equal amounts of the injectate were placed at each foramen (level) into the transforaminal epidural space.   Additional Comments:  No complications occurred Dressing: 2 x 2 sterile gauze and Band-Aid    Post-procedure details: Patient was observed during the procedure. Post-procedure instructions were reviewed.  Patient left the clinic in stable condition.

## 2020-01-24 NOTE — Progress Notes (Signed)
Richard Patterson - 44 y.o. male MRN [id number]  Date of birth: 11/29/1975  Office Visit Note: Visit Date: 01/23/2020 PCP: Martinique, Betty G, MD Referred by: Martinique, Betty G, MD  Subjective: Chief Complaint  Patient presents with  . Lower Back - Pain  . Right Leg - Pain   HPI:  Richard Patterson is a 44 y.o. male who comes in today at the request of Richard Patterson for planned Right L5-S1 Lumbar epidural steroid injection with fluoroscopic guidance.  The patient has failed conservative care including home exercise, medications, time and activity modification.  This injection will be diagnostic and hopefully therapeutic.  Please see requesting physician notes for further details and justification.  Clinically the patient is having really nondermatomal right hip and leg pain.  This is been ongoing now for 3 years without any relief.  His case is complicated by morbid obesity as well as significant hypertension.  Today his blood pressure was 185/120.  Some of this obviously could be because he is having an injection but obviously very high.  We had to take this manually because he is a large individual.  He said he has not taken his blood pressure medicine in a few days because the pharmacy did not have it.  I have cautioned him that he does need to get that taken care of and he can follow-up with his primary care physician Dr. Martinique if necessary.  I did find it okay given he was really not having any symptoms clinically to go ahead and do the injection today with a reduced amount of steroid medication.  Also interestingly, an order for MRI was placed by Dr. Martinique and he claims that he had the MRI done at [facility] but there is really no record of it in the systems that we can look at and there is been no record of even the report coming back to Dr. Martinique.  Depending on relief with injection he really would need some imaging to see what was going on here.   ROS Otherwise per HPI.  Assessment &  Plan: Visit Diagnoses:  1. Lumbar radiculopathy     Plan: No additional findings.   Meds & Orders:  Meds ordered this encounter  Medications  . methylPREDNISolone acetate (DEPO-MEDROL) injection 40 mg    Orders Placed This Encounter  Procedures  . XR C-ARM NO REPORT  . Epidural Steroid injection    Follow-up: Return for visit to requesting physician as needed.   Procedures: No procedures performed  Lumbosacral Transforaminal Epidural Steroid Injection - Sub-Pedicular Approach with Fluoroscopic Guidance  [name]      Date of Birth: [birth year] MRN: [id number] PCP: Martinique, Betty G, MD      Visit Date: 01/23/2020   Universal Protocol:    Date/Time: 01/23/2020  Consent Given By: the patient  Position: PRONE  Additional Comments: Vital signs were monitored before and after the procedure. Patient was prepped and draped in the usual sterile fashion. The correct patient, procedure, and site was verified.   Injection Procedure Details:  Procedure Site One Meds Administered:  Meds ordered this encounter  Medications  . methylPREDNISolone acetate (DEPO-MEDROL) injection 40 mg    Laterality: Right  Location/Site:  L5-S1  Needle size: 22 Patterson  Needle type: Spinal  Needle Placement: Transforaminal  Findings:    -Comments: Excellent flow of contrast along the nerve, nerve root and into the epidural space.  Patient really did not tolerate much of the injection very well.  Even from numbing the skin with a 27-gauge needle to just moving through the musculature area which is usually not an issue because significant amount of pain.  We did provide extra anesthetic as we went along we were able to complete the injection but significant pain response overall.  Would suggest if further injections needed that he have preprocedure Valium.  Procedure Details: After squaring off the end-plates to get a true AP view, the C-arm was positioned so that an oblique view of  the foramen as noted above was visualized. The target area is just inferior to the "nose of the scotty dog" or sub pedicular. The soft tissues overlying this structure were infiltrated with 2-3 ml. of 1% Lidocaine without Epinephrine.  The spinal needle was inserted toward the target using a "trajectory" view along the fluoroscope beam.  Under AP and lateral visualization, the needle was advanced so it did not puncture dura and was located close the 6 O'Clock position of the pedical in AP tracterory. Biplanar projections were used to confirm position. Aspiration was confirmed to be negative for CSF and/or blood. A 1-2 ml. volume of Isovue-250 was injected and flow of contrast was noted at each level. Radiographs were obtained for documentation purposes.   After attaining the desired flow of contrast documented above, a 0.5 to 1.0 ml test dose of 0.25% Marcaine was injected into each respective transforaminal space.  The patient was observed for 90 seconds post injection.  After no sensory deficits were reported, and normal lower extremity motor function was noted,   the above injectate was administered so that equal amounts of the injectate were placed at each foramen (level) into the transforaminal epidural space.   Additional Comments:  No complications occurred Dressing: 2 x 2 sterile gauze and Band-Aid    Post-procedure details: Patient was observed during the procedure. Post-procedure instructions were reviewed.  Patient left the clinic in stable condition.      Clinical History: No specialty comments available.     Objective:  VS:  HT:    WT:   BMI:     BP:(!) 185/120  HR: bpm  TEMP: ( )  RESP:  Physical Exam Constitutional:      General: He is not in acute distress.    Appearance: Normal appearance. He is obese. He is not ill-appearing.  HENT:     Head: Normocephalic and atraumatic.     Right Ear: External ear normal.     Left Ear: External ear normal.  Eyes:      Extraocular Movements: Extraocular movements intact.  Cardiovascular:     Rate and Rhythm: Normal rate.     Pulses: Normal pulses.  Abdominal:     General: There is no distension.     Palpations: Abdomen is soft.  Musculoskeletal:        General: No tenderness or signs of injury.     Right lower leg: No edema.     Left lower leg: No edema.     Comments: Patient has good distal strength without clonus.  Skin:    Findings: No erythema or rash.  Neurological:     General: No focal deficit present.     Mental Status: He is alert and oriented to person, place, and time.     Sensory: No sensory deficit.     Motor: No weakness or abnormal muscle tone.     Coordination: Coordination normal.  Psychiatric:        Mood and Affect: Mood normal.  Behavior: Behavior normal.      Imaging: XR C-ARM NO REPORT  Result Date: 01/23/2020 Please see Notes tab for imaging impression.

## 2020-02-12 ENCOUNTER — Ambulatory Visit (HOSPITAL_BASED_OUTPATIENT_CLINIC_OR_DEPARTMENT_OTHER): Payer: PRIVATE HEALTH INSURANCE | Attending: Family Medicine | Admitting: Internal Medicine

## 2020-03-23 ENCOUNTER — Ambulatory Visit (HOSPITAL_COMMUNITY)
Admission: EM | Admit: 2020-03-23 | Discharge: 2020-03-23 | Disposition: A | Discharge status: Home / Self Care | Payer: Self-pay | Attending: Family Medicine | Admitting: Family Medicine

## 2020-03-23 ENCOUNTER — Ambulatory Visit (INDEPENDENT_AMBULATORY_CARE_PROVIDER_SITE_OTHER): Payer: Self-pay

## 2020-03-23 ENCOUNTER — Other Ambulatory Visit: Payer: Self-pay

## 2020-03-23 ENCOUNTER — Encounter (HOSPITAL_COMMUNITY): Payer: Self-pay

## 2020-03-23 DIAGNOSIS — M25572 Pain in left ankle and joints of left foot: Secondary | ICD-10-CM

## 2020-03-23 DIAGNOSIS — S93492A Sprain of other ligament of left ankle, initial encounter: Secondary | ICD-10-CM

## 2020-03-23 DIAGNOSIS — S99812A Other specified injuries of left ankle, initial encounter: Secondary | ICD-10-CM

## 2020-03-23 MED ORDER — IBUPROFEN 800 MG PO TABS: 800.0000 mg | ORAL_TABLET | Freq: Three times a day (TID) | ORAL | 0 refills | Status: DC

## 2020-03-23 NOTE — ED Triage Notes (Signed)
Pt is here after slipping and twisting his left ankle wrong this morning, pt has taken Advil to relieve discomfort.

## 2020-03-23 NOTE — Discharge Instructions (Signed)
Use ice and elevation to reduce the pain and swelling Take ibuprofen 3 times a day with food.  This will help with the pain Wear ankle brace Limit prolonged standing and walking Follow-up with sports medicine if you fail to improve

## 2020-03-23 NOTE — ED Provider Notes (Signed)
[facility]    CSN: [id number] Arrival date & time: 03/23/20  1712      History   Chief Complaint Chief Complaint  Patient presents with  . Ankle Injury    HPI Richard Patterson is a 44 y.o. male.   HPI Patient states he missed a step going down stairs and fell down 4 stairs.  He twisted his left ankle.  It was in inversion type of injury.  Now he can hardly bear weight.  It is swollen and painful in the lateral aspect. He has hypertension.  He has not yet taken his medication today's.  His blood pressure is very high.  I advised him that he needs to take the blood pressure medicine every day at the same time of day. He does not have any headache chest pain dizziness nausea or symptoms from his severe hypertension. Past Medical History:  Diagnosis Date  . Essential hypertension     Patient Active Problem List   Diagnosis Date Noted  . Polycythemia 01/22/2019  . Hypertensive urgency 05/31/2016  . Chest pain 05/31/2016  . Essential hypertension 05/31/2016    Past Surgical History:  Procedure Laterality Date  . CHOLECYSTECTOMY    . HAND TENDON SURGERY Right        Home Medications    Prior to Admission medications   Medication Sig Start Date End Date Taking? Authorizing Provider  acetaminophen (TYLENOL) 325 MG tablet Take 650 mg by mouth every 6 (six) hours as needed (pain).    [provider]  amLODipine-benazepril (LOTREL) 5-20 MG capsule Take 1 capsule by mouth daily. 06/17/19   Martinique, Betty G, MD  baclofen (LIORESAL) 10 MG tablet Take 0.5-1 tablets (5-10 mg total) by mouth 3 (three) times daily as needed for muscle spasms. 12/19/19   Hilts, Legrand Como, MD  diphenhydrAMINE (BENADRYL) 25 mg capsule Take 1 capsule (25 mg total) by mouth every 6 (six) hours as needed. With  reglan for headache. 06/01/16   Charlesetta Shanks, MD  gabapentin (NEURONTIN) 300 MG capsule Take 1 capsule (300 mg total) by mouth at bedtime. 12/11/19   Martinique, Betty G, MD  ibuprofen  (ADVIL) 800 MG tablet Take 1 tablet (800 mg total) by mouth 3 (three) times daily. 03/23/20   Raylene Everts, MD  predniSONE (DELTASONE) 20 MG tablet 3 tabs for 3 days, 2 tabs for 3 days, 1 tabs for 3 days, and 1/2 tab for 3 days. Take tables together with breakfast. 12/11/19   Martinique, Betty G, MD  sildenafil (REVATIO) 20 MG tablet Take 1-2 tablets (20-40 mg total) by mouth daily as needed. 06/17/19   Martinique, Betty G, MD  sulfacetamide (BLEPH-10) 10 % ophthalmic solution Place 2 drops into the right eye every 4 (four) hours. 09/10/18   Lawyer, Harrell Gave, PA-C  traMADol (ULTRAM) 50 MG tablet Take 1 tablet (50 mg total) by mouth 2 (two) times daily as needed. 12/19/19   Hilts, Legrand Como, MD    Family History Family History  Problem Relation Age of Onset  . Hypertension Mother   . Hypertension Father   . Cancer Maternal Grandmother   . Cancer Maternal Grandfather     Social History Social History   Tobacco Use  . Smoking status: Current Every Day Smoker    Types: Cigarettes  . Smokeless tobacco: Never Used  Substance Use Topics  . Alcohol use: No  . Drug use: No     Allergies   Shellfish allergy   Review of Systems Review of  Systems  See HPI Physical Exam Triage Vital Signs ED Triage Vitals  Enc Vitals Group     BP 03/23/20 1848 (!) 208/132     Pulse Rate 03/23/20 1848 79     Resp 03/23/20 1848 (!) 21     Temp 03/23/20 1848 98.2 F (36.8 C)     Temp Source 03/23/20 1848 Oral     SpO2 03/23/20 1848 97 %     Weight --      Height --      Head Circumference --      Peak Flow --      Pain Score 03/23/20 1847 8     Pain Loc --      Pain Edu? --      Excl. in [place]? --    No data found.  Updated Vital Signs BP (!) 208/132 (BP Location: Right Arm)   Pulse 79   Temp 98.2 F (36.8 C) (Oral)   Resp (!) 21   SpO2 97%      Physical Exam Constitutional:      General: He is not in acute distress.    Appearance: He is well-developed. He is obese.  HENT:     Head:  Normocephalic and atraumatic.  Eyes:     Conjunctiva/sclera: Conjunctivae normal.     Pupils: Pupils are equal, round, and reactive to light.  Cardiovascular:     Rate and Rhythm: Normal rate.  Pulmonary:     Effort: Pulmonary effort is normal. No respiratory distress.  Abdominal:     General: There is no distension.     Palpations: Abdomen is soft.  Musculoskeletal:        General: Normal range of motion.     Cervical back: Normal range of motion.     Comments: Left ankle has swelling laterally.  Limited range of motion.  No pain with flexion-extension.  No instability to examination.  No tenderness in the foot or metatarsals  Skin:    General: Skin is warm and dry.  Neurological:     Mental Status: He is alert.     Gait: Gait abnormal.  Psychiatric:        Mood and Affect: Mood normal.        Behavior: Behavior normal.      UC Treatments / Results  Labs (all labs ordered are listed, but only abnormal results are displayed) Labs Reviewed - No data to display  EKG   Radiology DG Ankle Complete Left  Result Date: 03/23/2020 CLINICAL DATA:  Inversion injury EXAM: LEFT ANKLE COMPLETE - 3+ VIEW COMPARISON:  None. FINDINGS: No acute displaced fracture or malalignment. Ossicle or old injury adjacent to the medial malleolus. Mild soft tissue swelling. IMPRESSION: No acute osseous abnormality. Electronically Signed   By: Donavan Foil M.D.   On: 03/23/2020 20:00    Procedures Procedures (including critical care time)  Medications Ordered in UC Medications - No data to display  Initial Impression / Assessment and Plan / UC Course  I have reviewed the triage vital signs and the nursing notes.  Pertinent labs & imaging results that were available during my care of the patient were reviewed by me and considered in my medical decision making (see chart for details).     Normal x-ray discussed.  Patient is immobilized, discussed ankle sprain, refer to sports medicine if he  fails to improve Final Clinical Impressions(s) / UC Diagnoses   Final diagnoses:  Acute left ankle pain  Sprain of  anterior talofibular ligament of left ankle, initial encounter     Discharge Instructions     Use ice and elevation to reduce the pain and swelling Take ibuprofen 3 times a day with food.  This will help with the pain Wear ankle brace Limit prolonged standing and walking Follow-up with sports medicine if you fail to improve    ED Prescriptions    Medication Sig Dispense Auth. Provider   ibuprofen (ADVIL) 800 MG tablet Take 1 tablet (800 mg total) by mouth 3 (three) times daily. 21 tablet Raylene Everts, MD     PDMP not reviewed this encounter.   Raylene Everts, MD 03/23/20 2038

## 2020-04-21 ENCOUNTER — Encounter (HOSPITAL_COMMUNITY): Payer: Self-pay | Admitting: Pediatrics

## 2020-04-21 ENCOUNTER — Emergency Department (HOSPITAL_COMMUNITY)
Admission: EM | Admit: 2020-04-21 | Discharge: 2020-04-21 | Disposition: A | Discharge status: Home / Self Care | Payer: No Typology Code available for payment source | Attending: Emergency Medicine | Admitting: Emergency Medicine

## 2020-04-21 ENCOUNTER — Other Ambulatory Visit: Payer: Self-pay

## 2020-04-21 ENCOUNTER — Emergency Department (HOSPITAL_COMMUNITY): Payer: No Typology Code available for payment source

## 2020-04-21 DIAGNOSIS — M79604 Pain in right leg: Secondary | ICD-10-CM | POA: Insufficient documentation

## 2020-04-21 DIAGNOSIS — Z5321 Procedure and treatment not carried out due to patient leaving prior to being seen by health care provider: Secondary | ICD-10-CM | POA: Insufficient documentation

## 2020-04-21 DIAGNOSIS — I10 Essential (primary) hypertension: Secondary | ICD-10-CM | POA: Insufficient documentation

## 2020-04-21 DIAGNOSIS — M79605 Pain in left leg: Secondary | ICD-10-CM | POA: Insufficient documentation

## 2020-04-21 DIAGNOSIS — M549 Dorsalgia, unspecified: Secondary | ICD-10-CM | POA: Insufficient documentation

## 2020-04-21 DIAGNOSIS — R079 Chest pain, unspecified: Secondary | ICD-10-CM | POA: Insufficient documentation

## 2020-04-21 LAB — BASIC METABOLIC PANEL
Anion gap: 10 (ref 5–15)
BUN: 10 mg/dL (ref 6–20)
CO2: 23 mmol/L (ref 22–32)
Calcium: 8.8 mg/dL — ABNORMAL LOW (ref 8.9–10.3)
Chloride: 106 mmol/L (ref 98–111)
Creatinine, Ser: 0.99 mg/dL (ref 0.61–1.24)
GFR calc Af Amer: >60 mL/min (ref >60)
GFR calc non Af Amer: >60 mL/min (ref >60)
Glucose, Bld: 115 mg/dL — ABNORMAL HIGH (ref 70–99)
Potassium: 3.3 mmol/L — ABNORMAL LOW (ref 3.5–5.1)
Sodium: 139 mmol/L (ref 135–145)

## 2020-04-21 LAB — TROPONIN I (HIGH SENSITIVITY): Troponin I (High Sensitivity): 22 ng/L — ABNORMAL HIGH (ref <18)

## 2020-04-21 LAB — CBC
HCT: 46 % (ref 39.0–52.0)
Hemoglobin: 16 g/dL (ref 13.0–17.0)
MCH: 31.7 pg (ref 26.0–34.0)
MCHC: 34.8 g/dL (ref 30.0–36.0)
MCV: 91.1 fL (ref 80.0–100.0)
Platelets: 150 10*3/uL (ref 150–400)
RBC: 5.05 MIL/uL (ref 4.22–5.81)
RDW: 12.7 % (ref 11.5–15.5)
WBC: 11.4 10*3/uL — ABNORMAL HIGH (ref 4.0–10.5)
nRBC: 0 % (ref 0.0–0.2)

## 2020-04-21 MED ORDER — IBUPROFEN 400 MG PO TABS
400.0000 mg | ORAL_TABLET | Freq: Once | ORAL | Status: AC | PRN
  Administered 2020-04-21: 400 mg via ORAL
  Filled 2020-04-21: qty 1

## 2020-04-21 NOTE — ED Notes (Addendum)
Pt told this tech that he would like to leave, this tech removed his IV and wheeled pt outside. As pt was outside he threw himself on the ground. Pt was then assisted up back into wheelchair & was instructed not to continue throwing himself in the floor as this was the 2nd time. Pt then decided he would like to stay. Pt placed back in waiting room in wheelchair.

## 2020-04-21 NOTE — ED Notes (Signed)
Pt did not answer x 3 

## 2020-04-21 NOTE — ED Triage Notes (Signed)
Reported sudden onset chest pain radiates to back and legs. Stated hx of HTN.

## 2020-04-22 ENCOUNTER — Inpatient Hospital Stay (HOSPITAL_COMMUNITY)
Admission: EM | Admit: 2020-04-22 | Discharge: 2020-04-29 | DRG: 220 | Disposition: A | Discharge status: Home / Self Care | Payer: Self-pay | Attending: Surgery | Admitting: Surgery

## 2020-04-22 ENCOUNTER — Other Ambulatory Visit: Payer: Self-pay

## 2020-04-22 ENCOUNTER — Other Ambulatory Visit (HOSPITAL_COMMUNITY): Payer: Self-pay

## 2020-04-22 ENCOUNTER — Emergency Department (HOSPITAL_COMMUNITY): Payer: Self-pay | Admitting: Registered Nurse

## 2020-04-22 ENCOUNTER — Emergency Department (HOSPITAL_COMMUNITY): Payer: Self-pay

## 2020-04-22 ENCOUNTER — Encounter (HOSPITAL_COMMUNITY)
Admission: EM | Disposition: A | Discharge status: Home / Self Care | Payer: Self-pay | Source: Home / Self Care | Attending: Surgery

## 2020-04-22 ENCOUNTER — Encounter (HOSPITAL_COMMUNITY): Payer: Self-pay | Admitting: *Deleted

## 2020-04-22 ENCOUNTER — Ambulatory Visit (HOSPITAL_COMMUNITY)
Admission: EM | Admit: 2020-04-22 | Discharge: 2020-04-22 | Disposition: A | Discharge status: Home / Self Care | Payer: No Typology Code available for payment source

## 2020-04-22 ENCOUNTER — Encounter (HOSPITAL_COMMUNITY): Payer: Self-pay

## 2020-04-22 DIAGNOSIS — I10 Essential (primary) hypertension: Secondary | ICD-10-CM | POA: Diagnosis present

## 2020-04-22 DIAGNOSIS — F17210 Nicotine dependence, cigarettes, uncomplicated: Secondary | ICD-10-CM | POA: Diagnosis present

## 2020-04-22 DIAGNOSIS — Z9889 Other specified postprocedural states: Secondary | ICD-10-CM

## 2020-04-22 DIAGNOSIS — E669 Obesity, unspecified: Secondary | ICD-10-CM | POA: Diagnosis present

## 2020-04-22 DIAGNOSIS — I7102 Dissection of abdominal aorta: Secondary | ICD-10-CM

## 2020-04-22 DIAGNOSIS — R778 Other specified abnormalities of plasma proteins: Secondary | ICD-10-CM

## 2020-04-22 DIAGNOSIS — D751 Secondary polycythemia: Secondary | ICD-10-CM | POA: Diagnosis present

## 2020-04-22 DIAGNOSIS — E8770 Fluid overload, unspecified: Secondary | ICD-10-CM | POA: Diagnosis not present

## 2020-04-22 DIAGNOSIS — M549 Dorsalgia, unspecified: Secondary | ICD-10-CM | POA: Diagnosis present

## 2020-04-22 DIAGNOSIS — Z6839 Body mass index (BMI) 39.0-39.9, adult: Secondary | ICD-10-CM

## 2020-04-22 DIAGNOSIS — Z20822 Contact with and (suspected) exposure to covid-19: Secondary | ICD-10-CM | POA: Diagnosis present

## 2020-04-22 DIAGNOSIS — I454 Nonspecific intraventricular block: Secondary | ICD-10-CM | POA: Diagnosis not present

## 2020-04-22 DIAGNOSIS — I7103 Dissection of thoracoabdominal aorta: Secondary | ICD-10-CM

## 2020-04-22 DIAGNOSIS — R202 Paresthesia of skin: Secondary | ICD-10-CM | POA: Diagnosis not present

## 2020-04-22 DIAGNOSIS — Z91013 Allergy to seafood: Secondary | ICD-10-CM

## 2020-04-22 DIAGNOSIS — R079 Chest pain, unspecified: Secondary | ICD-10-CM

## 2020-04-22 DIAGNOSIS — I7100 Dissection of unspecified site of aorta: Secondary | ICD-10-CM | POA: Diagnosis present

## 2020-04-22 DIAGNOSIS — Z8249 Family history of ischemic heart disease and other diseases of the circulatory system: Secondary | ICD-10-CM

## 2020-04-22 DIAGNOSIS — J9811 Atelectasis: Secondary | ICD-10-CM | POA: Diagnosis not present

## 2020-04-22 DIAGNOSIS — I7101 Dissection of thoracic aorta: Principal | ICD-10-CM | POA: Diagnosis present

## 2020-04-22 DIAGNOSIS — R7989 Other specified abnormal findings of blood chemistry: Secondary | ICD-10-CM

## 2020-04-22 HISTORY — PX: AORTIC VALVE REPAIR: SHX6306

## 2020-04-22 LAB — COMPREHENSIVE METABOLIC PANEL
ALT: 39 U/L (ref 0–44)
AST: 34 U/L (ref 15–41)
Albumin: 3.5 g/dL (ref 3.5–5.0)
Alkaline Phosphatase: 58 U/L (ref 38–126)
Anion gap: 9 (ref 5–15)
BUN: 11 mg/dL (ref 6–20)
CO2: 26 mmol/L (ref 22–32)
Calcium: 8.6 mg/dL — ABNORMAL LOW (ref 8.9–10.3)
Chloride: 102 mmol/L (ref 98–111)
Creatinine, Ser: 0.82 mg/dL (ref 0.61–1.24)
GFR calc Af Amer: >60 mL/min (ref >60)
GFR calc non Af Amer: >60 mL/min (ref >60)
Glucose, Bld: 99 mg/dL (ref 70–99)
Potassium: 3.6 mmol/L (ref 3.5–5.1)
Sodium: 137 mmol/L (ref 135–145)
Total Bilirubin: 1.7 mg/dL — ABNORMAL HIGH (ref 0.3–1.2)
Total Protein: 6.6 g/dL (ref 6.5–8.1)

## 2020-04-22 LAB — POCT I-STAT 7, (LYTES, BLD GAS, ICA,H+H)
Acid-Base Excess: 2 mmol/L (ref 0.0–2.0)
Acid-base deficit: 1 mmol/L (ref 0.0–2.0)
Bicarbonate: 25.7 mmol/L (ref 20.0–28.0)
Bicarbonate: 27.3 mmol/L (ref 20.0–28.0)
Calcium, Ion: 1.17 mmol/L (ref 1.15–1.40)
Calcium, Ion: 1.03 mmol/L — ABNORMAL LOW (ref 1.15–1.40)
HCT: 45 % (ref 39.0–52.0)
HCT: 37 % — ABNORMAL LOW (ref 39.0–52.0)
Hemoglobin: 15.3 g/dL (ref 13.0–17.0)
Hemoglobin: 12.6 g/dL — ABNORMAL LOW (ref 13.0–17.0)
O2 Saturation: 92 %
O2 Saturation: 100 %
Potassium: 3.2 mmol/L — ABNORMAL LOW (ref 3.5–5.1)
Potassium: 3.3 mmol/L — ABNORMAL LOW (ref 3.5–5.1)
Sodium: 140 mmol/L (ref 135–145)
Sodium: 141 mmol/L (ref 135–145)
TCO2: 27 mmol/L (ref 22–32)
TCO2: 29 mmol/L (ref 22–32)
pCO2 arterial: 49.8 mmHg — ABNORMAL HIGH (ref 32.0–48.0)
pCO2 arterial: 44.4 mmHg (ref 32.0–48.0)
pH, Arterial: 7.32 — ABNORMAL LOW (ref 7.350–7.450)
pH, Arterial: 7.397 (ref 7.350–7.450)
pO2, Arterial: 71 mmHg — ABNORMAL LOW (ref 83.0–108.0)
pO2, Arterial: 258 mmHg — ABNORMAL HIGH (ref 83.0–108.0)

## 2020-04-22 LAB — CBC WITH DIFFERENTIAL/PLATELET
Abs Immature Granulocytes: 0.05 10*3/uL (ref 0.00–0.07)
Basophils Absolute: 0 10*3/uL (ref 0.0–0.1)
Basophils Relative: 0 %
Eosinophils Absolute: 0 10*3/uL (ref 0.0–0.5)
Eosinophils Relative: 0 %
HCT: 45.1 % (ref 39.0–52.0)
Hemoglobin: 15.3 g/dL (ref 13.0–17.0)
Immature Granulocytes: 0 %
Lymphocytes Relative: 18 %
Lymphs Abs: 2.1 10*3/uL (ref 0.7–4.0)
MCH: 31.8 pg (ref 26.0–34.0)
MCHC: 33.9 g/dL (ref 30.0–36.0)
MCV: 93.8 fL (ref 80.0–100.0)
Monocytes Absolute: 1.2 10*3/uL — ABNORMAL HIGH (ref 0.1–1.0)
Monocytes Relative: 10 %
Neutro Abs: 8.2 10*3/uL — ABNORMAL HIGH (ref 1.7–7.7)
Neutrophils Relative %: 72 %
Platelets: 134 10*3/uL — ABNORMAL LOW (ref 150–400)
RBC: 4.81 MIL/uL (ref 4.22–5.81)
RDW: 12.9 % (ref 11.5–15.5)
WBC: 11.6 10*3/uL — ABNORMAL HIGH (ref 4.0–10.5)
nRBC: 0 % (ref 0.0–0.2)

## 2020-04-22 LAB — POCT I-STAT, CHEM 8
BUN: 11 mg/dL (ref 6–20)
BUN: 11 mg/dL (ref 6–20)
BUN: 11 mg/dL (ref 6–20)
Calcium, Ion: 1.18 mmol/L (ref 1.15–1.40)
Calcium, Ion: 1.14 mmol/L — ABNORMAL LOW (ref 1.15–1.40)
Calcium, Ion: 1.07 mmol/L — ABNORMAL LOW (ref 1.15–1.40)
Chloride: 101 mmol/L (ref 98–111)
Chloride: 102 mmol/L (ref 98–111)
Chloride: 100 mmol/L (ref 98–111)
Creatinine, Ser: 0.6 mg/dL — ABNORMAL LOW (ref 0.61–1.24)
Creatinine, Ser: 0.8 mg/dL (ref 0.61–1.24)
Creatinine, Ser: 0.7 mg/dL (ref 0.61–1.24)
Glucose, Bld: 99 mg/dL (ref 70–99)
Glucose, Bld: 99 mg/dL (ref 70–99)
Glucose, Bld: 95 mg/dL (ref 70–99)
HCT: 43 % (ref 39.0–52.0)
HCT: 40 % (ref 39.0–52.0)
HCT: 36 % — ABNORMAL LOW (ref 39.0–52.0)
Hemoglobin: 14.6 g/dL (ref 13.0–17.0)
Hemoglobin: 13.6 g/dL (ref 13.0–17.0)
Hemoglobin: 12.2 g/dL — ABNORMAL LOW (ref 13.0–17.0)
Potassium: 3.3 mmol/L — ABNORMAL LOW (ref 3.5–5.1)
Potassium: 3.1 mmol/L — ABNORMAL LOW (ref 3.5–5.1)
Potassium: 3.3 mmol/L — ABNORMAL LOW (ref 3.5–5.1)
Sodium: 140 mmol/L (ref 135–145)
Sodium: 139 mmol/L (ref 135–145)
Sodium: 140 mmol/L (ref 135–145)
TCO2: 25 mmol/L (ref 22–32)
TCO2: 23 mmol/L (ref 22–32)
TCO2: 26 mmol/L (ref 22–32)

## 2020-04-22 LAB — I-STAT CHEM 8, ED
BUN: 14 mg/dL (ref 6–20)
Calcium, Ion: 1.05 mmol/L — ABNORMAL LOW (ref 1.15–1.40)
Chloride: 102 mmol/L (ref 98–111)
Creatinine, Ser: 0.7 mg/dL (ref 0.61–1.24)
Glucose, Bld: 96 mg/dL (ref 70–99)
HCT: 44 % (ref 39.0–52.0)
Hemoglobin: 15 g/dL (ref 13.0–17.0)
Potassium: 3.6 mmol/L (ref 3.5–5.1)
Sodium: 138 mmol/L (ref 135–145)
TCO2: 26 mmol/L (ref 22–32)

## 2020-04-22 LAB — RESPIRATORY PANEL BY RT PCR (FLU A&B, COVID)
Influenza A by PCR: NEGATIVE
Influenza B by PCR: NEGATIVE
SARS Coronavirus 2 by RT PCR: NEGATIVE

## 2020-04-22 LAB — TROPONIN I (HIGH SENSITIVITY)
Troponin I (High Sensitivity): 24 ng/L — ABNORMAL HIGH (ref <18)
Troponin I (High Sensitivity): 22 ng/L — ABNORMAL HIGH (ref <18)

## 2020-04-22 LAB — ABO/RH: ABO/RH(D): O POS

## 2020-04-22 LAB — PREPARE RBC (CROSSMATCH)

## 2020-04-22 SURGERY — REPAIR, AORTIC VALVE
Anesthesia: General | Site: Chest

## 2020-04-22 MED ORDER — PROPOFOL 10 MG/ML IV BOLUS
INTRAVENOUS | Status: AC
  Filled 2020-04-22: qty 20

## 2020-04-22 MED ORDER — SODIUM CHLORIDE 0.9 % IV SOLN
1.5000 g | INTRAVENOUS | Status: AC
  Administered 2020-04-22: 1.5 g via INTRAVENOUS
  Administered 2020-04-23: 750 mg via INTRAVENOUS
  Filled 2020-04-22: qty 1.5

## 2020-04-22 MED ORDER — METHYLPREDNISOLONE SODIUM SUCC 125 MG IJ SOLR
125.0000 mg | Freq: Once | INTRAMUSCULAR | Status: AC
  Administered 2020-04-22: 125 mg via INTRAVENOUS
  Filled 2020-04-22: qty 2

## 2020-04-22 MED ORDER — HEMOSTATIC AGENTS (NO CHARGE) OPTIME
TOPICAL | Status: DC | PRN
  Administered 2020-04-22 – 2020-04-23 (×8): 1 via TOPICAL

## 2020-04-22 MED ORDER — POTASSIUM CHLORIDE 2 MEQ/ML IV SOLN
80.0000 meq | INTRAVENOUS | Status: DC
  Filled 2020-04-22: qty 40

## 2020-04-22 MED ORDER — PHENYLEPHRINE HCL-NACL 20-0.9 MG/250ML-% IV SOLN
INTRAVENOUS | Status: DC | PRN
  Administered 2020-04-22: 25 ug/min via INTRAVENOUS

## 2020-04-22 MED ORDER — THROMBIN (RECOMBINANT) 20000 UNITS EX SOLR
CUTANEOUS | Status: AC
  Filled 2020-04-22: qty 20000

## 2020-04-22 MED ORDER — MILRINONE LACTATE IN DEXTROSE 20-5 MG/100ML-% IV SOLN
0.3000 ug/kg/min | INTRAVENOUS | Status: DC
  Filled 2020-04-22: qty 100

## 2020-04-22 MED ORDER — FENTANYL CITRATE (PF) 250 MCG/5ML IJ SOLN
INTRAMUSCULAR | Status: DC | PRN
Start: 2020-04-22 — End: 2020-04-23
  Administered 2020-04-22: 50 ug via INTRAVENOUS
  Administered 2020-04-22: 100 ug via INTRAVENOUS
  Administered 2020-04-22: 200 ug via INTRAVENOUS
  Administered 2020-04-22 (×2): 100 ug via INTRAVENOUS
  Administered 2020-04-22: 250 ug via INTRAVENOUS
  Administered 2020-04-22 (×2): 50 ug via INTRAVENOUS
  Administered 2020-04-22: 100 ug via INTRAVENOUS
  Administered 2020-04-22: 250 ug via INTRAVENOUS
  Administered 2020-04-22: 100 ug via INTRAVENOUS
  Administered 2020-04-22: 150 ug via INTRAVENOUS
  Administered 2020-04-23: 50 ug via INTRAVENOUS
  Administered 2020-04-23 (×2): 100 ug via INTRAVENOUS
  Administered 2020-04-23 (×2): 50 ug via INTRAVENOUS
  Administered 2020-04-23 (×2): 100 ug via INTRAVENOUS
  Administered 2020-04-23: 50 ug via INTRAVENOUS
  Administered 2020-04-23 (×2): 100 ug via INTRAVENOUS
  Administered 2020-04-23 (×2): 50 ug via INTRAVENOUS
  Administered 2020-04-23: 100 ug via INTRAVENOUS

## 2020-04-22 MED ORDER — BENAZEPRIL HCL 20 MG PO TABS
20.0000 mg | ORAL_TABLET | Freq: Once | ORAL | Status: AC
  Administered 2020-04-22: 20 mg via ORAL
  Filled 2020-04-22: qty 1

## 2020-04-22 MED ORDER — SODIUM CHLORIDE 0.9 % IV SOLN
750.0000 mg | INTRAVENOUS | Status: DC
  Filled 2020-04-22: qty 750

## 2020-04-22 MED ORDER — INSULIN REGULAR(HUMAN) IN NACL 100-0.9 UT/100ML-% IV SOLN
INTRAVENOUS | Status: AC
  Administered 2020-04-22: 1.5 [IU]/h via INTRAVENOUS
  Filled 2020-04-22: qty 100

## 2020-04-22 MED ORDER — NITROGLYCERIN IN D5W 200-5 MCG/ML-% IV SOLN
2.0000 ug/min | INTRAVENOUS | Status: AC
  Administered 2020-04-22: 5 ug/min via INTRAVENOUS
  Filled 2020-04-22: qty 250

## 2020-04-22 MED ORDER — TRANEXAMIC ACID 1000 MG/10ML IV SOLN
1.5000 mg/kg/h | INTRAVENOUS | Status: AC
  Administered 2020-04-22 – 2020-04-23 (×2): 1.5 mg/kg/h via INTRAVENOUS
  Filled 2020-04-22: qty 25

## 2020-04-22 MED ORDER — PHENYLEPHRINE 40 MCG/ML (10ML) SYRINGE FOR IV PUSH (FOR BLOOD PRESSURE SUPPORT)
PREFILLED_SYRINGE | INTRAVENOUS | Status: DC | PRN
  Administered 2020-04-22 (×9): 80 ug via INTRAVENOUS

## 2020-04-22 MED ORDER — TRANEXAMIC ACID (OHS) PUMP PRIME SOLUTION
2.0000 mg/kg | INTRAVENOUS | Status: DC
  Filled 2020-04-22: qty 2.49

## 2020-04-22 MED ORDER — MIDAZOLAM HCL (PF) 10 MG/2ML IJ SOLN
INTRAMUSCULAR | Status: AC
  Filled 2020-04-22: qty 2

## 2020-04-22 MED ORDER — HEPARIN SODIUM (PORCINE) 1000 UNIT/ML IJ SOLN
INTRAMUSCULAR | Status: DC | PRN
  Administered 2020-04-22: 44000 [IU] via INTRAVENOUS

## 2020-04-22 MED ORDER — FENTANYL CITRATE (PF) 250 MCG/5ML IJ SOLN
INTRAMUSCULAR | Status: AC
  Filled 2020-04-22: qty 20

## 2020-04-22 MED ORDER — SODIUM CHLORIDE 0.9 % IV SOLN
INTRAVENOUS | Status: DC
  Filled 2020-04-22: qty 30

## 2020-04-22 MED ORDER — THROMBIN 20000 UNITS EX SOLR
CUTANEOUS | Status: DC | PRN
  Administered 2020-04-22: 20000 [IU] via TOPICAL

## 2020-04-22 MED ORDER — IOHEXOL 350 MG/ML SOLN
75.0000 mL | Freq: Once | INTRAVENOUS | Status: AC | PRN
  Administered 2020-04-22: 75 mL via INTRAVENOUS

## 2020-04-22 MED ORDER — PROPOFOL 10 MG/ML IV BOLUS
INTRAVENOUS | Status: DC | PRN
  Administered 2020-04-22: 100 mg via INTRAVENOUS
  Administered 2020-04-22: 50 mg via INTRAVENOUS
  Administered 2020-04-22 (×2): 100 mg via INTRAVENOUS
  Administered 2020-04-22: 30 mg via INTRAVENOUS

## 2020-04-22 MED ORDER — LIDOCAINE 2% (20 MG/ML) 5 ML SYRINGE
INTRAMUSCULAR | Status: DC | PRN
  Administered 2020-04-22: 100 mg via INTRAVENOUS

## 2020-04-22 MED ORDER — TRANEXAMIC ACID (OHS) BOLUS VIA INFUSION
15.0000 mg/kg | INTRAVENOUS | Status: AC
  Administered 2020-04-22: 1870.5 mg via INTRAVENOUS
  Filled 2020-04-22: qty 1871

## 2020-04-22 MED ORDER — LACTATED RINGERS IV SOLN: INTRAVENOUS | Status: DC | PRN

## 2020-04-22 MED ORDER — 0.9 % SODIUM CHLORIDE (POUR BTL) OPTIME
TOPICAL | Status: DC | PRN
  Administered 2020-04-22: 5000 mL

## 2020-04-22 MED ORDER — TRANEXAMIC ACID 1000 MG/10ML IV SOLN
1.5000 mg/kg/h | INTRAVENOUS | Status: DC
  Filled 2020-04-22: qty 25

## 2020-04-22 MED ORDER — FENTANYL CITRATE (PF) 250 MCG/5ML IJ SOLN
INTRAMUSCULAR | Status: AC
  Filled 2020-04-22: qty 10

## 2020-04-22 MED ORDER — EPINEPHRINE HCL 5 MG/250ML IV SOLN IN NS
0.0000 ug/min | INTRAVENOUS | Status: DC
  Filled 2020-04-22: qty 250

## 2020-04-22 MED ORDER — ONDANSETRON HCL 4 MG/2ML IJ SOLN
4.0000 mg | Freq: Once | INTRAMUSCULAR | Status: AC
  Administered 2020-04-22: 4 mg via INTRAVENOUS
  Filled 2020-04-22: qty 2

## 2020-04-22 MED ORDER — MAGNESIUM SULFATE 50 % IJ SOLN
40.0000 meq | INTRAMUSCULAR | Status: DC
  Filled 2020-04-22: qty 9.85

## 2020-04-22 MED ORDER — NICARDIPINE HCL IN NACL 20-0.86 MG/200ML-% IV SOLN
3.0000 mg/h | INTRAVENOUS | Status: DC
  Administered 2020-04-22: 5 mg/h via INTRAVENOUS
  Filled 2020-04-22: qty 200

## 2020-04-22 MED ORDER — HYDROMORPHONE HCL 1 MG/ML IJ SOLN
1.0000 mg | Freq: Once | INTRAMUSCULAR | Status: AC
  Administered 2020-04-22: 1 mg via INTRAVENOUS
  Filled 2020-04-22: qty 1

## 2020-04-22 MED ORDER — ROCURONIUM BROMIDE 10 MG/ML (PF) SYRINGE
PREFILLED_SYRINGE | INTRAVENOUS | Status: DC | PRN
  Administered 2020-04-22 (×2): 50 mg via INTRAVENOUS
  Administered 2020-04-22: 100 mg via INTRAVENOUS
  Administered 2020-04-23: 50 mg via INTRAVENOUS

## 2020-04-22 MED ORDER — DEXMEDETOMIDINE HCL IN NACL 400 MCG/100ML IV SOLN
0.1000 ug/kg/h | INTRAVENOUS | Status: AC
  Administered 2020-04-23: 1 ug/kg/h via INTRAVENOUS
  Administered 2020-04-23: .7 ug/kg/h via INTRAVENOUS
  Filled 2020-04-22: qty 100

## 2020-04-22 MED ORDER — PHENYLEPHRINE HCL-NACL 20-0.9 MG/250ML-% IV SOLN
30.0000 ug/min | INTRAVENOUS | Status: DC
  Filled 2020-04-22: qty 250

## 2020-04-22 MED ORDER — ARTIFICIAL TEARS OPHTHALMIC OINT
TOPICAL_OINTMENT | OPHTHALMIC | Status: DC | PRN
  Administered 2020-04-22: 1 via OPHTHALMIC

## 2020-04-22 MED ORDER — MIDAZOLAM HCL 5 MG/5ML IJ SOLN
INTRAMUSCULAR | Status: DC | PRN
  Administered 2020-04-22: 1 mg via INTRAVENOUS
  Administered 2020-04-22: 4 mg via INTRAVENOUS
  Administered 2020-04-23: 2 mg via INTRAVENOUS
  Administered 2020-04-23: 3 mg via INTRAVENOUS

## 2020-04-22 MED ORDER — PLASMA-LYTE 148 IV SOLN
INTRAVENOUS | Status: AC
  Administered 2020-04-22: 500 mL
  Filled 2020-04-22: qty 2.5

## 2020-04-22 MED ORDER — VANCOMYCIN HCL 1500 MG/300ML IV SOLN
1500.0000 mg | INTRAVENOUS | Status: AC
  Administered 2020-04-22: 1500 mg via INTRAVENOUS
  Filled 2020-04-22: qty 300

## 2020-04-22 MED ORDER — NOREPINEPHRINE 4 MG/250ML-% IV SOLN
0.0000 ug/min | INTRAVENOUS | Status: DC
  Filled 2020-04-22: qty 250

## 2020-04-22 MED ORDER — AMLODIPINE BESYLATE 5 MG PO TABS
5.0000 mg | ORAL_TABLET | Freq: Once | ORAL | Status: AC
  Administered 2020-04-22: 5 mg via ORAL
  Filled 2020-04-22: qty 1

## 2020-04-22 MED ORDER — MORPHINE SULFATE (PF) 4 MG/ML IV SOLN
4.0000 mg | Freq: Once | INTRAVENOUS | Status: AC
  Administered 2020-04-22: 4 mg via INTRAVENOUS
  Filled 2020-04-22: qty 1

## 2020-04-22 MED ORDER — SUCCINYLCHOLINE CHLORIDE 200 MG/10ML IV SOSY
PREFILLED_SYRINGE | INTRAVENOUS | Status: DC | PRN
  Administered 2020-04-22: 200 mg via INTRAVENOUS

## 2020-04-22 SURGICAL SUPPLY — 97 items
ADAPTER CARDIO PERF ANTE/RETRO (ADAPTER) ×2 IMPLANT
BAG DECANTER FOR FLEXI CONT (MISCELLANEOUS) ×2 IMPLANT
BLADE CLIPPER SURG (BLADE) ×2 IMPLANT
BLADE STERNUM SYSTEM 6 (BLADE) ×2 IMPLANT
BLADE SURG 15 STRL LF DISP TIS (BLADE) ×1 IMPLANT
BLADE SURG 15 STRL SS (BLADE) ×2
CANISTER SUCT 3000ML PPV (MISCELLANEOUS) ×2 IMPLANT
CANNULA GUNDRY RCSP 15FR (MISCELLANEOUS) ×4 IMPLANT
CANNULA SUMP PERICARDIAL (CANNULA) ×2 IMPLANT
CATH HEART VENT LEFT (CATHETERS) ×2 IMPLANT
CATH ROBINSON RED A/P 18FR (CATHETERS) ×8 IMPLANT
CATH THORACIC 28FR (CATHETERS) ×2 IMPLANT
CATH THORACIC 36FR (CATHETERS) ×2 IMPLANT
CATH THORACIC 36FR RT ANG (CATHETERS) ×2 IMPLANT
CNTNR URN SCR LID CUP LEK RST (MISCELLANEOUS) ×2 IMPLANT
CONN ST 1/4X3/8  BEN (MISCELLANEOUS) ×2
CONN ST 1/4X3/8 BEN (MISCELLANEOUS) ×1 IMPLANT
CONT SPEC 4OZ STRL OR WHT (MISCELLANEOUS) ×4
COVER SURGICAL LIGHT HANDLE (MISCELLANEOUS) IMPLANT
DRAPE INCISE 23X17 IOBAN STRL (DRAPES) ×1
DRAPE INCISE IOBAN 23X17 STRL (DRAPES) ×1 IMPLANT
DRAPE SLUSH/WARMER DISC (DRAPES) ×2 IMPLANT
DRSG COVADERM 4X14 (GAUZE/BANDAGES/DRESSINGS) ×2 IMPLANT
ELECT CAUTERY BLADE 6.4 (BLADE) ×2 IMPLANT
ELECT REM PT RETURN 9FT ADLT (ELECTROSURGICAL) ×4
ELECTRODE REM PT RTRN 9FT ADLT (ELECTROSURGICAL) ×2 IMPLANT
FELT TEFLON 1X6 (MISCELLANEOUS) ×4 IMPLANT
GAUZE SPONGE 4X4 12PLY STRL (GAUZE/BANDAGES/DRESSINGS) ×4 IMPLANT
GAUZE SPONGE 4X4 12PLY STRL LF (GAUZE/BANDAGES/DRESSINGS) ×4 IMPLANT
GLOVE BIO SURGEON STRL SZ 6 (GLOVE) ×4 IMPLANT
GLOVE BIO SURGEON STRL SZ 6.5 (GLOVE) ×4 IMPLANT
GLOVE BIO SURGEON STRL SZ7 (GLOVE) IMPLANT
GLOVE BIO SURGEON STRL SZ7.5 (GLOVE) IMPLANT
GLOVE BIOGEL PI IND STRL 7.5 (GLOVE) ×2 IMPLANT
GLOVE BIOGEL PI IND STRL 8.5 (GLOVE) ×1 IMPLANT
GLOVE BIOGEL PI IND STRL 9 (GLOVE) ×1 IMPLANT
GLOVE BIOGEL PI INDICATOR 7.5 (GLOVE) ×2
GLOVE BIOGEL PI INDICATOR 8.5 (GLOVE) ×1
GLOVE BIOGEL PI INDICATOR 9 (GLOVE) ×1
GLOVE SURG SS PI 7.5 STRL IVOR (GLOVE) ×2 IMPLANT
GLOVE TRIUMPH SURG SIZE 7.0 (KITS) ×4 IMPLANT
GOWN STRL REUS W/ TWL LRG LVL3 (GOWN DISPOSABLE) ×6 IMPLANT
GOWN STRL REUS W/ TWL XL LVL3 (GOWN DISPOSABLE) ×1 IMPLANT
GOWN STRL REUS W/TWL LRG LVL3 (GOWN DISPOSABLE) ×12
GOWN STRL REUS W/TWL XL LVL3 (GOWN DISPOSABLE) ×2
GRAFT CV 30X8WVN NDL (Graft) ×1 IMPLANT
GRAFT HEMASHIELD 8MM (Graft) ×2 IMPLANT
GRAFT WOVEN D/V 30DX30L (Vascular Products) ×2 IMPLANT
HEMOSTAT POWDER SURGIFOAM 1G (HEMOSTASIS) ×6 IMPLANT
HEMOSTAT SURGICEL 2X14 (HEMOSTASIS) ×6 IMPLANT
INSERT FOGARTY SM (MISCELLANEOUS) ×2 IMPLANT
KIT BASIN OR (CUSTOM PROCEDURE TRAY) ×2 IMPLANT
KIT CATH CPB BARTLE (MISCELLANEOUS) ×2 IMPLANT
KIT SUCTION CATH 14FR (SUCTIONS) ×2 IMPLANT
KIT TURNOVER KIT B (KITS) ×2 IMPLANT
LINE VENT (MISCELLANEOUS) ×2 IMPLANT
LOOP VESSEL MAXI BLUE (MISCELLANEOUS) ×2 IMPLANT
LOOP VESSEL SUPERMAXI WHITE (MISCELLANEOUS) ×2 IMPLANT
NS IRRIG 1000ML POUR BTL (IV SOLUTION) ×12 IMPLANT
PACK E OPEN HEART (SUTURE) ×2 IMPLANT
PACK OPEN HEART (CUSTOM PROCEDURE TRAY) ×2 IMPLANT
PAD ARMBOARD 7.5X6 YLW CONV (MISCELLANEOUS) ×4 IMPLANT
POSITIONER HEAD DONUT 9IN (MISCELLANEOUS) ×2 IMPLANT
SEALANT SURG COSEAL 8ML (VASCULAR PRODUCTS) ×2 IMPLANT
SET CARDIOPLEGIA MPS 5001102 (MISCELLANEOUS) ×2 IMPLANT
SPONGE LAP 18X18 RF (DISPOSABLE) ×4 IMPLANT
SPONGE LAP 4X18 RFD (DISPOSABLE) ×6 IMPLANT
SUT BONE WAX W31G (SUTURE) ×2 IMPLANT
SUT ETHIBON 2 0 V 52N 30 (SUTURE) IMPLANT
SUT ETHIBOND 2 0 SH (SUTURE) ×4
SUT ETHIBOND 2 0 SH 36X2 (SUTURE) ×2 IMPLANT
SUT PROLENE 3 0 SH DA (SUTURE) ×2 IMPLANT
SUT PROLENE 3 0 SH1 36 (SUTURE) ×2 IMPLANT
SUT PROLENE 4 0 RB 1 (SUTURE) ×24
SUT PROLENE 4-0 RB1 .5 CRCL 36 (SUTURE) ×12 IMPLANT
SUT PROLENE 5 0 C 1 36 (SUTURE) ×2 IMPLANT
SUT SILK 2 0 SH CR/8 (SUTURE) ×4 IMPLANT
SUT SILK 3 0 (SUTURE) ×4
SUT SILK 3-0 18XBRD TIE 12 (SUTURE) ×2 IMPLANT
SUT STEEL 6MS V (SUTURE) IMPLANT
SUT VIC AB 1 CTX 36 (SUTURE) ×6
SUT VIC AB 1 CTX36XBRD ANBCTR (SUTURE) ×3 IMPLANT
SUT VIC AB 2-0 CT1 27 (SUTURE) ×4
SUT VIC AB 2-0 CT1 TAPERPNT 27 (SUTURE) ×2 IMPLANT
SUT VIC AB 3-0 X1 27 (SUTURE) ×2 IMPLANT
SYR 10ML KIT SKIN ADHESIVE (MISCELLANEOUS) ×2 IMPLANT
SYR BULB IRRIG 60ML STRL (SYRINGE) ×2 IMPLANT
SYSTEM SAHARA CHEST DRAIN ATS (WOUND CARE) ×2 IMPLANT
TAPE CLOTH SURG 4X10 WHT LF (GAUZE/BANDAGES/DRESSINGS) ×4 IMPLANT
TAPE PAPER 2X10 WHT MICROPORE (GAUZE/BANDAGES/DRESSINGS) ×2 IMPLANT
TOWEL GREEN STERILE (TOWEL DISPOSABLE) ×2 IMPLANT
TOWEL GREEN STERILE FF (TOWEL DISPOSABLE) ×2 IMPLANT
TRAY FOLEY SLVR 16FR TEMP STAT (SET/KITS/TRAYS/PACK) ×2 IMPLANT
UNDERPAD 30X36 HEAVY ABSORB (UNDERPADS AND DIAPERS) ×2 IMPLANT
VENT LEFT HEART 12002 (CATHETERS) ×4
WATER STERILE IRR 1000ML POUR (IV SOLUTION) ×4 IMPLANT
YANKAUER SUCT BULB TIP NO VENT (SUCTIONS) ×2 IMPLANT

## 2020-04-22 NOTE — Anesthesia Procedure Notes (Signed)
Procedure Name: Intubation Date/Time: 04/22/2020 8:19 PM Performed by: Lillia Abed, MD Pre-anesthesia Checklist: Patient identified, Emergency Drugs available, Suction available and Patient being monitored Patient Re-evaluated:Patient Re-evaluated prior to induction Oxygen Delivery Method: Circle System Utilized Preoxygenation: Pre-oxygenation with 100% oxygen Induction Type: IV induction, Rapid sequence and Cricoid Pressure applied Laryngoscope Size: Glidescope and 4 Grade View: Grade II Tube type: Oral Tube size: 8.0 mm Number of attempts: 3 Airway Equipment and Method: Stylet,  Bougie stylet and Video-laryngoscopy Placement Confirmation: ETT inserted through vocal cords under direct vision,  positive ETCO2 and breath sounds checked- equal and bilateral Secured at: 24 cm Tube secured with: Tape Dental Injury: Teeth and Oropharynx as per pre-operative assessment  Difficulty Due To: Difficult Airway- due to limited oral opening, Difficult Airway- due to large tongue and Difficulty was anticipated Comments: Attempt x 1 via this CRNA, unable to advance ETT in mouth due to limited mouth opening, Ossey MD intubated x 1; after intubation noted ETT cuff rupture post ventilations, Glidescope 4 utilized via Ossey MD again to visualize vocal cords, bougie utilized to exchange ETTs, noted bloody drainage (75 ml) in suction canister post intubation via oral suctioning, Ossey MD aware. [name] informed of oral bleeding post intubation.

## 2020-04-22 NOTE — ED Triage Notes (Signed)
Pt reports reports CP,lower back pain and Rt leg numbness. Pt reports several months age he had a steroid injection  For back pain. The back pain was less for several months. 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 Pt feels like this new job has cause his pain at this time. Pt ambulatory to room with difficulty bearing weight on RT leg.

## 2020-04-22 NOTE — Anesthesia Procedure Notes (Signed)
Arterial Line Insertion Start/End9/29/2021 7:45 PM, 04/22/2020 7:50 PM Performed by: Babs Bertin, CRNA, CRNA  Patient location: OR. Emergency situation Lidocaine 1% used for infiltration and patient sedated Left, radial was placed Catheter size: 20 G Hand hygiene performed  and Seldinger technique used Allen's test indicative of satisfactory collateral circulation Attempts: 1 Procedure performed without using ultrasound guided technique. Following insertion, dressing applied and Biopatch. Post procedure assessment: normal  Patient tolerated the procedure well with no immediate complications.

## 2020-04-22 NOTE — ED Triage Notes (Signed)
Patient complains of ongoing lower back, chest and right leg pain. Here yesterday for same and left without being seen . Patient went to UC today and sent back to ED. Patient alert and oriented, no change in doscomfort

## 2020-04-22 NOTE — Consult Note (Signed)
[address]       [place] [address]             [phone]      Cardiothoracic Surgery Consultation  Reason for Consult: Acute type A aortic dissection Referring Physician: Dr. Jasmine Pang Kurtenbach is an 44 y.o. male.  HPI:  The patient is a 44 year old gentleman with a history of hypertension and smoking who developed sudden onset of chest and back pain yesterday afternoon.  Shortly after this pain started he developed pain going down his right leg that waxed and waned.  He presented to the emergency room and had some lab work done.  He left AGAINST MEDICAL ADVICE before any further work-up could be done.  He said that he had continued pain in his chest back and right leg at home and presented back to the emergency room this morning.  He had a CTA of the chest, abdomen, and pelvis late this afternoon which showed an acute type a aortic dissection beginning at about the level of the sinotubular junction and extending around the aorta down to the left common iliac artery.  The dissection flap enters the superior mesenteric artery with partial occlusion and reconstitution distally.  The celiac trunk and IMA are patent.  There is mild dilatation of the ascending aorta at about 4 cm.  The aortic root appears to be of normal size.  I was not called to see the patient ~6:30 PM.  The patient reports that he has had pain in his back and down into his right leg previously which she thought was sciatica.  He has had a steroid injection in his back several months ago.  He said the pain in his right leg has been going up and down the leg and fluctuating.  He has had some numbness and tingling.  Past Medical History:  Diagnosis Date  . Essential hypertension     Past Surgical History:  Procedure Laterality Date  . CHOLECYSTECTOMY    . HAND TENDON SURGERY Right     Family History  Problem Relation Age of Onset  . Hypertension Mother   . Hypertension Father   . Cancer  Maternal Grandmother   . Cancer Maternal Grandfather   . Heart attack Neg Hx   . Heart failure Neg Hx     Social History:  reports that he has been smoking cigarettes. He has been smoking about 0.50 packs per day. He has never used smokeless tobacco. He reports that he does not drink alcohol and does not use drugs.  Allergies:  Allergies  Allergen Reactions  . Shellfish Allergy Hives and Rash    Medications:  I have reviewed the patient's current medications. Prior to Admission: (Not in a hospital admission)  Scheduled:  Continuous: . niCARDipine 10 mg/hr (04/22/20 1847)   PRN: Anti-infectives (From admission, onward)   None      Results for orders placed or performed during the hospital encounter of 04/22/20 (from the past 48 hour(s))  Troponin I (High Sensitivity)     Status: Abnormal   Collection Time: 04/22/20 10:17 AM  Result Value Ref Range   Troponin I (High Sensitivity) 24 (H) <18 ng/L    Comment: (NOTE) Elevated high sensitivity troponin I (hsTnI) values and significant  changes across serial measurements may suggest ACS but many other  chronic and acute conditions are known to elevate hsTnI results.  Refer to the "Links" section for chest pain algorithms and additional  guidance. Performed at [facility], [place] [address], [place], [place] [address]   Troponin I (High Sensitivity)     Status: Abnormal   Collection Time: 04/22/20  3:05 PM  Result Value Ref Range   Troponin I (High Sensitivity) 22 (H) <18 ng/L    Comment: (NOTE) Elevated high sensitivity troponin I (hsTnI) values and significant  changes across serial measurements may suggest ACS but many other  chronic and acute conditions are known to elevate hsTnI results.  Refer to the "Links" section for chest pain algorithms and additional  guidance. Performed at [facility], [place] [address] [place], [place] [address]   Respiratory Panel by RT PCR (Flu A&B, Covid) - Nasopharyngeal Swab      Status: None   Collection Time: 04/22/20  4:13 PM   Specimen: Nasopharyngeal Swab  Result Value Ref Range   SARS Coronavirus 2 by RT PCR NEGATIVE NEGATIVE    Comment: (NOTE) SARS-CoV-2 target nucleic acids are NOT DETECTED.  The SARS-CoV-2 RNA is generally detectable in upper respiratoy specimens during the acute phase of infection. The lowest concentration of SARS-CoV-2 viral copies this assay can detect is 131 copies/mL. A negative result does not preclude SARS-Cov-2 infection and should not be used as the sole basis for treatment or other patient management decisions. A negative result may occur with  improper specimen collection/handling, submission of specimen other than nasopharyngeal swab, presence of viral mutation(s) within the areas targeted by this assay, and inadequate number of viral copies (<131 copies/mL). A negative result must be combined with clinical observations, patient history, and epidemiological information. The expected result is Negative.  Fact Sheet for Patients:  PinkCheek.be  Fact Sheet for Healthcare Providers:  GravelBags.it  This test is no t yet approved or cleared by the [place] FDA and  has been authorized for detection and/or diagnosis of SARS-CoV-2 by FDA under an Emergency Use Authorization (EUA). This EUA will remain  in effect (meaning this test can be used) for the duration of the COVID-19 declaration under Section 564(b)(1) of the Act, 21 U.S.C. section 360bbb-3(b)(1), unless the authorization is terminated or revoked sooner.     Influenza A by PCR NEGATIVE NEGATIVE   Influenza B by PCR NEGATIVE NEGATIVE    Comment: (NOTE) The Xpert Xpress SARS-CoV-2/FLU/RSV assay is intended as an aid in  the diagnosis of influenza from Nasopharyngeal swab specimens and  should not be used as a sole basis for treatment. Nasal washings and  aspirates are unacceptable for Xpert Xpress  SARS-CoV-2/FLU/RSV  testing.  Fact Sheet for Patients: PinkCheek.be  Fact Sheet for Healthcare Providers: GravelBags.it  This test is not yet approved or cleared by the [place] FDA and  has been authorized for detection and/or diagnosis of SARS-CoV-2 by  FDA under an Emergency Use Authorization (EUA). This EUA will remain  in effect (meaning this test can be used) for the duration of the  Covid-19 declaration under Section 564(b)(1) of the Act, 21  U.S.C. section 360bbb-3(b)(1), unless the authorization is  terminated or revoked. Performed at [facility], [place] [address] [place], [place] [address]   I-stat chem 8, ED (not at [facility] or [facility])     Status: Abnormal   Collection Time: 04/22/20  6:49 PM  Result Value Ref Range   Sodium 138 135 - 145 mmol/L   Potassium 3.6 3.5 - 5.1 mmol/L   Chloride 102 98 - 111 mmol/L   BUN 14 6 - 20 mg/dL  Creatinine, Ser 0.70 0.61 - 1.24 mg/dL   Glucose, Bld 96 70 - 99 mg/dL    Comment: Glucose reference range applies only to samples taken after fasting for at least 8 hours.   Calcium, Ion 1.05 (L) 1.15 - 1.40 mmol/L   TCO2 26 22 - 32 mmol/L   Hemoglobin 15.0 13.0 - 17.0 g/dL   HCT 44.0 39 - 52 %    DG Chest 2 View  Result Date: 04/21/2020 CLINICAL DATA:  chest pain EXAM: CHEST - 2 VIEW COMPARISON:  03/04/2018 FINDINGS: Stable cardiomegaly. Pulmonary vasculature is normal. Ill-defined opacity at the right lung base. No confluent consolidation, pleural effusion, or pneumothorax. No acute osseous abnormalities are seen. IMPRESSION: 1. Ill-defined right lung base opacity may be atelectasis or pneumonia. 2. Stable cardiomegaly. Electronically Signed   By: Keith Rake M.D.   On: 04/21/2020 15:45   CT Angio Chest/Abd/Pel for Dissection W and/or Wo Contrast  Result Date: 04/22/2020 CLINICAL DATA:  Acute back pain.  Aortic dissection is suspected. EXAM: CT ANGIOGRAPHY CHEST,  ABDOMEN AND PELVIS TECHNIQUE: Non-contrast CT of the chest was initially obtained. Multidetector CT imaging through the chest, abdomen and pelvis was performed using the standard protocol during bolus administration of intravenous contrast. Multiplanar reconstructed images and MIPs were obtained and reviewed to evaluate the vascular anatomy. CONTRAST:  62mL OMNIPAQUE IOHEXOL 350 MG/ML SOLN COMPARISON:  None. FINDINGS: CTA CHEST FINDINGS Cardiovascular: Noncontrast imaging demonstrates no intramural hematoma within the ascending, transverse descending thoracic aorta. Contrast enhanced imaging demonstrates aortic dissection flap originating in the proximal aorta just above the sinus of Valsalva. The dissection flap spirasl through the transverse arch and enters the descending thoracic aorta. The dissection flap extends into the abdominal aorta to the bifurcation ultimately extends into the LEFT common iliac artery. The ascending thoracic aorta is mildly aneurysmal at 3.8 cm. Dissection flap does not clearly enter the great vessels. Typical great vessel anatomy. No mediastinal hematoma. No pericardial fluid. Reconstitute distally. Mediastinum/Nodes: No mediastinal adenopathy Lungs/Pleura: .  No acute pulmonary findings. Musculoskeletal: No acute osseous abnormality. Review of the MIP images confirms the above findings. CTA ABDOMEN AND PELVIS FINDINGS VASCULAR Aorta: Dissection flap extends through the abdominal aorta. Celiac: Opacifies normally. SMA: Defect dissection flap enters the superior mesenteric artery with partial occlusion (image 160/series 6). The arteries appear to reconstitute distally. Renals: Single LEFT and RIGHT renal arteries opacify. The RIGHT renal artery appears opacified from the the true lumen with the LEFT opacified by the false lumen. IMA: Patent Inflow: Normal. Normal caliber of the iliac arteries. RIGHT iliac artery measures Veins: Normal Review of the MIP images confirms the above findings.  NON-VASCULAR Hepatobiliary: No focal hepatic lesion. Pancreas: Rounded lesion tail the pancreas measuring 2.8 cm. (Image 146/6). Lesion is similar density to the spleen. Spleen: Normal spleen Adrenals/Urinary Tract: Kidneys enhance symmetrically ureters and bladder normal Stomach/Bowel: . Stomach, small bowel, appendix, and cecum are normal. The colon and rectosigmoid colon are normal. Lymphatic: No adenopathy Reproductive: Prostate normal Other: None Musculoskeletal: No aggressive osseous lesion Review of the MIP images confirms the above findings. IMPRESSION: 1. 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 Recommend immediate vascular surgery consultation. 2. Dissection flap extends through the entire the aorta terminating in the LEFT common iliac artery. 3. Dissection flap enters the superior mesenteric artery with PARTIAL OCCLUSION. 4. No significant aneurysmal dilatation of the aorta. 5. Renal arteries opacify. 6. Celiac trunk and IMA are patent. 7. Rounded  lesion at the tail the pancreas either represents a primary pancreatic lesion large splenule. Recommend non emergent contrast MRI of the abdomen for further evaluation. Critical Value/emergent results were called by telephone at the time of interpretation on 04/22/2020 at 6:02 pm to provider [facility] , who verbally acknowledged these results. Electronically Signed   By: Suzy Bouchard M.D.   On: 04/22/2020 18:03    Review of Systems  Constitutional: Negative.   HENT: Negative.   Eyes: Negative.   Respiratory: Negative for shortness of breath.   Cardiovascular: Positive for chest pain. Negative for leg swelling.  Gastrointestinal: Positive for nausea.  Endocrine: Negative.   Genitourinary: Negative.   Musculoskeletal: Positive for back pain.  Neurological: Positive for numbness. Negative for dizziness and syncope.  Hematological: Negative.   Psychiatric/Behavioral: Negative.    Blood  pressure (!) 221/93, pulse 80, temperature 98.2 F (36.8 C), temperature source Oral, resp. rate 16, SpO2 98 %. Physical Exam Constitutional:      Appearance: He is obese.     Comments: Appears very uncomfortable  HENT:     Head: Normocephalic and atraumatic.  Eyes:     Extraocular Movements: Extraocular movements intact.     Pupils: Pupils are equal, round, and reactive to light.  Cardiovascular:     Rate and Rhythm: Normal rate and regular rhythm.     Heart sounds: Normal heart sounds. No murmur heard.      Comments: Femoral pulses are palpable Left dorsalis pedis and posterior tibial pulse are palpable.  Right dorsalis pedis and posterior tibial pulse are not palpable. Pulmonary:     Effort: Pulmonary effort is normal.     Breath sounds: Normal breath sounds.  Abdominal:     General: Bowel sounds are normal.     Comments: Obese and nontender  Musculoskeletal:        General: No swelling.     Cervical back: Normal range of motion and neck supple.  Skin:    General: Skin is warm and dry.     Comments: Both lower extremities are pink and warm  Neurological:     General: No focal deficit present.     Mental Status: He is oriented to person, place, and time.    CLINICAL DATA:  Acute back pain.  Aortic dissection is suspected.  EXAM: CT ANGIOGRAPHY CHEST, ABDOMEN AND PELVIS  TECHNIQUE: Non-contrast CT of the chest was initially obtained.  Multidetector CT imaging through the chest, abdomen and pelvis was performed using the standard protocol during bolus administration of intravenous contrast. Multiplanar reconstructed images and MIPs were obtained and reviewed to evaluate the vascular anatomy.  CONTRAST:  72mL OMNIPAQUE IOHEXOL 350 MG/ML SOLN  COMPARISON:  None.  FINDINGS: CTA CHEST FINDINGS  Cardiovascular: Noncontrast imaging demonstrates no intramural hematoma within the ascending, transverse descending thoracic aorta.  Contrast enhanced imaging  demonstrates aortic dissection flap originating in the proximal aorta just above the sinus of Valsalva. The dissection flap spirasl through the transverse arch and enters the descending thoracic aorta.  The dissection flap extends into the abdominal aorta to the bifurcation ultimately extends into the LEFT common iliac artery.  The ascending thoracic aorta is mildly aneurysmal at 3.8 cm.  Dissection flap does not clearly enter the great vessels. Typical great vessel anatomy.  No mediastinal hematoma. No pericardial fluid. Reconstitute distally.  Mediastinum/Nodes: No mediastinal adenopathy  Lungs/Pleura: .  No acute pulmonary findings.  Musculoskeletal: No acute osseous abnormality.  Review of the MIP images confirms the above  findings.  CTA ABDOMEN AND PELVIS FINDINGS  VASCULAR  Aorta: Dissection flap extends through the abdominal aorta.  Celiac: Opacifies normally.  SMA: Defect dissection flap enters the superior mesenteric artery with partial occlusion (image 160/series 6). The arteries appear to reconstitute distally.  Renals: Single LEFT and RIGHT renal arteries opacify. The RIGHT renal artery appears opacified from the the true lumen with the LEFT opacified by the false lumen.  IMA: Patent  Inflow: Normal. Normal caliber of the iliac arteries. RIGHT iliac artery measures  Veins: Normal  Review of the MIP images confirms the above findings.  NON-VASCULAR  Hepatobiliary: No focal hepatic lesion.  Pancreas: Rounded lesion tail the pancreas measuring 2.8 cm. (Image 146/6). Lesion is similar density to the spleen.  Spleen: Normal spleen  Adrenals/Urinary Tract: Kidneys enhance symmetrically ureters and bladder normal  Stomach/Bowel: . Stomach, small bowel, appendix, and cecum are normal. The colon and rectosigmoid colon are normal.  Lymphatic: No adenopathy  Reproductive: Prostate normal  Other: None  Musculoskeletal:  No aggressive osseous lesion  Review of the MIP images confirms the above findings.  IMPRESSION: 1. 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 Recommend immediate vascular surgery consultation. 2. Dissection flap extends through the entire the aorta terminating in the LEFT common iliac artery. 3. Dissection flap enters the superior mesenteric artery with PARTIAL OCCLUSION. 4. No significant aneurysmal dilatation of the aorta. 5. Renal arteries opacify. 6. Celiac trunk and IMA are patent. 7. Rounded lesion at the tail the pancreas either represents a primary pancreatic lesion large splenule. Recommend non emergent contrast MRI of the abdomen for further evaluation.  Critical Value/emergent results were called by telephone at the time of interpretation on 04/22/2020 at 6:02 pm to provider [facility] , who verbally acknowledged these results.   Electronically Signed   By: Suzy Bouchard M.D.   On: 04/22/2020 18:03  Assessment/Plan:  This 44 year old gentleman has an acute type A aortic dissection probably starting yesterday afternoon.  He has remained severely hypertensive in the emergency room throughout the day today.  He has had a lot of pain and numbness in the right lower extremity although it is pink and warm and there is a palpable right femoral pulse.  This pain seems to be fluctuating and I am concerned that it may be related to his aortic dissection.  His aortic dissection will require emergent repair this evening.  We will then reevaluate his lower extremity perfusion after repair of the dissection and decide if any further intervention is needed. I discussed the operative procedure with the patient and family including alternatives, benefits and risks; including but not limited to bleeding, blood transfusion, infection, stroke, myocardial infarction, heart block requiring a permanent pacemaker, organ dysfunction,  and death.  Magdalene Patricia understands and agrees to proceed.   I spent 60 minutes performing this consultation and > 50% of this time was spent face to face counseling and coordinating the care of this patient's acute type A aortic dissection.  Fernande Boyden Laketha Leopard 04/22/2020, 7:01 PM

## 2020-04-22 NOTE — Anesthesia Procedure Notes (Signed)
Arterial Line Insertion Start/End9/29/2021 7:50 PM, 04/22/2020 7:59 PM Performed by: Babs Bertin, CRNA, CRNA  Patient location: OR. Emergency situation Patient sedated Right, radial was placed Catheter size: 20 G Hand hygiene performed  and Seldinger technique used Allen's test indicative of satisfactory collateral circulation Attempts: 4 Procedure performed without using ultrasound guided technique. Following insertion, dressing applied and Biopatch. Post procedure assessment: normal  Patient tolerated the procedure well with no immediate complications.

## 2020-04-22 NOTE — ED Notes (Signed)
EMS at bed side for transport to ED

## 2020-04-22 NOTE — ED Notes (Signed)
EMS called

## 2020-04-22 NOTE — Anesthesia Preprocedure Evaluation (Signed)
Anesthesia Evaluation  Patient identified by MRN, date of birth, ID band Patient awake    Reviewed: Allergy & Precautions, NPO status , Patient's Chart, lab work & pertinent test results  Airway Mallampati: II  TM Distance: >3 FB Neck ROM: Full    Dental   Pulmonary Current Smoker,    Pulmonary exam normal        Cardiovascular hypertension, Pt. on medications Normal cardiovascular exam     Neuro/Psych    GI/Hepatic   Endo/Other    Renal/GU      Musculoskeletal   Abdominal   Peds  Hematology   Anesthesia Other Findings   Reproductive/Obstetrics                             Anesthesia Physical Anesthesia Plan  ASA: III and emergent  Anesthesia Plan: General   Post-op Pain Management:    Induction: Intravenous  PONV Risk Score and Plan: Treatment may vary due to age or medical condition  Airway Management Planned: Oral ETT  Additional Equipment: Arterial line, PA Cath, TEE and Ultrasound Guidance Line Placement  Intra-op Plan:   Post-operative Plan: Post-operative intubation/ventilation  Informed Consent: I have reviewed the patients History and Physical, chart, labs and discussed the procedure including the risks, benefits and alternatives for the proposed anesthesia with the patient or authorized representative who has indicated his/her understanding and acceptance.       Plan Discussed with: CRNA and Surgeon  Anesthesia Plan Comments:         Anesthesia Quick Evaluation

## 2020-04-22 NOTE — ED Provider Notes (Signed)
[facility] EMERGENCY DEPARTMENT Provider Note   CSN: [id number] Arrival date & time: 04/22/20  0941     History No chief complaint on file.   Richard Patterson is a 44 y.o. male.  HPI      Richard Patterson is a 44 y.o. male, with a history of HTN, tobacco use, presenting to the ED with chest pain beginning yesterday around 11 AM. Patient states he began having left-sided chest pain that came on suddenly while he was washing his hands after using the restroom.  Pain is to the left chest, feels like a tightness and stabbing, radiating to the back and into the left abdomen, constant with only fluctuation is in intensity between 8/10-10/10.  Accompanied by vomiting and diarrhea shortly after onset.  He also began having pain in the right lower back extending down the back of his right leg.  This was accompanied by tingling.  He has had pain in this pattern before that was suspected to be sciatica. Denies history of DVT/PE, recent travel, recent surgery, recent trauma. Denies syncope, numbness, weakness, urinary symptoms, hematochezia/melena, lower extremity edema/pain, or any other complaints.  Past Medical History:  Diagnosis Date  . Essential hypertension     Patient Active Problem List   Diagnosis Date Noted  . Polycythemia 01/22/2019  . Hypertensive urgency 05/31/2016  . Chest pain 05/31/2016  . Essential hypertension 05/31/2016    Past Surgical History:  Procedure Laterality Date  . CHOLECYSTECTOMY    . HAND TENDON SURGERY Right        Family History  Problem Relation Age of Onset  . Hypertension Mother   . Hypertension Father   . Cancer Maternal Grandmother   . Cancer Maternal Grandfather   . Heart attack Neg Hx   . Heart failure Neg Hx     Social History   Tobacco Use  . Smoking status: Current Every Day Smoker    Packs/day: 0.50    Types: Cigarettes  . Smokeless tobacco: Never Used  Substance Use Topics  . Alcohol use: No  . Drug use:  No    Home Medications Prior to Admission medications   Medication Sig Start Date End Date Taking? Authorizing Provider  amLODipine-benazepril (LOTREL) 5-20 MG capsule Take 1 capsule by mouth daily. 06/17/19   Martinique, Betty G, MD  diphenhydrAMINE (BENADRYL) 25 mg capsule Take 1 capsule (25 mg total) by mouth every 6 (six) hours as needed. With  reglan for headache. 06/01/16 04/22/20  Charlesetta Shanks, MD  gabapentin (NEURONTIN) 300 MG capsule Take 1 capsule (300 mg total) by mouth at bedtime. 12/11/19 04/22/20  Martin[place], Betty G, MD    Allergies    Shellfish allergy  Review of Systems   Review of Systems  Constitutional: Negative for chills, diaphoresis and fever.  Respiratory: Positive for shortness of breath. Negative for cough.   Cardiovascular: Positive for chest pain. Negative for leg swelling.  Gastrointestinal: Positive for abdominal pain, diarrhea, nausea and vomiting. Negative for blood in stool.  Musculoskeletal: Positive for back pain.  Neurological: Negative for weakness and numbness.  All other systems reviewed and are negative.   Physical Exam Updated Vital Signs BP (!) 221/90 (BP Location: Right Arm)   Pulse 69   Temp 98.2 F (36.8 C) (Oral)   Resp 20   SpO2 97%   Physical Exam Vitals and nursing note reviewed.  Constitutional:      General: He is in acute distress (appears uncomfortable).     Appearance: He  is well-developed. He is obese. He is not diaphoretic.  HENT:     Head: Normocephalic and atraumatic.     Mouth/Throat:     Mouth: Mucous membranes are moist.     Pharynx: Oropharynx is clear.  Eyes:     Conjunctiva/sclera: Conjunctivae normal.  Cardiovascular:     Rate and Rhythm: Normal rate and regular rhythm.     Pulses:          Radial pulses are 2+ on the right side and 2+ on the left side.       Dorsalis pedis pulses are detected w/ Doppler on the right side and detected w/ Doppler on the left side.       Posterior tibial pulses are detected w/  Doppler on the right side and detected w/ Doppler on the left side.     Heart sounds: Normal heart sounds.     Comments: Tactile temperature in the extremities appropriate and equal bilaterally. Pulmonary:     Effort: Pulmonary effort is normal. No respiratory distress.     Breath sounds: Normal breath sounds.  Abdominal:     Palpations: Abdomen is soft.     Tenderness: There is generalized abdominal tenderness. There is no guarding.     Comments: Pain is worse on the left.  Musculoskeletal:     Cervical back: Neck supple.       Back:     Right lower leg: No edema.     Left lower leg: No edema.  Lymphadenopathy:     Cervical: No cervical adenopathy.  Skin:    General: Skin is warm and dry.  Neurological:     Mental Status: He is alert.     Comments: No noted acute cognitive deficit. Sensation grossly intact to light touch in the extremities.   Grip strengths equal bilaterally.   Strength 5/5 in bilateral upper extremities and left lower extremity.  Strength 4/5 in the right lower extremity, however, patient states this is due to pain in his back when he raises his right leg off the bed. Coordination intact.  Cranial nerves III-XII grossly intact.  Handles oral secretions without noted difficulty.  No noted phonation or speech deficit. No facial droop.   Psychiatric:        Mood and Affect: Mood and affect normal.        Speech: Speech normal.        Behavior: Behavior normal.     ED Results / Procedures / Treatments   Labs (all labs ordered are listed, but only abnormal results are displayed) Labs Reviewed  I-STAT CHEM 8, ED - Abnormal; Notable for the following components:      Result Value   Calcium, Ion 1.05 (*)    All other components within normal limits  TROPONIN I (HIGH SENSITIVITY) - Abnormal; Notable for the following components:   Troponin I (High Sensitivity) 24 (*)    All other components within normal limits  TROPONIN I (HIGH SENSITIVITY) - Abnormal;  Notable for the following components:   Troponin I (High Sensitivity) 22 (*)    All other components within normal limits  RESPIRATORY PANEL BY RT PCR (FLU A&B, COVID)  COMPREHENSIVE METABOLIC PANEL  CBC WITH DIFFERENTIAL/PLATELET    EKG     None        Radiology DG Chest 2 View  Result Date: 04/21/2020 CLINICAL DATA:  chest pain EXAM: CHEST - 2 VIEW COMPARISON:  03/04/2018 FINDINGS: Stable cardiomegaly. Pulmonary vasculature is normal. Ill-defined opacity  at the right lung base. No confluent consolidation, pleural effusion, or pneumothorax. No acute osseous abnormalities are seen. IMPRESSION: 1. Ill-defined right lung base opacity may be atelectasis or pneumonia. 2. Stable cardiomegaly. Electronically Signed   By: Keith Rake M.D.   On: 04/21/2020 15:45   CT Angio Chest/Abd/Pel for Dissection W and/or Wo Contrast  Result Date: 04/22/2020 CLINICAL DATA:  Acute back pain.  Aortic dissection is suspected. EXAM: CT ANGIOGRAPHY CHEST, ABDOMEN AND PELVIS TECHNIQUE: Non-contrast CT of the chest was initially obtained. Multidetector CT imaging through the chest, abdomen and pelvis was performed using the standard protocol during bolus administration of intravenous contrast. Multiplanar reconstructed images and MIPs were obtained and reviewed to evaluate the vascular anatomy. CONTRAST:  60mL OMNIPAQUE IOHEXOL 350 MG/ML SOLN COMPARISON:  None. FINDINGS: CTA CHEST FINDINGS Cardiovascular: Noncontrast imaging demonstrates no intramural hematoma within the ascending, transverse descending thoracic aorta. Contrast enhanced imaging demonstrates aortic dissection flap originating in the proximal aorta just above the sinus of Valsalva. The dissection flap spirasl through the transverse arch and enters the descending thoracic aorta. The dissection flap extends into the abdominal aorta to the bifurcation ultimately extends into the LEFT common iliac artery. The ascending thoracic aorta is mildly  aneurysmal at 3.8 cm. Dissection flap does not clearly enter the great vessels. Typical great vessel anatomy. No mediastinal hematoma. No pericardial fluid. Reconstitute distally. Mediastinum/Nodes: No mediastinal adenopathy Lungs/Pleura: .  No acute pulmonary findings. Musculoskeletal: No acute osseous abnormality. Review of the MIP images confirms the above findings. CTA ABDOMEN AND PELVIS FINDINGS VASCULAR Aorta: Dissection flap extends through the abdominal aorta. Celiac: Opacifies normally. SMA: Defect dissection flap enters the superior mesenteric artery with partial occlusion (image 160/series 6). The arteries appear to reconstitute distally. Renals: Single LEFT and RIGHT renal arteries opacify. The RIGHT renal artery appears opacified from the the true lumen with the LEFT opacified by the false lumen. IMA: Patent Inflow: Normal. Normal caliber of the iliac arteries. RIGHT iliac artery measures Veins: Normal Review of the MIP images confirms the above findings. NON-VASCULAR Hepatobiliary: No focal hepatic lesion. Pancreas: Rounded lesion tail the pancreas measuring 2.8 cm. (Image 146/6). Lesion is similar density to the spleen. Spleen: Normal spleen Adrenals/Urinary Tract: Kidneys enhance symmetrically ureters and bladder normal Stomach/Bowel: . Stomach, small bowel, appendix, and cecum are normal. The colon and rectosigmoid colon are normal. Lymphatic: No adenopathy Reproductive: Prostate normal Other: None Musculoskeletal: No aggressive osseous lesion Review of the MIP images confirms the above findings. IMPRESSION: 1. 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 Recommend immediate vascular surgery consultation. 2. Dissection flap extends through the entire the aorta terminating in the LEFT common iliac artery. 3. Dissection flap enters the superior mesenteric artery with PARTIAL OCCLUSION. 4. No significant aneurysmal dilatation of the aorta. 5.  Renal arteries opacify. 6. Celiac trunk and IMA are patent. 7. Rounded lesion at the tail the pancreas either represents a primary pancreatic lesion large splenule. Recommend non emergent contrast MRI of the abdomen for further evaluation. Critical Value/emergent results were called by telephone at the time of interpretation on 04/22/2020 at 6:02 pm to provider [facility] , who verbally acknowledged these results. Electronically Signed   By: Suzy Bouchard M.D.   On: 04/22/2020 18:03    Procedures .Critical Care Performed by: Lorayne Bender, PA-C Authorized by: Lorayne Bender, PA-C   Critical care provider statement:    Critical care time (minutes):  45   Critical  care time was exclusive of:  Separately billable procedures and treating other patients   Critical care was necessary to treat or prevent imminent or life-threatening deterioration of the following conditions: Aortic dissection.   Critical care was time spent personally by me on the following activities:  Ordering and performing treatments and interventions, ordering and review of laboratory studies, ordering and review of radiographic studies, pulse oximetry, re-evaluation of patient's condition, review of old charts, development of treatment plan with patient or surrogate, discussions with consultants, obtaining history from patient or surrogate, examination of patient and evaluation of patient's response to treatment   I assumed direction of critical care for this patient from another provider in my specialty: no     (including critical care time)  Medications Ordered in ED Medications  nicardipine (CARDENE) 20mg  in 0.86% saline 272ml IV infusion (0.1 mg/ml) (10 mg/hr Intravenous Rate/Dose Change 04/22/20 1905)  dexmedetomidine (PRECEDEX) 400 MCG/100ML (4 mcg/mL) infusion (has no administration in time range)  insulin regular, human (MYXREDLIN) 100 units/ 100 mL infusion (has no administration in time range)  EPINEPHrine (ADRENALIN) 4  mg in NS 250 mL (0.016 mg/mL) premix infusion (has no administration in time range)  milrinone (PRIMACOR) 20 MG/100 ML (0.2 mg/mL) infusion (has no administration in time range)  nitroGLYCERIN 50 mg in dextrose 5 % 250 mL (0.2 mg/mL) infusion (has no administration in time range)  norepinephrine (LEVOPHED) 4mg  in 292mL premix infusion (has no administration in time range)  phenylephrine (NEOSYNEPHRINE) 20-0.9 MG/250ML-% infusion (has no administration in time range)  magnesium sulfate (IV Push/IM) injection 40 mEq (has no administration in time range)  potassium chloride injection 80 mEq (has no administration in time range)  heparin 30,000 units/NS 1000 mL solution for CELLSAVER (has no administration in time range)  heparin sodium (porcine) 2,500 Units, papaverine 30 mg in electrolyte-148 (PLASMALYTE-148) 500 mL irrigation (has no administration in time range)  tranexamic acid (CYKLOKAPRON) pump prime solution 249 mg (has no administration in time range)  tranexamic acid (CYKLOKAPRON) bolus via infusion - over 30 minutes 1,870.5 mg (has no administration in time range)  tranexamic acid (CYKLOKAPRON) 2,500 mg in sodium chloride 0.9 % 250 mL (10 mg/mL) infusion (has no administration in time range)  vancomycin (VANCOREADY) IVPB 1500 mg/300 mL (has no administration in time range)  cefUROXime (ZINACEF) 1.5 g in sodium chloride 0.9 % 100 mL IVPB (has no administration in time range)  cefUROXime (ZINACEF) 750 mg in sodium chloride 0.9 % 100 mL IVPB (has no administration in time range)  ondansetron (ZOFRAN) injection 4 mg (4 mg Intravenous Given 04/22/20 1610)  morphine 4 MG/ML injection 4 mg (4 mg Intravenous Given 04/22/20 1610)  amLODipine (NORVASC) tablet 5 mg (5 mg Oral Given 04/22/20 1756)  benazepril (LOTENSIN) tablet 20 mg (20 mg Oral Given 04/22/20 1756)  iohexol (OMNIPAQUE) 350 MG/ML injection 75 mL (75 mLs Intravenous Contrast Given 04/22/20 1735)  HYDROmorphone (DILAUDID) injection 1 mg (1 mg  Intravenous Given 04/22/20 1854)    ED Course  I have reviewed the triage vital signs and the nursing notes.  Pertinent labs & imaging results that were available during my care of the patient were reviewed by me and considered in my medical decision making (see chart for details).  Clinical Course as of Apr 22 1913  Wed Apr 22, 2020  1823 Spoke with Dr. Cyndia Bent, [place] surgeon.  States he will review the patient's imaging.   [SJ]  1610 Reperformed patient's neuro exam.   Motor function and  sensation light touch intact in the extremities. Pulses are intact in the extremities.   [SJ]  [id number] Dr. Cyndia Bent at bedside. Recommending surgery.    [SJ]  1904 Blood pressure cuff moved from patient's forearm to his upper arm and switched to a larger cuff size more appropriate to the patient's size.  BP 145/76.   [SJ]    Clinical Course User Index [SJ] Joy, Helane Gunther, PA-C   MDM Rules/Calculators/A&P                          Patient presents with chest pain beginning yesterday. Patient is nontoxic appearing, afebrile, not tachycardic, not tachypneic, not hypotensive, maintains excellent SPO2 on room air.  I have reviewed the patient's chart to obtain more information.   I reviewed and interpreted the patient's labs and radiological studies. Labs from yesterday were used in initial evaluation.  He did not have specific risk factors for dissection other than hypertension.  He had no pulse deficits and no focal neurologic deficits. Patient initially discussed with Dr. Ashok Cordia, including blood pressure management. Recommended starting with patient's home oral medications first.   Aortic dissection noted on CT.  Aggressive blood pressure control was initiated. Evaluated by CT surgery and decided to take patient to the OR.  Findings and plan of care discussed with Lajean Saver, MD and then with [name], MD after EDP shift change. Dr. Vanita Panda personally evaluated and examined this patient.  Final  Clinical Impression(s) / ED Diagnoses Final diagnoses:  Dissection of thoracoabdominal aorta [facility])    Rx / DC Orders ED Discharge Orders    None       Layla Maw 04/22/20 1918    Carmin Muskrat, MD 04/22/20 2239

## 2020-04-22 NOTE — ED Notes (Signed)
TC to [place] ,Therapist, sports  In [employer]

## 2020-04-22 NOTE — Progress Notes (Signed)
Echocardiogram Echocardiogram Transesophageal has been performed.  [name] 04/22/2020, 8:35 PM

## 2020-04-22 NOTE — Anesthesia Procedure Notes (Signed)
Central Venous Catheter Insertion Performed by: Lillia Abed, MD, anesthesiologist Start/End9/29/2021 7:55 PM, 04/22/2020 8:05 PM Patient location: Pre-op. Preanesthetic checklist: patient identified, IV checked, risks and benefits discussed, surgical consent, monitors and equipment checked, pre-op evaluation, timeout performed and anesthesia consent Position: Trendelenburg Lidocaine 1% used for infiltration and patient sedated Hand hygiene performed  and maximum sterile barriers used  Catheter size: 8.5 Fr Central line and PA cath was placed.MAC introducer Swan type:thermodilution Procedure performed using ultrasound guided technique. Ultrasound Notes:anatomy identified, needle tip was noted to be adjacent to the nerve/plexus identified, no ultrasound evidence of intravascular and/or intraneural injection and image(s) printed for medical record Attempts: 1 Following insertion, line sutured and dressing applied. Post procedure assessment: blood return through all ports, free fluid flow and no air  Patient tolerated the procedure well with no immediate complications.

## 2020-04-22 NOTE — ED Provider Notes (Signed)
[place]    CSN: [id number] Arrival date & time: 04/22/20  1914      History   Chief Complaint Chief Complaint  Patient presents with  . Chest Pain  . Back Pain    HPI Richard Patterson is a 44 y.o. male.   Patient is a 44 year old male with past medical history of hypertension.  He presents today with central chest pain, back pain, right leg pain.  Describes the pain as severe impression of his chest.  Started yesterday.  Went to the ER yesterday and left that being seen due to wait time.  Noted troponin elevated and possible right lower lobe opacity.  Patient also had white blood cell count elevated.  Patient rated pain 10 out of 10 at this time.  Also having some numbness and tingling to the right leg with calf pain.  No reported history of blood clots.  Did not take his blood pressure medication this morning.     Past Medical History:  Diagnosis Date  . Essential hypertension     Patient Active Problem List   Diagnosis Date Noted  . Polycythemia 01/22/2019  . Hypertensive urgency 05/31/2016  . Chest pain 05/31/2016  . Essential hypertension 05/31/2016    Past Surgical History:  Procedure Laterality Date  . CHOLECYSTECTOMY    . HAND TENDON SURGERY Right        Home Medications    Prior to Admission medications   Medication Sig Start Date End Date Taking? Authorizing Provider  amLODipine-benazepril (LOTREL) 5-20 MG capsule Take 1 capsule by mouth daily. 06/17/19   Martin[place], Betty G, MD  diphenhydrAMINE (BENADRYL) 25 mg capsule Take 1 capsule (25 mg total) by mouth every 6 (six) hours as needed. With  reglan for headache. 06/01/16 04/22/20  Charlesetta Shanks, MD  gabapentin (NEURONTIN) 300 MG capsule Take 1 capsule (300 mg total) by mouth at bedtime. 12/11/19 04/22/20  Martin[place], Betty G, MD    Family History Family History  Problem Relation Age of Onset  . Hypertension Mother   . Hypertension Father   . Cancer Maternal Grandmother   . Cancer Maternal  Grandfather     Social History Social History   Tobacco Use  . Smoking status: Current Every Day Smoker    Types: Cigarettes  . Smokeless tobacco: Never Used  Substance Use Topics  . Alcohol use: No  . Drug use: No     Allergies   Shellfish allergy   Review of Systems Review of Systems   Physical Exam Triage Vital Signs ED Triage Vitals  Enc Vitals Group     BP 04/22/20 0905 (!) 170/88     Pulse Rate 04/22/20 0905 71     Resp 04/22/20 0905 20     Temp 04/22/20 0905 98 F (36.7 C)     Temp Source 04/22/20 0905 Oral     SpO2 04/22/20 0905 98 %     Weight 04/22/20 0851 275 lb (124.7 kg)     Height 04/22/20 0851 6\' 1"  (1.854 m)     Head Circumference --      Peak Flow --      Pain Score 04/22/20 0849 10     Pain Loc --      Pain Edu? --      Excl. in [place]? --    No data found.  Updated Vital Signs BP (!) 170/88 (BP Location: Right Arm)   Pulse 71   Temp 98 F (36.7 C) (Oral)  Resp 20   Ht 6\' 1"  (1.854 m)   Wt 275 lb (124.7 kg)   SpO2 98%   BMI 36.28 kg/m   Visual Acuity Right Eye Distance:   Left Eye Distance:   Bilateral Distance:    Right Eye Near:   Left Eye Near:    Bilateral Near:     Physical Exam Vitals and nursing note reviewed.  Constitutional:      Appearance: Normal appearance. He is ill-appearing.  HENT:     Head: Normocephalic and atraumatic.     Nose: Nose normal.  Eyes:     Conjunctiva/sclera: Conjunctivae normal.  Pulmonary:     Effort: Pulmonary effort is normal.  Musculoskeletal:        General: Normal range of motion.     Cervical back: Normal range of motion.  Skin:    General: Skin is warm and dry.  Neurological:     Mental Status: He is alert.  Psychiatric:        Mood and Affect: Mood normal.      UC Treatments / Results  Labs (all labs ordered are listed, but only abnormal results are displayed) Labs Reviewed - No data to display  EKG   Radiology DG Chest 2 View  Result Date: 04/21/2020 CLINICAL  DATA:  chest pain EXAM: CHEST - 2 VIEW COMPARISON:  03/04/2018 FINDINGS: Stable cardiomegaly. Pulmonary vasculature is normal. Ill-defined opacity at the right lung base. No confluent consolidation, pleural effusion, or pneumothorax. No acute osseous abnormalities are seen. IMPRESSION: 1. Ill-defined right lung base opacity may be atelectasis or pneumonia. 2. Stable cardiomegaly. Electronically Signed   By: Keith Rake M.D.   On: 04/21/2020 15:45    Procedures Procedures (including critical care time)  Medications Ordered in UC Medications - No data to display  Initial Impression / Assessment and Plan / UC Course  I have reviewed the triage vital signs and the nursing notes.  Pertinent labs & imaging results that were available during my care of the patient were reviewed by me and considered in my medical decision making (see chart for details).     Patient with elevated troponin, central chest pain, right calf pain and mild shortness of breath. Noted elevated troponin of 22 yesterday afternoon in the ER.  Patient without being seen.  He also had possible right lower lobe pneumonia and elevated white blood cell count. Sending to the ER for further evaluation and management,  Final Clinical Impressions(s) / UC Diagnoses   Final diagnoses:  Elevated troponin  Chest pain, unspecified type     Discharge Instructions     Sending you to the ER     ED Prescriptions    None     PDMP not reviewed this encounter.   Loura Halt A, NP 04/22/20 [phone]

## 2020-04-22 NOTE — Discharge Instructions (Signed)
Sending you to the ER

## 2020-04-23 ENCOUNTER — Encounter: Payer: Self-pay | Admitting: Gastroenterology

## 2020-04-23 ENCOUNTER — Encounter (HOSPITAL_COMMUNITY): Payer: Self-pay | Admitting: Surgery

## 2020-04-23 ENCOUNTER — Inpatient Hospital Stay (HOSPITAL_COMMUNITY): Payer: Self-pay

## 2020-04-23 DIAGNOSIS — I7102 Dissection of abdominal aorta: Secondary | ICD-10-CM

## 2020-04-23 DIAGNOSIS — Z9889 Other specified postprocedural states: Secondary | ICD-10-CM

## 2020-04-23 LAB — POCT I-STAT 7, (LYTES, BLD GAS, ICA,H+H)
Acid-base deficit: 1 mmol/L (ref 0.0–2.0)
Acid-base deficit: 1 mmol/L (ref 0.0–2.0)
Acid-base deficit: 1 mmol/L (ref 0.0–2.0)
Acid-base deficit: 2 mmol/L (ref 0.0–2.0)
Acid-base deficit: 2 mmol/L (ref 0.0–2.0)
Acid-base deficit: 3 mmol/L — ABNORMAL HIGH (ref 0.0–2.0)
Bicarbonate: 22.5 mmol/L (ref 20.0–28.0)
Bicarbonate: 23 mmol/L (ref 20.0–28.0)
Bicarbonate: 23.4 mmol/L (ref 20.0–28.0)
Bicarbonate: 24.3 mmol/L (ref 20.0–28.0)
Bicarbonate: 24.3 mmol/L (ref 20.0–28.0)
Bicarbonate: 25.7 mmol/L (ref 20.0–28.0)
Calcium, Ion: 1.02 mmol/L — ABNORMAL LOW (ref 1.15–1.40)
Calcium, Ion: 1.02 mmol/L — ABNORMAL LOW (ref 1.15–1.40)
Calcium, Ion: 1.08 mmol/L — ABNORMAL LOW (ref 1.15–1.40)
Calcium, Ion: 1.1 mmol/L — ABNORMAL LOW (ref 1.15–1.40)
Calcium, Ion: 1.13 mmol/L — ABNORMAL LOW (ref 1.15–1.40)
Calcium, Ion: 1.13 mmol/L — ABNORMAL LOW (ref 1.15–1.40)
HCT: 33 % — ABNORMAL LOW (ref 39.0–52.0)
HCT: 33 % — ABNORMAL LOW (ref 39.0–52.0)
HCT: 33 % — ABNORMAL LOW (ref 39.0–52.0)
HCT: 35 % — ABNORMAL LOW (ref 39.0–52.0)
HCT: 35 % — ABNORMAL LOW (ref 39.0–52.0)
HCT: 37 % — ABNORMAL LOW (ref 39.0–52.0)
Hemoglobin: 11.2 g/dL — ABNORMAL LOW (ref 13.0–17.0)
Hemoglobin: 11.2 g/dL — ABNORMAL LOW (ref 13.0–17.0)
Hemoglobin: 11.2 g/dL — ABNORMAL LOW (ref 13.0–17.0)
Hemoglobin: 11.9 g/dL — ABNORMAL LOW (ref 13.0–17.0)
Hemoglobin: 11.9 g/dL — ABNORMAL LOW (ref 13.0–17.0)
Hemoglobin: 12.6 g/dL — ABNORMAL LOW (ref 13.0–17.0)
O2 Saturation: 100 %
O2 Saturation: 100 %
O2 Saturation: 100 %
O2 Saturation: 91 %
O2 Saturation: 93 %
O2 Saturation: 95 %
Patient temperature: 35.8
Patient temperature: 36.9
Patient temperature: 37
Potassium: 3.2 mmol/L — ABNORMAL LOW (ref 3.5–5.1)
Potassium: 3.2 mmol/L — ABNORMAL LOW (ref 3.5–5.1)
Potassium: 3.5 mmol/L (ref 3.5–5.1)
Potassium: 3.7 mmol/L (ref 3.5–5.1)
Potassium: 4.1 mmol/L (ref 3.5–5.1)
Potassium: 4.2 mmol/L (ref 3.5–5.1)
Sodium: 138 mmol/L (ref 135–145)
Sodium: 138 mmol/L (ref 135–145)
Sodium: 140 mmol/L (ref 135–145)
Sodium: 143 mmol/L (ref 135–145)
Sodium: 143 mmol/L (ref 135–145)
Sodium: 144 mmol/L (ref 135–145)
TCO2: 24 mmol/L (ref 22–32)
TCO2: 24 mmol/L (ref 22–32)
TCO2: 25 mmol/L (ref 22–32)
TCO2: 26 mmol/L (ref 22–32)
TCO2: 26 mmol/L (ref 22–32)
TCO2: 27 mmol/L (ref 22–32)
pCO2 arterial: 38 mmHg (ref 32.0–48.0)
pCO2 arterial: 39.8 mmHg (ref 32.0–48.0)
pCO2 arterial: 41.1 mmHg (ref 32.0–48.0)
pCO2 arterial: 41.6 mmHg (ref 32.0–48.0)
pCO2 arterial: 45.5 mmHg (ref 32.0–48.0)
pCO2 arterial: 53.3 mmHg — ABNORMAL HIGH (ref 32.0–48.0)
pH, Arterial: 7.292 — ABNORMAL LOW (ref 7.350–7.450)
pH, Arterial: 7.33 — ABNORMAL LOW (ref 7.350–7.450)
pH, Arterial: 7.347 — ABNORMAL LOW (ref 7.350–7.450)
pH, Arterial: 7.37 (ref 7.350–7.450)
pH, Arterial: 7.374 (ref 7.350–7.450)
pH, Arterial: 7.396 (ref 7.350–7.450)
pO2, Arterial: 313 mmHg — ABNORMAL HIGH (ref 83.0–108.0)
pO2, Arterial: 319 mmHg — ABNORMAL HIGH (ref 83.0–108.0)
pO2, Arterial: 338 mmHg — ABNORMAL HIGH (ref 83.0–108.0)
pO2, Arterial: 64 mmHg — ABNORMAL LOW (ref 83.0–108.0)
pO2, Arterial: 70 mmHg — ABNORMAL LOW (ref 83.0–108.0)
pO2, Arterial: 79 mmHg — ABNORMAL LOW (ref 83.0–108.0)

## 2020-04-23 LAB — GLUCOSE, CAPILLARY
Glucose-Capillary: 109 mg/dL — ABNORMAL HIGH (ref 70–99)
Glucose-Capillary: 115 mg/dL — ABNORMAL HIGH (ref 70–99)
Glucose-Capillary: 115 mg/dL — ABNORMAL HIGH (ref 70–99)
Glucose-Capillary: 123 mg/dL — ABNORMAL HIGH (ref 70–99)
Glucose-Capillary: 133 mg/dL — ABNORMAL HIGH (ref 70–99)
Glucose-Capillary: 137 mg/dL — ABNORMAL HIGH (ref 70–99)
Glucose-Capillary: 139 mg/dL — ABNORMAL HIGH (ref 70–99)
Glucose-Capillary: 143 mg/dL — ABNORMAL HIGH (ref 70–99)
Glucose-Capillary: 154 mg/dL — ABNORMAL HIGH (ref 70–99)
Glucose-Capillary: 155 mg/dL — ABNORMAL HIGH (ref 70–99)
Glucose-Capillary: 156 mg/dL — ABNORMAL HIGH (ref 70–99)
Glucose-Capillary: 161 mg/dL — ABNORMAL HIGH (ref 70–99)
Glucose-Capillary: 166 mg/dL — ABNORMAL HIGH (ref 70–99)
Glucose-Capillary: 198 mg/dL — ABNORMAL HIGH (ref 70–99)
Glucose-Capillary: 286 mg/dL — ABNORMAL HIGH (ref 70–99)
Glucose-Capillary: 31 mg/dL — CL (ref 70–99)

## 2020-04-23 LAB — CBC
HCT: 35.6 % — ABNORMAL LOW (ref 39.0–52.0)
Hemoglobin: 12 g/dL — ABNORMAL LOW (ref 13.0–17.0)
MCH: 31.6 pg (ref 26.0–34.0)
MCHC: 33.7 g/dL (ref 30.0–36.0)
MCV: 93.7 fL (ref 80.0–100.0)
Platelets: 125 10*3/uL — ABNORMAL LOW (ref 150–400)
RBC: 3.8 MIL/uL — ABNORMAL LOW (ref 4.22–5.81)
RDW: 13.1 % (ref 11.5–15.5)
WBC: 14.6 10*3/uL — ABNORMAL HIGH (ref 4.0–10.5)
nRBC: 0 % (ref 0.0–0.2)

## 2020-04-23 LAB — POCT I-STAT, CHEM 8
BUN: 12 mg/dL (ref 6–20)
BUN: 12 mg/dL (ref 6–20)
BUN: 13 mg/dL (ref 6–20)
Calcium, Ion: 1.01 mmol/L — ABNORMAL LOW (ref 1.15–1.40)
Calcium, Ion: 1.04 mmol/L — ABNORMAL LOW (ref 1.15–1.40)
Calcium, Ion: 1.09 mmol/L — ABNORMAL LOW (ref 1.15–1.40)
Chloride: 100 mmol/L (ref 98–111)
Chloride: 101 mmol/L (ref 98–111)
Chloride: 102 mmol/L (ref 98–111)
Creatinine, Ser: 0.9 mg/dL (ref 0.61–1.24)
Creatinine, Ser: 0.9 mg/dL (ref 0.61–1.24)
Creatinine, Ser: 1.1 mg/dL (ref 0.61–1.24)
Glucose, Bld: 131 mg/dL — ABNORMAL HIGH (ref 70–99)
Glucose, Bld: 137 mg/dL — ABNORMAL HIGH (ref 70–99)
Glucose, Bld: 138 mg/dL — ABNORMAL HIGH (ref 70–99)
HCT: 33 % — ABNORMAL LOW (ref 39.0–52.0)
HCT: 33 % — ABNORMAL LOW (ref 39.0–52.0)
HCT: 33 % — ABNORMAL LOW (ref 39.0–52.0)
Hemoglobin: 11.2 g/dL — ABNORMAL LOW (ref 13.0–17.0)
Hemoglobin: 11.2 g/dL — ABNORMAL LOW (ref 13.0–17.0)
Hemoglobin: 11.2 g/dL — ABNORMAL LOW (ref 13.0–17.0)
Potassium: 3.1 mmol/L — ABNORMAL LOW (ref 3.5–5.1)
Potassium: 3.2 mmol/L — ABNORMAL LOW (ref 3.5–5.1)
Potassium: 3.8 mmol/L (ref 3.5–5.1)
Sodium: 137 mmol/L (ref 135–145)
Sodium: 138 mmol/L (ref 135–145)
Sodium: 140 mmol/L (ref 135–145)
TCO2: 23 mmol/L (ref 22–32)
TCO2: 24 mmol/L (ref 22–32)
TCO2: 27 mmol/L (ref 22–32)

## 2020-04-23 LAB — CBC WITH DIFFERENTIAL/PLATELET
Abs Immature Granulocytes: 0.08 10*3/uL — ABNORMAL HIGH (ref 0.00–0.07)
Basophils Absolute: 0 10*3/uL (ref 0.0–0.1)
Basophils Relative: 0 %
Eosinophils Absolute: 0 10*3/uL (ref 0.0–0.5)
Eosinophils Relative: 0 %
HCT: 37.3 % — ABNORMAL LOW (ref 39.0–52.0)
Hemoglobin: 12.7 g/dL — ABNORMAL LOW (ref 13.0–17.0)
Immature Granulocytes: 1 %
Lymphocytes Relative: 5 %
Lymphs Abs: 0.8 10*3/uL (ref 0.7–4.0)
MCH: 32.3 pg (ref 26.0–34.0)
MCHC: 34 g/dL (ref 30.0–36.0)
MCV: 94.9 fL (ref 80.0–100.0)
Monocytes Absolute: 0.7 10*3/uL (ref 0.1–1.0)
Monocytes Relative: 5 %
Neutro Abs: 13.3 10*3/uL — ABNORMAL HIGH (ref 1.7–7.7)
Neutrophils Relative %: 89 %
Platelets: 143 10*3/uL — ABNORMAL LOW (ref 150–400)
RBC: 3.93 MIL/uL — ABNORMAL LOW (ref 4.22–5.81)
RDW: 13.1 % (ref 11.5–15.5)
WBC: 14.9 10*3/uL — ABNORMAL HIGH (ref 4.0–10.5)
nRBC: 0 % (ref 0.0–0.2)

## 2020-04-23 LAB — BASIC METABOLIC PANEL
Anion gap: 7 (ref 5–15)
Anion gap: 7 (ref 5–15)
BUN: 12 mg/dL (ref 6–20)
BUN: 17 mg/dL (ref 6–20)
CO2: 24 mmol/L (ref 22–32)
CO2: 24 mmol/L (ref 22–32)
Calcium: 7.4 mg/dL — ABNORMAL LOW (ref 8.9–10.3)
Calcium: 7.7 mg/dL — ABNORMAL LOW (ref 8.9–10.3)
Chloride: 108 mmol/L (ref 98–111)
Chloride: 112 mmol/L — ABNORMAL HIGH (ref 98–111)
Creatinine, Ser: 0.92 mg/dL (ref 0.61–1.24)
Creatinine, Ser: 0.91 mg/dL (ref 0.61–1.24)
GFR calc Af Amer: >60 mL/min (ref >60)
GFR calc Af Amer: >60 mL/min (ref >60)
GFR calc non Af Amer: >60 mL/min (ref >60)
GFR calc non Af Amer: >60 mL/min (ref >60)
Glucose, Bld: 159 mg/dL — ABNORMAL HIGH (ref 70–99)
Glucose, Bld: 119 mg/dL — ABNORMAL HIGH (ref 70–99)
Potassium: 4.4 mmol/L (ref 3.5–5.1)
Potassium: 4.1 mmol/L (ref 3.5–5.1)
Sodium: 139 mmol/L (ref 135–145)
Sodium: 143 mmol/L (ref 135–145)

## 2020-04-23 LAB — MAGNESIUM
Magnesium: 1.9 mg/dL (ref 1.7–2.4)
Magnesium: 2.6 mg/dL — ABNORMAL HIGH (ref 1.7–2.4)

## 2020-04-23 LAB — HEMOGLOBIN AND HEMATOCRIT, BLOOD
HCT: 33.2 % — ABNORMAL LOW (ref 39.0–52.0)
Hemoglobin: 11.3 g/dL — ABNORMAL LOW (ref 13.0–17.0)

## 2020-04-23 LAB — PROTIME-INR
INR: 1.4 — ABNORMAL HIGH (ref 0.8–1.2)
Prothrombin Time: 16.5 seconds — ABNORMAL HIGH (ref 11.4–15.2)

## 2020-04-23 LAB — PLATELET COUNT: Platelets: 80 10*3/uL — ABNORMAL LOW (ref 150–400)

## 2020-04-23 LAB — FIBRINOGEN
Fibrinogen: 258 mg/dL (ref 210–475)
Fibrinogen: 350 mg/dL (ref 210–475)

## 2020-04-23 LAB — SURGICAL PCR SCREEN
MRSA, PCR: NEGATIVE
Staphylococcus aureus: NEGATIVE

## 2020-04-23 LAB — APTT: aPTT: 35 seconds (ref 24–36)

## 2020-04-23 MED ORDER — DOCUSATE SODIUM 100 MG PO CAPS
200.0000 mg | ORAL_CAPSULE | Freq: Every day | ORAL | Status: DC
  Administered 2020-04-24 – 2020-04-28 (×3): 200 mg via ORAL
  Filled 2020-04-23 (×3): qty 2

## 2020-04-23 MED ORDER — PHENYLEPHRINE HCL-NACL 20-0.9 MG/250ML-% IV SOLN: 0.0000 ug/min | INTRAVENOUS | Status: DC

## 2020-04-23 MED ORDER — SODIUM CHLORIDE 0.9 % IV SOLN: INTRAVENOUS | Status: DC | PRN

## 2020-04-23 MED ORDER — SODIUM CHLORIDE 0.9 % IV SOLN: INTRAVENOUS | Status: DC

## 2020-04-23 MED ORDER — NITROGLYCERIN IN D5W 200-5 MCG/ML-% IV SOLN: 2.0000 ug/min | INTRAVENOUS | Status: DC

## 2020-04-23 MED ORDER — MIDAZOLAM HCL 2 MG/2ML IJ SOLN: 2.0000 mg | INTRAMUSCULAR | Status: DC | PRN

## 2020-04-23 MED ORDER — DEXTROSE 50 % IV SOLN: 0.0000 mL | INTRAVENOUS | Status: DC | PRN

## 2020-04-23 MED ORDER — FENTANYL CITRATE (PF) 250 MCG/5ML IJ SOLN
INTRAMUSCULAR | Status: AC
  Filled 2020-04-23: qty 5

## 2020-04-23 MED ORDER — CHLORHEXIDINE GLUCONATE 0.12 % MT SOLN
15.0000 mL | OROMUCOSAL | Status: AC
  Administered 2020-04-23: 15 mL via OROMUCOSAL

## 2020-04-23 MED ORDER — ACETAMINOPHEN 160 MG/5ML PO SOLN: 1000.0000 mg | Freq: Four times a day (QID) | ORAL | Status: DC

## 2020-04-23 MED ORDER — OXYCODONE HCL 5 MG PO TABS
5.0000 mg | ORAL_TABLET | ORAL | Status: DC | PRN
  Administered 2020-04-23 (×3): 5 mg via ORAL
  Administered 2020-04-24 – 2020-04-28 (×9): 10 mg via ORAL
  Administered 2020-04-28: 5 mg via ORAL
  Filled 2020-04-23: qty 1
  Filled 2020-04-23 (×3): qty 2
  Filled 2020-04-23: qty 1
  Filled 2020-04-23: qty 2
  Filled 2020-04-23: qty 1
  Filled 2020-04-23 (×4): qty 2
  Filled 2020-04-23: qty 1
  Filled 2020-04-23: qty 2

## 2020-04-23 MED ORDER — ACETAMINOPHEN 160 MG/5ML PO SOLN
650.0000 mg | Freq: Once | ORAL | Status: AC
  Administered 2020-04-23: 650 mg
  Filled 2020-04-23: qty 20.3

## 2020-04-23 MED ORDER — METOPROLOL TARTRATE 5 MG/5ML IV SOLN
2.5000 mg | INTRAVENOUS | Status: DC | PRN
  Administered 2020-04-23: 5 mg via INTRAVENOUS
  Administered 2020-04-24: 2.5 mg via INTRAVENOUS
  Administered 2020-04-26 (×2): 5 mg via INTRAVENOUS
  Filled 2020-04-23 (×2): qty 5

## 2020-04-23 MED ORDER — PANTOPRAZOLE SODIUM 40 MG PO TBEC
40.0000 mg | DELAYED_RELEASE_TABLET | Freq: Every day | ORAL | Status: DC
  Administered 2020-04-25 – 2020-04-29 (×5): 40 mg via ORAL
  Filled 2020-04-23 (×5): qty 1

## 2020-04-23 MED ORDER — SODIUM CHLORIDE 0.9 % IV SOLN
1.5000 g | Freq: Two times a day (BID) | INTRAVENOUS | Status: AC
  Administered 2020-04-23 – 2020-04-24 (×4): 1.5 g via INTRAVENOUS
  Filled 2020-04-23 (×4): qty 1.5

## 2020-04-23 MED ORDER — SODIUM CHLORIDE 0.9% FLUSH: 10.0000 mL | INTRAVENOUS | Status: DC | PRN

## 2020-04-23 MED ORDER — SODIUM CHLORIDE 0.45 % IV SOLN: INTRAVENOUS | Status: DC

## 2020-04-23 MED ORDER — NITROGLYCERIN 0.2 MG/ML ON CALL CATH LAB
INTRAVENOUS | Status: DC | PRN
  Administered 2020-04-23 (×5): 20 ug via INTRAVENOUS

## 2020-04-23 MED ORDER — NITROPRUSSIDE SODIUM-NACL 20-0.9 MG/100ML-% IV SOLN
0.0000 ug/kg/min | INTRAVENOUS | Status: DC
  Administered 2020-04-24: 0.9 ug/kg/min via INTRAVENOUS
  Administered 2020-04-24: 0.901 ug/kg/min via INTRAVENOUS
  Administered 2020-04-24: 1 ug/kg/min via INTRAVENOUS
  Filled 2020-04-23 (×6): qty 100

## 2020-04-23 MED ORDER — CHLORHEXIDINE GLUCONATE CLOTH 2 % EX PADS
6.0000 | MEDICATED_PAD | Freq: Every day | CUTANEOUS | Status: DC
  Administered 2020-04-25 – 2020-04-27 (×3): 6 via TOPICAL

## 2020-04-23 MED ORDER — DEXMEDETOMIDINE HCL IN NACL 400 MCG/100ML IV SOLN
0.0000 ug/kg/h | INTRAVENOUS | Status: DC
  Administered 2020-04-23: 1 ug/kg/h via INTRAVENOUS
  Filled 2020-04-23: qty 100

## 2020-04-23 MED ORDER — ACETAMINOPHEN 650 MG RE SUPP: 650.0000 mg | Freq: Once | RECTAL | Status: AC

## 2020-04-23 MED ORDER — LACTATED RINGERS IV SOLN: INTRAVENOUS | Status: DC

## 2020-04-23 MED ORDER — SODIUM CHLORIDE 0.9% FLUSH: 3.0000 mL | INTRAVENOUS | Status: DC | PRN

## 2020-04-23 MED ORDER — DEXTROSE 50 % IV SOLN
INTRAVENOUS | Status: AC
  Administered 2020-04-23: 50 mL
  Filled 2020-04-23: qty 50

## 2020-04-23 MED ORDER — INSULIN REGULAR(HUMAN) IN NACL 100-0.9 UT/100ML-% IV SOLN: INTRAVENOUS | Status: DC

## 2020-04-23 MED ORDER — ACETAMINOPHEN 650 MG RE SUPP: 650.0000 mg | Freq: Once | RECTAL | Status: DC

## 2020-04-23 MED ORDER — ROCURONIUM BROMIDE 10 MG/ML (PF) SYRINGE
PREFILLED_SYRINGE | INTRAVENOUS | Status: AC
  Filled 2020-04-23: qty 40

## 2020-04-23 MED ORDER — CHLORHEXIDINE GLUCONATE CLOTH 2 % EX PADS
6.0000 | MEDICATED_PAD | Freq: Every day | CUTANEOUS | Status: DC
  Administered 2020-04-23: 6 via TOPICAL

## 2020-04-23 MED ORDER — ONDANSETRON HCL 4 MG/2ML IJ SOLN: 4.0000 mg | Freq: Four times a day (QID) | INTRAMUSCULAR | Status: DC | PRN

## 2020-04-23 MED ORDER — PROTAMINE SULFATE 10 MG/ML IV SOLN
INTRAVENOUS | Status: DC | PRN
  Administered 2020-04-23 (×4): 50 mg via INTRAVENOUS
  Administered 2020-04-23: 40 mg via INTRAVENOUS
  Administered 2020-04-23 (×4): 50 mg via INTRAVENOUS

## 2020-04-23 MED ORDER — MORPHINE SULFATE (PF) 2 MG/ML IV SOLN
1.0000 mg | INTRAVENOUS | Status: DC | PRN
  Administered 2020-04-23 (×4): 2 mg via INTRAVENOUS
  Administered 2020-04-24: 4 mg via INTRAVENOUS
  Filled 2020-04-23: qty 1
  Filled 2020-04-23: qty 2
  Filled 2020-04-23 (×3): qty 1

## 2020-04-23 MED ORDER — LIDOCAINE 2% (20 MG/ML) 5 ML SYRINGE
INTRAMUSCULAR | Status: AC
  Filled 2020-04-23: qty 20

## 2020-04-23 MED ORDER — VANCOMYCIN HCL IN DEXTROSE 1-5 GM/200ML-% IV SOLN
1000.0000 mg | Freq: Once | INTRAVENOUS | Status: AC
  Administered 2020-04-23: 1000 mg via INTRAVENOUS
  Filled 2020-04-23: qty 200

## 2020-04-23 MED ORDER — NITROPRUSSIDE SODIUM-NACL 20-0.9 MG/100ML-% IV SOLN
0.0000 ug/kg/min | INTRAVENOUS | Status: DC
  Filled 2020-04-23: qty 100

## 2020-04-23 MED ORDER — ALBUMIN HUMAN 5 % IV SOLN: 250.0000 mL | INTRAVENOUS | Status: DC | PRN

## 2020-04-23 MED ORDER — BISACODYL 5 MG PO TBEC
10.0000 mg | DELAYED_RELEASE_TABLET | Freq: Every day | ORAL | Status: DC
  Administered 2020-04-24 – 2020-04-28 (×3): 10 mg via ORAL
  Filled 2020-04-23 (×4): qty 2

## 2020-04-23 MED ORDER — FAMOTIDINE IN NACL 20-0.9 MG/50ML-% IV SOLN
20.0000 mg | Freq: Two times a day (BID) | INTRAVENOUS | Status: AC
  Administered 2020-04-23 (×2): 20 mg via INTRAVENOUS
  Filled 2020-04-23 (×2): qty 50

## 2020-04-23 MED ORDER — TRAMADOL HCL 50 MG PO TABS
50.0000 mg | ORAL_TABLET | ORAL | Status: DC | PRN
  Administered 2020-04-25 – 2020-04-26 (×2): 100 mg via ORAL
  Filled 2020-04-23 (×2): qty 2
  Filled 2020-04-23: qty 1

## 2020-04-23 MED ORDER — DEXMEDETOMIDINE HCL IN NACL 400 MCG/100ML IV SOLN
0.1000 ug/kg/h | INTRAVENOUS | Status: DC
  Filled 2020-04-23: qty 100

## 2020-04-23 MED ORDER — ORAL CARE MOUTH RINSE
15.0000 mL | OROMUCOSAL | Status: DC
  Administered 2020-04-23 (×2): 15 mL via OROMUCOSAL

## 2020-04-23 MED ORDER — SODIUM CHLORIDE 0.9% FLUSH
3.0000 mL | Freq: Two times a day (BID) | INTRAVENOUS | Status: DC
  Administered 2020-04-24 – 2020-04-27 (×7): 3 mL via INTRAVENOUS

## 2020-04-23 MED ORDER — CHLORHEXIDINE GLUCONATE 0.12% ORAL RINSE (MEDLINE KIT): 15.0000 mL | Freq: Two times a day (BID) | OROMUCOSAL | Status: DC

## 2020-04-23 MED ORDER — POTASSIUM CHLORIDE 10 MEQ/50ML IV SOLN
10.0000 meq | INTRAVENOUS | Status: AC
  Administered 2020-04-23 (×3): 10 meq via INTRAVENOUS

## 2020-04-23 MED ORDER — SODIUM CHLORIDE 0.9 % IV SOLN: 250.0000 mL | INTRAVENOUS | Status: DC

## 2020-04-23 MED ORDER — BISACODYL 10 MG RE SUPP: 10.0000 mg | Freq: Every day | RECTAL | Status: DC

## 2020-04-23 MED ORDER — POTASSIUM CHLORIDE 10 MEQ/50ML IV SOLN: 10.0000 meq | INTRAVENOUS | Status: AC

## 2020-04-23 MED ORDER — SODIUM CHLORIDE 0.45 % IV SOLN: INTRAVENOUS | Status: DC | PRN

## 2020-04-23 MED ORDER — DEXTROSE 50 % IV SOLN
INTRAVENOUS | Status: AC
  Administered 2020-04-23: 10 mL via INTRAVENOUS
  Filled 2020-04-23: qty 50

## 2020-04-23 MED ORDER — ASPIRIN 81 MG PO CHEW
324.0000 mg | CHEWABLE_TABLET | Freq: Every day | ORAL | Status: DC
  Filled 2020-04-23: qty 4

## 2020-04-23 MED ORDER — ASPIRIN EC 325 MG PO TBEC
325.0000 mg | DELAYED_RELEASE_TABLET | Freq: Every day | ORAL | Status: DC
  Administered 2020-04-24 – 2020-04-29 (×6): 325 mg via ORAL
  Filled 2020-04-23 (×6): qty 1

## 2020-04-23 MED ORDER — FUROSEMIDE 10 MG/ML IJ SOLN
40.0000 mg | Freq: Two times a day (BID) | INTRAMUSCULAR | Status: AC
  Administered 2020-04-23 (×2): 40 mg via INTRAVENOUS
  Filled 2020-04-23 (×2): qty 4

## 2020-04-23 MED ORDER — METOPROLOL TARTRATE 12.5 MG HALF TABLET: 12.5000 mg | ORAL_TABLET | Freq: Two times a day (BID) | ORAL | Status: DC

## 2020-04-23 MED ORDER — NITROGLYCERIN IN D5W 200-5 MCG/ML-% IV SOLN
0.0000 ug/min | INTRAVENOUS | Status: DC
  Administered 2020-04-24: 90 ug/min via INTRAVENOUS
  Administered 2020-04-24: 100 ug/min via INTRAVENOUS
  Administered 2020-04-25: 25 ug/min via INTRAVENOUS
  Filled 2020-04-23 (×3): qty 250

## 2020-04-23 MED ORDER — MUPIROCIN 2 % EX OINT
1.0000 "application " | TOPICAL_OINTMENT | Freq: Two times a day (BID) | CUTANEOUS | Status: DC
  Administered 2020-04-23 – 2020-04-26 (×8): 1 via NASAL
  Filled 2020-04-23: qty 22

## 2020-04-23 MED ORDER — ORAL CARE MOUTH RINSE
15.0000 mL | Freq: Two times a day (BID) | OROMUCOSAL | Status: DC
  Administered 2020-04-23 – 2020-04-28 (×7): 15 mL via OROMUCOSAL

## 2020-04-23 MED ORDER — PHENYLEPHRINE 40 MCG/ML (10ML) SYRINGE FOR IV PUSH (FOR BLOOD PRESSURE SUPPORT)
PREFILLED_SYRINGE | INTRAVENOUS | Status: AC
  Filled 2020-04-23: qty 40

## 2020-04-23 MED ORDER — METOPROLOL TARTRATE 25 MG/10 ML ORAL SUSPENSION: 12.5000 mg | Freq: Two times a day (BID) | ORAL | Status: DC

## 2020-04-23 MED ORDER — ACETAMINOPHEN 160 MG/5ML PO SOLN: 650.0000 mg | Freq: Once | ORAL | Status: DC

## 2020-04-23 MED ORDER — SODIUM CHLORIDE 0.9% FLUSH
10.0000 mL | Freq: Two times a day (BID) | INTRAVENOUS | Status: DC
  Administered 2020-04-23: 20 mL
  Administered 2020-04-23 – 2020-04-24 (×2): 10 mL

## 2020-04-23 MED ORDER — NITROPRUSSIDE SODIUM-NACL 20-0.9 MG/100ML-% IV SOLN
INTRAVENOUS | Status: DC | PRN
  Administered 2020-04-23: .125 ug/kg/min via INTRAVENOUS

## 2020-04-23 MED ORDER — MAGNESIUM SULFATE 4 GM/100ML IV SOLN
4.0000 g | Freq: Once | INTRAVENOUS | Status: AC
  Administered 2020-04-23: 4 g via INTRAVENOUS
  Filled 2020-04-23: qty 100

## 2020-04-23 MED ORDER — LACTATED RINGERS IV SOLN: 500.0000 mL | Freq: Once | INTRAVENOUS | Status: DC | PRN

## 2020-04-23 MED ORDER — ACETAMINOPHEN 500 MG PO TABS
1000.0000 mg | ORAL_TABLET | Freq: Four times a day (QID) | ORAL | Status: DC
  Administered 2020-04-24 – 2020-04-29 (×14): 1000 mg via ORAL
  Filled 2020-04-23 (×15): qty 2

## 2020-04-23 MED FILL — Potassium Chloride Inj 2 mEq/ML: INTRAVENOUS | Qty: 40 | Status: AC

## 2020-04-23 MED FILL — Thrombin (Recombinant) For Soln 20000 Unit: CUTANEOUS | Qty: 1 | Status: AC

## 2020-04-23 MED FILL — Heparin Sodium (Porcine) Inj 1000 Unit/ML: INTRAMUSCULAR | Qty: 30 | Status: AC

## 2020-04-23 MED FILL — Sodium Chloride IV Soln 0.9%: INTRAVENOUS | Qty: 6000 | Status: AC

## 2020-04-23 MED FILL — Lidocaine HCl Local Soln Prefilled Syringe 100 MG/5ML (2%): INTRAMUSCULAR | Qty: 15 | Status: AC

## 2020-04-23 MED FILL — Heparin Sodium (Porcine) Inj 1000 Unit/ML: INTRAMUSCULAR | Qty: 10 | Status: AC

## 2020-04-23 MED FILL — Magnesium Sulfate Inj 50%: INTRAMUSCULAR | Qty: 10 | Status: AC

## 2020-04-23 MED FILL — Mannitol IV Soln 20%: INTRAVENOUS | Qty: 500 | Status: AC

## 2020-04-23 MED FILL — Electrolyte-R (PH 7.4) Solution: INTRAVENOUS | Qty: 4000 | Status: AC

## 2020-04-23 MED FILL — Sodium Bicarbonate IV Soln 8.4%: INTRAVENOUS | Qty: 50 | Status: AC

## 2020-04-23 NOTE — Progress Notes (Signed)
1 Day Post-Op Procedure(s) (LRB): REPAIR OF AORTIC DISSECTION USING HEMASHIELD PLATINUM 30 MM VASCULAR GRAFT. (N/A) Subjective:  Woke up postop and following commands.  Objective: Vital signs in last 24 hours: Temp:  [96.3 F (35.7 C)-98.2 F (36.8 C)] 98.2 F (36.8 C) (09/30 1000) Pulse Rate:  [60-87] 71 (09/30 1000) Cardiac Rhythm: Normal sinus rhythm (09/30 0800) Resp:  [12-29] 21 (09/30 1000) BP: (95-250)/(66-111) 112/71 (09/30 1000) SpO2:  [92 %-100 %] 100 % (09/30 1000) Arterial Line BP: (86-149)/(57-83) 132/75 (09/30 1000) FiO2 (%):  [50 %-70 %] 50 % (09/30 0800)  Hemodynamic parameters for last 24 hours: PAP: (29-38)/(9-25) 35/24 CO:  [4 L/min-4.8 L/min] 4.4 L/min CI:  [1.6 L/min/m2-1.9 L/min/m2] 1.8 L/min/m2  Intake/Output from previous day: 09/29 0701 - 09/30 0700 In: 8252.7 [I.V.:6217.6; Blood:1522; NG/GT:30; IV Piggyback:483.1] Out: 3580 [Urine:2285; Emesis/NG output:100; Blood:1165; Chest Tube:30] Intake/Output this shift: Total I/O In: 367.8 [I.V.:208.2; IV Piggyback:159.7] Out: 408 [Urine:260; Chest Tube:148]  General appearance: sedated on vent Heart: regular rate and rhythm, S1, S2 normal, no murmur, click, rub or gallop Lungs: clear to auscultation bilaterally Extremities: edema moderate, warm  Wound: dressings dry  Lab Results: Recent Labs    04/22/20 1830 04/22/20 1849 04/23/20 0044 04/23/20 0046 04/23/20 0447 04/23/20 0636  WBC 11.6*  --   --   --   --  14.9*  HGB 15.3   < > 11.3*   < > 12.6* 12.7*  HCT 45.1   < > 33.2*   < > 37.0* 37.3*  PLT 134*   < > 80*  --   --  143*   < > = values in this interval not displayed.   BMET:  Recent Labs    04/22/20 1830 04/22/20 1849 04/23/20 0139 04/23/20 0142 04/23/20 0447 04/23/20 0636  NA 137   < > 140   < > 143 139  K 3.6   < > 3.1*   < > 3.5 4.4  CL 102   < > 101  --   --  108  CO2 26  --   --   --   --  24  GLUCOSE 99   < > 137*  --   --  159*  BUN 11   < > 13  --   --  12  CREATININE  0.82   < > 0.90  --   --  0.92  CALCIUM 8.6*  --   --   --   --  7.4*   < > = values in this interval not displayed.    PT/INR:  Recent Labs    04/23/20 0822  LABPROT 16.5*  INR 1.4*   ABG    Component Value Date/Time   PHART 7.330 (L) 04/23/2020 0447   HCO3 24.3 04/23/2020 0447   TCO2 26 04/23/2020 0447   ACIDBASEDEF 2.0 04/23/2020 0447   O2SAT 95.0 04/23/2020 0447   CBG (last 3)  Recent Labs    04/23/20 0650 04/23/20 0905 04/23/20 0925  GLUCAP 166* 31* 286*    Assessment/Plan: S/P Procedure(s) (LRB): REPAIR OF AORTIC DISSECTION USING HEMASHIELD PLATINUM 30 MM VASCULAR GRAFT. (N/A)  POD O  Hemodynamically stable. Severe HTN preop. Will use NTG, Nipride, Cardene as needed for BP control. Try to keep MAP<90.  Wean vent as tolerated. Facial edema improved. Had some oropharyngeal trauma from difficult intubation. Will diurese some to decrease edema.  CT output low.  Postop labs ok.     LOS: 1 day  Gaye Pollack 04/23/2020

## 2020-04-23 NOTE — Addendum Note (Signed)
Addendum  created 04/23/20 0706 by Josephine Igo, CRNA   Order list changed

## 2020-04-23 NOTE — Care Management (Signed)
1400 04-23-20 Patient post op day 1 repair of aortic dissection. Patient continues on IV Lasix. Case Manager will continue to follow for transition of care needs. Bethena Roys, RN,BSN Case Manager

## 2020-04-23 NOTE — Transfer of Care (Signed)
Immediate Anesthesia Transfer of Care Note  [name]  Procedure(s) Performed: REPAIR OF AORTIC DISSECTION USING HEMASHIELD PLATINUM 30 MM VASCULAR GRAFT. (N/A Chest)  Patient Location: ICU  Anesthesia Type:General  Level of Consciousness: Patient remains intubated per anesthesia plan  Airway & Oxygen Therapy: Patient remains intubated per anesthesia plan and Patient placed on Ventilator (see vital sign flow sheet for setting)  Post-op Assessment: Report given to RN and Post -op Vital signs reviewed and stable  Post vital signs: Reviewed and stable  Last Vitals:  Vitals Value Taken Time  BP 102/60 Right ABP   Temp    Pulse 80 (A-paced)   Resp 12   SpO2 92    Report to [name] RN  Last Pain:  Vitals:   04/22/20 0947  TempSrc:   PainSc: 8      Report to [name] RN in [facility], [name] at bedside, RT present at bedside, applied to ventilator, PRVC 50% FiO2 630 ml / 12 RR, breath sounds present and clear bilaterally, A-paced rate 80 on monitor (changed via [name] at bedside), goals of SBP < 120mmHg informed to team, reviewed current infusions, patient's [name] mask on IV pole (personal item), transfer of patient in safe and stable condition.     Complications: No complications documented.

## 2020-04-23 NOTE — Progress Notes (Signed)
Patient ID: Richard Patterson, male   DOB: 01-Jan-1976, 44 y.o.   MRN: [id number] EVENING ROUNDS NOTE :     [address]       [place] [address]             [phone]                 1 Day Post-Op Procedure(s) (LRB): REPAIR OF AORTIC DISSECTION USING HEMASHIELD PLATINUM 30 MM VASCULAR GRAFT. (N/A)  Total Length of Stay:  LOS: 1 day  BP (!) 106/58   Pulse 78   Temp 98.1 F (36.7 C)   Resp (!) 25   SpO2 96%   .Intake/Output      09/29 0701 - 09/30 0700 09/30 0701 - 10/01 0700   P.O.  480   I.V. 6217.6 531.7   Blood 1522    NG/GT 30    IV Piggyback 483.1 396.5   Total Intake 8252.7 1408.2   Urine 2285 855   Emesis/NG output 100    Blood 1165    Chest Tube 30 302   Total Output 3580 1157   Net +4672.7 +251.2          . sodium chloride Stopped (04/23/20 1628)  . sodium chloride    . [START ON 04/24/2020] sodium chloride    . sodium chloride 10 mL/hr at 04/23/20 0902  . albumin human    . cefUROXime (ZINACEF)  IV Stopped (04/23/20 0946)  . dexmedetomidine (PRECEDEX) IV infusion Stopped (04/23/20 1105)  . famotidine (PEPCID) IV Stopped (04/23/20 1032)  . insulin 0.4 mL/hr at 04/23/20 1700  . lactated ringers 20 mL/hr at 04/23/20 0800  . lactated ringers    . lactated ringers 20 mL/hr at 04/23/20 1700  . niCARDipine Stopped (04/22/20 2016)  . nitroGLYCERIN 50 mcg/min (04/23/20 1700)  . nitroPRUSSide Stopped (04/23/20 0454)  . phenylephrine (NEO-SYNEPHRINE) Adult infusion 0 mcg/min (04/23/20 0859)     Lab Results  Component Value Date   WBC 14.6 (H) 04/23/2020   HGB 12.0 (L) 04/23/2020   HCT 35.6 (L) 04/23/2020   PLT 125 (L) 04/23/2020   GLUCOSE 119 (H) 04/23/2020   CHOL 143 01/21/2019   TRIG 162.0 (H) 01/21/2019   HDL 44.20 01/21/2019   LDLCALC 66 01/21/2019   ALT 39 04/22/2020   AST 34 04/22/2020   NA 143 04/23/2020   K 4.1 04/23/2020   CL 112 (H) 04/23/2020   CREATININE 0.91 04/23/2020   BUN 17 04/23/2020   CO2 24 04/23/2020   TSH 3.18  06/07/2016   INR 1.4 (H) 04/23/2020   HGBA1C 5.1 01/21/2019   Extubated awake and neuro intact Pulses in feet present    Grace Isaac MD  Beeper [phone] Office [phone] 04/23/2020 6:12 PM

## 2020-04-23 NOTE — Anesthesia Postprocedure Evaluation (Signed)
Anesthesia Post Note  [name]  Procedure(s) Performed: REPAIR OF AORTIC DISSECTION USING HEMASHIELD PLATINUM 30 MM VASCULAR GRAFT. (N/A Chest)     Patient location during evaluation: SICU Anesthesia Type: General Level of consciousness: sedated Pain management: pain level controlled Vital Signs Assessment: post-procedure vital signs reviewed and stable Respiratory status: patient remains intubated per anesthesia plan Cardiovascular status: stable Postop Assessment: no apparent nausea or vomiting Anesthetic complications: no   No complications documented.  Last Vitals:  Vitals:   04/22/20 1902 04/23/20 0439  BP: (!) 145/76   Pulse: 87   Resp: (!) 26   Temp:    SpO2: 99% 99%    Last Pain:  Vitals:   04/22/20 0947  TempSrc:   PainSc: 8                  Lucresia Simic DAVID

## 2020-04-23 NOTE — Procedures (Signed)
Extubation Procedure Note  Patient Details:   Name: Richard Patterson DOB: 07-01-1976 MRN: [id number]   Airway Documentation:    Vent end date: 04/23/20 Vent end time: 1308   Evaluation  O2 sats: stable throughout Complications: No apparent complications Patient did tolerate procedure well. Bilateral Breath Sounds: Clear   Pt extubated to 5L [facility] per rapid wean protocol. NIF was -20 and VC was 9105ml. Pt had positive cuff leak and no stridor noted. RT to continue to monitor.  Vilinda Blanks 04/23/2020, 1:09 PM

## 2020-04-23 NOTE — Op Note (Signed)
CARDIOVASCULAR SURGERY OPERATIVE NOTE  04/23/2020  Surgeon:  Gaye Pollack, MD  First Assistant: Enid Cutter,  PA-C   Preoperative Diagnosis:  Acute type A aortic dissection   Postoperative Diagnosis:  Same   Procedure:  1. Median Sternotomy 2. Extracorporeal circulation 3.   Replacement of the ascending aorta (hemi-arch) using a 30 mm Hemashield graft under deep hypothermic circulatory arrest  Anesthesia:  General Endotracheal   Clinical History/Surgical Indication:  This 44 year old gentleman has an acute type A aortic dissection probably starting yesterday afternoon.  He has remained severely hypertensive in the emergency room throughout the day today.  He has had a lot of pain and numbness in the right lower extremity although it is pink and warm and there is a palpable right femoral pulse.  This pain seems to be fluctuating and I am concerned that it may be related to his aortic dissection.  His aortic dissection will require emergent repair this evening.  We will then reevaluate his lower extremity perfusion after repair of the dissection and decide if any further intervention is needed. I discussed the operative procedure with the patient and family including alternatives, benefits and risks; including but not limited to bleeding, blood transfusion, infection, stroke, myocardial infarction, heart block requiring a permanent pacemaker, organ dysfunction, and death.  Magdalene Patricia understands and agrees to proceed.    Preparation:  The patient was taken directly back to the operating room.  The consent was signed by me. Preoperative antibiotics were given. The patient was taken back to the operating room and positioned supine on the operating room table. After being placed under general endotracheal anesthesia by the anesthesia team a foley catheter was placed.  A pulmonary arterial line and bilateral radial arterial lines were placed by the anesthesia team. The neck,  chest, abdomen, and both legs were prepped with Hibiclens soap and draped in the usual sterile manner. A surgical time-out was taken and the correct patient and operative procedure were confirmed with the nursing and anesthesia staff.  TEE:  Performed by Dr. Lillia Abed. This showed a normal appearing aortic valve with trace central AI. LV systolic function was normal with severe LVH. RV function was normal.   Cardiopulmonary Bypass:  A median sternotomy was performed. The pericardium was opened in the midline. Right ventricular function appeared normal. The ascending aorta was mildly enlarged and had no palpable plaque.   Right axillary artery exposure and cannulation:  A transverse incision was made below the right clavicle. The pectoralis major muscle was split along its fibers and the pectoralis minor muscle was retracted laterally. The brachial plexus was identified and gently retracted laterally to expose the axillary artery. The artery was controlled proximally and distally with vessel loops. The patient was fully heparinized and ACT maintained greater than 400. The axillary artery was clamped proximally and distally with peripheral Debakey clamps. It was opened longitudinally. There was no sign of dissection here. An 8 mm Hemashield dacron graft was anastomosed in an end to side manner using continuous 5-0 prolene suture. CoSeal was applied for hemostasis and the clamp removed. The graft was connected to the arterial end of the bypass circuit. Venous cannulation was performed via the right atrial appendage using a two-staged venous cannula.  A temperature probe was inserted into the interventricular septum and an insulating pad was placed in the pericardium. CO2 was insufflated into the pericardium throughout the case to minimize intracardiac air.    Resection and grafting of ascending aortic aneurysm:  The patient was placed on cardiopulmonary bypass and a left ventricular vent was placed  via the right superior pulmonary vein. Systemic cooling was begun with a goal temperature of 20 degrees centigrade by bladder and rectal temperature probes. A retrograde cardioplegia cannula was placed through the right atrium into the coronary sinus without difficulty.  After 30 minutes of cooling the target temperature of 20 degrees centigrade was reached. Cerebral oximetry was 70% bilaterally.  The patient was given Propofol and 125 mg of Solumedrol. The head was packed in ice. The bed was placed in steep trendelenburg. Circulatory arrest was begun and the blood volume emptied into the venous reservoir. The innominate artery was clamped and continuous antegrade cerebral perfusion was begun. Cold blood retrograde cardioplegia was given and myocardial temperature dropped to 10 degrees centigrade. Additional doses were given at approximately 20 minute intervals throughout the period of circulatory arrest and cross-clamping. Complete diastolic arrest was maintained. The aorta was transected just proximal to the innominate artery beveling the resection out along the undersurface of the aortic arch (Hemiarch replacement). The aortic diameter was measured at 30 mm here. A 30 mm Hemasheild Platinum vascular graft was prepared. ( Catalog # [id number] P0, Lot [id number], SN [id number]). It was anastomosed to the aortic arch in an end to end manner using 3-0 prolene continuous suture with a felt strip placed internally and externally to bring the dissected aortic layers together and reinforce the anastomisis. A light coating of CoSeal was applied to seal needle holes. The clamp was removed from the innominate artery and circulation was slowly resumed.  The aortic graft was cross-clamped and full CPB support was resumed. Circulatory arrest time was 34 minutes with antegrade cerebral perfusion.   Proximal anastomosis:  The dissected ascending aorta was resected down to the sinotubular junction. There was a 3 cm transverse  tear just distal to the STJ. The dissection did not extend proximally more that 1 cm. The coronary ostia were normal. The aortic valve looked normal. The dissected layers in the proximal aorta were approximated with a felt strip in between with bioglue to give the wall some structure. Then the graft was cut to the proper length and anastomosed to the proximal aorta in end to end manner using continuous 3-0 Prolene suture with a felt strip. A vent needle was placed into the ascending graft to de-air. The head was place in trendelenburg position and de-airing maneuvers performed.   Completion:   The patient was rewarmed to 37 degrees Centigrade. The crossclamp was removed with a time of 47 minutes. There was spontaneous return of ventricular fibrillation and he required multiple defibrillations and 3 amps of lidocaine for recurrent VF. He finally maintained sinus rhythm with no ECG changes.  The position of the graft was satisfactory. The vascular anastomoses all appeared hemostatic. Two temporary epicardial pacing wires were placed on the right atrium and two on the right ventricle. The patient was weaned from CPB without difficulty on no inotropic agents CPB time was 152 minutes. Cardiac output was 5 LPM. TEE showed a normal functioning aortic valve with unchanged trace central AI. There was unchanged mild MR. LV function appeared normal. Heparin was fully reversed with protamine and the venous cannula removed. The axillary artery graft was ligated with a 0-Silk tie and suture ligated with a 3-0 Prolene horizontal mattress suture. Hemostasis was achieved. He did require 2 units platelets, 2 units FFP, and 2 units cryo for coagulopathy.  Mediastinal and right pleural drainage tubes were placed. The  sternum was closed with double #6 stainless steel wires. The fascia was closed with continuous # 1 vicryl suture. The subcutaneous tissue was closed with 2-0 vicryl continuous suture. The skin was closed with 3-0  vicryl subcuticular suture. All sponge, needle, and instrument counts were reported correct at the end of the case. Dry sterile dressings were placed over the incisions and around the chest tubes which were connected to pleurevac suction. The patient was then transported to the surgical intensive care unit in critical but stable condition.

## 2020-04-24 ENCOUNTER — Inpatient Hospital Stay (HOSPITAL_COMMUNITY): Payer: Self-pay

## 2020-04-24 LAB — BASIC METABOLIC PANEL
Anion gap: 7 (ref 5–15)
Anion gap: 9 (ref 5–15)
BUN: 18 mg/dL (ref 6–20)
BUN: 15 mg/dL (ref 6–20)
CO2: 24 mmol/L (ref 22–32)
CO2: 26 mmol/L (ref 22–32)
Calcium: 7.5 mg/dL — ABNORMAL LOW (ref 8.9–10.3)
Calcium: 7.9 mg/dL — ABNORMAL LOW (ref 8.9–10.3)
Chloride: 107 mmol/L (ref 98–111)
Chloride: 104 mmol/L (ref 98–111)
Creatinine, Ser: 0.93 mg/dL (ref 0.61–1.24)
Creatinine, Ser: 0.93 mg/dL (ref 0.61–1.24)
GFR calc Af Amer: >60 mL/min (ref >60)
GFR calc Af Amer: >60 mL/min (ref >60)
GFR calc non Af Amer: >60 mL/min (ref >60)
GFR calc non Af Amer: >60 mL/min (ref >60)
Glucose, Bld: 120 mg/dL — ABNORMAL HIGH (ref 70–99)
Glucose, Bld: 129 mg/dL — ABNORMAL HIGH (ref 70–99)
Potassium: 3.6 mmol/L (ref 3.5–5.1)
Potassium: 3.5 mmol/L (ref 3.5–5.1)
Sodium: 138 mmol/L (ref 135–145)
Sodium: 139 mmol/L (ref 135–145)

## 2020-04-24 LAB — PREPARE CRYOPRECIPITATE
Unit division: 0
Unit division: 0

## 2020-04-24 LAB — BPAM CRYOPRECIPITATE
Blood Product Expiration Date: 202109300705
Blood Product Expiration Date: 202109300705
ISSUE DATE / TIME: 202109300125
ISSUE DATE / TIME: 202109300125
Unit Type and Rh: 5100
Unit Type and Rh: 5100

## 2020-04-24 LAB — CBC
HCT: 32.2 % — ABNORMAL LOW (ref 39.0–52.0)
HCT: 31.4 % — ABNORMAL LOW (ref 39.0–52.0)
Hemoglobin: 11 g/dL — ABNORMAL LOW (ref 13.0–17.0)
Hemoglobin: 10.7 g/dL — ABNORMAL LOW (ref 13.0–17.0)
MCH: 31.9 pg (ref 26.0–34.0)
MCH: 31.6 pg (ref 26.0–34.0)
MCHC: 34.2 g/dL (ref 30.0–36.0)
MCHC: 34.1 g/dL (ref 30.0–36.0)
MCV: 93.3 fL (ref 80.0–100.0)
MCV: 92.6 fL (ref 80.0–100.0)
Platelets: 126 10*3/uL — ABNORMAL LOW (ref 150–400)
Platelets: 128 10*3/uL — ABNORMAL LOW (ref 150–400)
RBC: 3.45 MIL/uL — ABNORMAL LOW (ref 4.22–5.81)
RBC: 3.39 MIL/uL — ABNORMAL LOW (ref 4.22–5.81)
RDW: 13 % (ref 11.5–15.5)
RDW: 13.1 % (ref 11.5–15.5)
WBC: 20.8 10*3/uL — ABNORMAL HIGH (ref 4.0–10.5)
WBC: 18.4 10*3/uL — ABNORMAL HIGH (ref 4.0–10.5)
nRBC: 0 % (ref 0.0–0.2)
nRBC: 0 % (ref 0.0–0.2)

## 2020-04-24 LAB — PREPARE FRESH FROZEN PLASMA
Unit division: 0
Unit division: 0

## 2020-04-24 LAB — BPAM PLATELET PHERESIS
Blood Product Expiration Date: 202109302359
Blood Product Expiration Date: 202110012359
ISSUE DATE / TIME: 202109300114
ISSUE DATE / TIME: 202109300114
Unit Type and Rh: 5100
Unit Type and Rh: 6200

## 2020-04-24 LAB — PREPARE PLATELET PHERESIS
Unit division: 0
Unit division: 0

## 2020-04-24 LAB — GLUCOSE, CAPILLARY
Glucose-Capillary: 113 mg/dL — ABNORMAL HIGH (ref 70–99)
Glucose-Capillary: 114 mg/dL — ABNORMAL HIGH (ref 70–99)
Glucose-Capillary: 116 mg/dL — ABNORMAL HIGH (ref 70–99)
Glucose-Capillary: 117 mg/dL — ABNORMAL HIGH (ref 70–99)
Glucose-Capillary: 120 mg/dL — ABNORMAL HIGH (ref 70–99)
Glucose-Capillary: 120 mg/dL — ABNORMAL HIGH (ref 70–99)
Glucose-Capillary: 121 mg/dL — ABNORMAL HIGH (ref 70–99)
Glucose-Capillary: 124 mg/dL — ABNORMAL HIGH (ref 70–99)
Glucose-Capillary: 124 mg/dL — ABNORMAL HIGH (ref 70–99)
Glucose-Capillary: 131 mg/dL — ABNORMAL HIGH (ref 70–99)
Glucose-Capillary: 141 mg/dL — ABNORMAL HIGH (ref 70–99)
Glucose-Capillary: 142 mg/dL — ABNORMAL HIGH (ref 70–99)

## 2020-04-24 LAB — BPAM FFP
Blood Product Expiration Date: 202110032359
Blood Product Expiration Date: 202110032359
ISSUE DATE / TIME: 202109300112
ISSUE DATE / TIME: 202109300112
Unit Type and Rh: 6200
Unit Type and Rh: 6200

## 2020-04-24 LAB — ECHO INTRAOPERATIVE TEE
Height: 73 in
Weight: 4400 oz

## 2020-04-24 LAB — SURGICAL PATHOLOGY

## 2020-04-24 LAB — MAGNESIUM: Magnesium: 2.3 mg/dL (ref 1.7–2.4)

## 2020-04-24 MED ORDER — INSULIN ASPART 100 UNIT/ML ~~LOC~~ SOLN
0.0000 [IU] | SUBCUTANEOUS | Status: DC
  Administered 2020-04-24 (×3): 2 [IU] via SUBCUTANEOUS

## 2020-04-24 MED ORDER — FUROSEMIDE 10 MG/ML IJ SOLN
80.0000 mg | Freq: Two times a day (BID) | INTRAMUSCULAR | Status: AC
  Administered 2020-04-24 – 2020-04-25 (×4): 80 mg via INTRAVENOUS
  Filled 2020-04-24 (×5): qty 8

## 2020-04-24 MED ORDER — INSULIN DETEMIR 100 UNIT/ML ~~LOC~~ SOLN
20.0000 [IU] | Freq: Every day | SUBCUTANEOUS | Status: DC
  Administered 2020-04-24: 20 [IU] via SUBCUTANEOUS
  Filled 2020-04-24 (×3): qty 0.2

## 2020-04-24 MED ORDER — POTASSIUM CHLORIDE CRYS ER 20 MEQ PO TBCR
20.0000 meq | EXTENDED_RELEASE_TABLET | ORAL | Status: DC
  Administered 2020-04-24: 20 meq via ORAL
  Filled 2020-04-24: qty 1

## 2020-04-24 MED ORDER — METOLAZONE 5 MG PO TABS
5.0000 mg | ORAL_TABLET | Freq: Every day | ORAL | Status: AC
  Administered 2020-04-24 – 2020-04-25 (×2): 5 mg via ORAL
  Filled 2020-04-24 (×2): qty 1

## 2020-04-24 MED ORDER — POTASSIUM CHLORIDE CRYS ER 20 MEQ PO TBCR
40.0000 meq | EXTENDED_RELEASE_TABLET | Freq: Two times a day (BID) | ORAL | Status: AC
  Administered 2020-04-24 – 2020-04-25 (×4): 40 meq via ORAL
  Filled 2020-04-24 (×4): qty 2

## 2020-04-24 MED ORDER — LABETALOL HCL 200 MG PO TABS
200.0000 mg | ORAL_TABLET | Freq: Two times a day (BID) | ORAL | Status: DC
  Administered 2020-04-24 – 2020-04-25 (×4): 200 mg via ORAL
  Filled 2020-04-24 (×4): qty 1

## 2020-04-24 MED ORDER — INSULIN DETEMIR 100 UNIT/ML ~~LOC~~ SOLN
20.0000 [IU] | Freq: Every day | SUBCUTANEOUS | Status: DC
  Administered 2020-04-25 – 2020-04-27 (×3): 20 [IU] via SUBCUTANEOUS
  Filled 2020-04-24 (×3): qty 0.2

## 2020-04-24 MED ORDER — LISINOPRIL 20 MG PO TABS
20.0000 mg | ORAL_TABLET | Freq: Every day | ORAL | Status: DC
  Administered 2020-04-24 – 2020-04-25 (×2): 20 mg via ORAL
  Filled 2020-04-24 (×2): qty 1

## 2020-04-24 MED ORDER — ENOXAPARIN SODIUM 40 MG/0.4ML ~~LOC~~ SOLN
40.0000 mg | Freq: Every day | SUBCUTANEOUS | Status: DC
  Administered 2020-04-24 – 2020-04-28 (×5): 40 mg via SUBCUTANEOUS
  Filled 2020-04-24 (×5): qty 0.4

## 2020-04-24 NOTE — Discharge Instructions (Signed)

## 2020-04-24 NOTE — Discharge Summary (Addendum)
Physician Discharge Summary  Patient ID: Richard Patterson MRN: [id number] DOB/AGE: [birth year] 45 y.o.  Admit date: 04/22/2020 Discharge date: 04/29/2020  Admission Diagnoses:  Acute Type A aortic Dissection Hypertension History of polycythemia Obesity, BMI 42   Discharge Diagnoses:   Acute Type A aortic Dissection Hypertension History of polycythemia Obesity, BMI 42 S/P aortic dissection repair  Discharged Condition: good  Hospital Course:   The patient is a 44 year old gentleman with a history of hypertension and smoking who developed sudden onset of chest and back pain yesterday afternoon.  Shortly after this pain started he developed pain going down his right leg that waxed and waned.  He presented to the emergency room and had some lab work done.  He left AGAINST MEDICAL ADVICE before any further work-up could be done.  He said that he had continued pain in his chest back and right leg at home and presented back to the emergency room this morning.  He had a CTA of the chest, abdomen, and pelvis late this afternoon which showed an acute type a aortic dissection beginning at about the level of the sinotubular junction and extending around the aorta down to the left common iliac artery.  The dissection flap enters the superior mesenteric artery with partial occlusion and reconstitution distally.  The celiac trunk and IMA are patent.  There is mild dilatation of the ascending aorta at about 4 cm.  The aortic root appears to be of normal size.  I was not called to see the patient ~6:30 PM.  The patient reports that he has had pain in his back and down into his right leg previously which she thought was sciatica.  He has had a steroid injection in his back several months ago.  He said the pain in his right leg has been going up and down the leg and fluctuating.  He has had some numbness and tingling.  Course in Hospital:  Richard Patterson was prepared and taken to the OR urgently where the type A  aortic dissection was repaired using a 20mm Hemashield straight graft (hemi-arch) under deep hypothermic circulatory arrest. He separated from cardiopulmonary bypass without difficulty. Expected coagulopathy was treated with FFP, cryoprecipitate, and platelet infusions resulting in good hemostasis. He was transferred to the ICU where he remained hemodynamically stable although he continued to be hypertensive requiring IV nitroglycerine and Nipride. He was weaned form the ventilator routinely and extubated without difficulty.  When he awakened, he was found to be neurologically intact and had all peripheral pulses present in all extremities.  He was weaned off IV anti-hypertensives which took several days.  Hie ws started on Labetalol, Lisinopril, and Norvasc. Unfortunately hypertension persisted and clonidine was added. He developed a right foot parasthesia which resolved without intervention.  He was diuresed for volume overload with good response.  He was maintaining NSR and felt stable for transfer to the progressive care unit in stable condition.  He developed some confusion and agitation after transfer with no other neurologic deficits. By the time of his discharge, this had cleared. He was tolerating PO's, having minimal pain, and was independent with mobility at the time of discharge.      Consults: None  Significant Diagnostic Studies:   CTA CHEST FINDINGS  Cardiovascular: Noncontrast imaging demonstrates no intramural hematoma within the ascending, transverse descending thoracic aorta.  Contrast enhanced imaging demonstrates aortic dissection flap originating in the proximal aorta just above the sinus of Valsalva. The dissection flap spirasl through the transverse arch and  enters the descending thoracic aorta.  The dissection flap extends into the abdominal aorta to the bifurcation ultimately extends into the LEFT common iliac artery.  The ascending thoracic aorta is mildly aneurysmal  at 3.8 cm.  Dissection flap does not clearly enter the great vessels. Typical great vessel anatomy.  No mediastinal hematoma. No pericardial fluid. Reconstitute distally.  Mediastinum/Nodes: No mediastinal adenopathy  Lungs/Pleura: .  No acute pulmonary findings.  Musculoskeletal: No acute osseous abnormality.  Review of the MIP images confirms the above findings.  CTA ABDOMEN AND PELVIS FINDINGS  VASCULAR  Aorta: Dissection flap extends through the abdominal aorta.  Celiac: Opacifies normally.  SMA: Defect dissection flap enters the superior mesenteric artery with partial occlusion (image 160/series 6). The arteries appear to reconstitute distally.  Renals: Single LEFT and RIGHT renal arteries opacify. The RIGHT renal artery appears opacified from the the true lumen with the LEFT opacified by the false lumen.  IMA: Patent  Inflow: Normal. Normal caliber of the iliac arteries. RIGHT iliac artery measures  Veins: Normal  Review of the MIP images confirms the above findings.  NON-VASCULAR  Hepatobiliary: No focal hepatic lesion.  Pancreas: Rounded lesion tail the pancreas measuring 2.8 cm. (Image 146/6). Lesion is similar density to the spleen.  Spleen: Normal spleen  Adrenals/Urinary Tract: Kidneys enhance symmetrically ureters and bladder normal  Stomach/Bowel: . Stomach, small bowel, appendix, and cecum are normal. The colon and rectosigmoid colon are normal.  Lymphatic: No adenopathy  Reproductive: Prostate normal  Other: None  Musculoskeletal: No aggressive osseous lesion  Review of the MIP images confirms the above findings.  IMPRESSION: 1. 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 Recommend immediate vascular surgery consultation. 2. Dissection flap extends through the entire the aorta terminating in the LEFT common iliac artery. 3. Dissection flap  enters the superior mesenteric artery with PARTIAL OCCLUSION. 4. No significant aneurysmal dilatation of the aorta. 5. Renal arteries opacify. 6. Celiac trunk and IMA are patent. 7. Rounded lesion at the tail the pancreas either represents a primary pancreatic lesion large splenule. Recommend non emergent contrast MRI of the abdomen for further evaluation.  Treatments:   CARDIOVASCULAR SURGERY OPERATIVE NOTE  04/23/2020  Surgeon:  Gaye Pollack, MD  First Assistant: Enid Cutter,  PA-C   Preoperative Diagnosis:  Acute type A aortic dissection   Postoperative Diagnosis:  Same   Procedure:  1. Median Sternotomy 2. Extracorporeal circulation 3.   Replacement of the ascending aorta (hemi-arch) using a 30 mm Hemashield graft under deep hypothermic circulatory arrest  Discharge Exam: Blood pressure 100/81, pulse 84, temperature 98.2 F (36.8 C), temperature source Oral, resp. rate 20, weight 128.9 kg, SpO2 98 %. General appearance: alert, oriented and cooperative. Neurologic: no focal deficits.  Heart: SR, ST elevations were no evident on 12-lead EKG. Lungs: breath sounds are clear, sats acceptable on RA.  Abdomen: soft, NT. Extremities: All warm and well perfused. No peripheral edema.   Disposition: Discharge disposition: 01-Home or Self Care       Allergies as of 04/29/2020      Reactions   Shellfish Allergy Hives, Rash      Medication List    STOP taking these medications   amLODipine-benazepril 5-20 MG capsule Commonly known as: LOTREL     TAKE these medications   amLODipine 5 MG tablet Commonly known as: NORVASC Take 1 tablet (5 mg total) by mouth daily.   aspirin 325 MG EC tablet Take  1 tablet (325 mg total) by mouth daily.   cloNIDine 0.2 MG tablet Commonly known as: CATAPRES Take 1.5 tablets (0.3 mg total) by mouth 2 (two) times daily.   labetalol 200 MG tablet Commonly known as: NORMODYNE Take 1.5 tablets (300 mg total) by  mouth 2 (two) times daily.   lisinopril 20 MG tablet Commonly known as: ZESTRIL Take 1 tablet (20 mg total) by mouth daily.   traMADol 50 MG tablet Commonly known as: Ultram Take 1 tablet (50 mg total) by mouth every 6 (six) hours as needed for up to 7 days.            Durable Medical Equipment  (From admission, onward)         Start     Ordered   04/28/20 1413  For home use only DME Walker rolling  Once       Question Answer Comment  Walker: With [place] Wheels   Patient needs a walker to treat with the following condition Muscular deconditioning      04/28/20 1412          Follow-up Information    Gaye Pollack, MD. Go on 05/20/2020.   Specialty: Cardiothoracic Surgery Why: Your appointment is on Wednesday, 05/20/20 at 4:30pm. Please arrive 30 minutes early for a chest x-ray to be performed by [facility] located on the first floor of the same building.  Contact information: [address] [place] [address] [phone]        [facility] Follow up.   Why: rolling walker arranged- to be delivered to room prior to discharge Contact information: [address] [place] [address] [phone]        [facility] [place] Follow up.   Specialty: Cardiothoracic Surgery Why: Office will call with appointment for suture removal. Contact information: [address] [place], [place] [phone]       [place], Betty G, MD. Call.   Specialty: Family Medicine Why: Call and make appointment for 2-3 weeks for follow up for blood pressure management Contact information: [place] [address] [phone]              The patient has been discharged on:   1.Beta Blocker:  Yes [ x ]                              No   [   ]                              If No, reason:  2.Ace Inhibitor/ARB: Yes [ x  ]                                      No  [    ]                                     If No, reason:  3.Statin:   Yes [   ]                  No  [ x  ]  If No, reason: no coronary disease  4.Shela CommonsVelta Addison  [ x  ]                  No   [   ]                  If No, reason:     Signed: Antony Odea, PA-C 04/29/2020, 8:41 AM

## 2020-04-24 NOTE — Progress Notes (Signed)
2 Days Post-Op Procedure(s) (LRB): REPAIR OF AORTIC DISSECTION USING HEMASHIELD PLATINUM 30 MM VASCULAR GRAFT. (N/A) Subjective: Says pain under control.  Hypertensive overnight with NTG currently on 100 mcg and Nipride 1 mcg/kg/min.  Objective: Vital signs in last 24 hours: Temp:  [97.5 F (36.4 C)-99.5 F (37.5 C)] 99.3 F (37.4 C) (10/01 0700) Pulse Rate:  [69-98] 93 (10/01 0700) Cardiac Rhythm: Normal sinus rhythm (10/01 0400) Resp:  [16-32] 26 (10/01 0700) BP: (94-136)/(51-81) 121/59 (10/01 0700) SpO2:  [83 %-100 %] 89 % (10/01 0700) Arterial Line BP: (101-149)/(64-83) 110/64 (09/30 1030) FiO2 (%):  [40 %-50 %] 40 % (09/30 1230) Weight:  [146.8 kg] 146.8 kg (10/01 0500)  Hemodynamic parameters for last 24 hours: PAP: (24-43)/(8-25) 36/12 CO:  [3.9 L/min-6.8 L/min] 5.9 L/min CI:  [1.6 L/min/m2-2.7 L/min/m2] 2.4 L/min/m2  Intake/Output from previous day: 09/30 0701 - 10/01 0700 In: 2834 [P.O.:720; I.V.:1471.3; IV Piggyback:642.7] Out: 2412 [Urine:1890; Chest Tube:522] Intake/Output this shift: No intake/output data recorded.  General appearance: alert and cooperative Neurologic: intact Heart: regular rate and rhythm, S1, S2 normal, no murmur Lungs: clear to auscultation bilaterally Extremities: edema moderate, feet warm. Palpable dp and pt on right, pt on left. Wound: dressing dry  Lab Results: Recent Labs    04/23/20 1625 04/24/20 0250  WBC 14.6* 20.8*  HGB 12.0* 11.0*  HCT 35.6* 32.2*  PLT 125* 126*   BMET:  Recent Labs    04/23/20 1625 04/24/20 0250  NA 143 138  K 4.1 3.6  CL 112* 107  CO2 24 24  GLUCOSE 119* 120*  BUN 17 18  CREATININE 0.91 0.93  CALCIUM 7.7* 7.5*    PT/INR:  Recent Labs    04/23/20 0822  LABPROT 16.5*  INR 1.4*   ABG    Component Value Date/Time   PHART 7.374 04/23/2020 1441   HCO3 24.3 04/23/2020 1441   TCO2 26 04/23/2020 1441   ACIDBASEDEF 1.0 04/23/2020 1441   O2SAT 91.0 04/23/2020 1441   CBG (last 3)   Recent Labs    04/24/20 0246 04/24/20 0430 04/24/20 0550  GLUCAP 113* 131* 124*   CXR: ok  Assessment/Plan: S/P Procedure(s) (LRB): REPAIR OF AORTIC DISSECTION USING HEMASHIELD PLATINUM 30 MM VASCULAR GRAFT. (N/A)  POD 1 Hypertensive overnight requiring increased NTG and nipride. Will start po labetalol and lisinopril and wean nipride, NTG. Goal MAP 70-80.   Volume excess: did not diurese that much yesterday with lasix 40 bid so will increase to 80 bid and add metolazone for 2 days. Replace K+  DM: No hx of DM and Hgb A1c in 12/2018 was 5.1. He received steroids for circ arrest. Will start Levemir and SSI and dc insulin drip.  DC chest tubes, swan DC arterial line when off nipride.  IS, OOB, ambulate as tolerated.   LOS: 2 days    Gaye Pollack 04/24/2020

## 2020-04-24 NOTE — Progress Notes (Signed)
      [address]       [place] [address]             [phone]      POD # 2 repair aortic dissection  BP 104/68   Pulse (!) 104   Temp 99.4 F (37.4 C)   Resp (!) 32   Wt (!) 146.8 kg   SpO2 95%   BMI 42.70 kg/m  6l [place] 90% Off nipride, NTG at 80 mcg/min Pulled out IJ Creatinine 0.93, K= 3.5, Hct= 31  Supplement K  Richard Bentivegna C. Roxan Hockey, MD [facility] [phone]

## 2020-04-25 ENCOUNTER — Inpatient Hospital Stay (HOSPITAL_COMMUNITY): Payer: Self-pay

## 2020-04-25 LAB — HEMOGLOBIN A1C
Hgb A1c MFr Bld: 5.2 % (ref 4.8–5.6)
Mean Plasma Glucose: 102.54 mg/dL

## 2020-04-25 LAB — CBC
HCT: 32.7 % — ABNORMAL LOW (ref 39.0–52.0)
Hemoglobin: 11.2 g/dL — ABNORMAL LOW (ref 13.0–17.0)
MCH: 32.5 pg (ref 26.0–34.0)
MCHC: 34.3 g/dL (ref 30.0–36.0)
MCV: 94.8 fL (ref 80.0–100.0)
Platelets: 120 10*3/uL — ABNORMAL LOW (ref 150–400)
RBC: 3.45 MIL/uL — ABNORMAL LOW (ref 4.22–5.81)
RDW: 13 % (ref 11.5–15.5)
WBC: 17.2 10*3/uL — ABNORMAL HIGH (ref 4.0–10.5)
nRBC: 0 % (ref 0.0–0.2)

## 2020-04-25 LAB — BASIC METABOLIC PANEL
Anion gap: 9 (ref 5–15)
BUN: 14 mg/dL (ref 6–20)
CO2: 27 mmol/L (ref 22–32)
Calcium: 8.1 mg/dL — ABNORMAL LOW (ref 8.9–10.3)
Chloride: 102 mmol/L (ref 98–111)
Creatinine, Ser: 0.97 mg/dL (ref 0.61–1.24)
GFR calc Af Amer: >60 mL/min (ref >60)
GFR calc non Af Amer: >60 mL/min (ref >60)
Glucose, Bld: 116 mg/dL — ABNORMAL HIGH (ref 70–99)
Potassium: 3.4 mmol/L — ABNORMAL LOW (ref 3.5–5.1)
Sodium: 138 mmol/L (ref 135–145)

## 2020-04-25 LAB — GLUCOSE, CAPILLARY
Glucose-Capillary: 110 mg/dL — ABNORMAL HIGH (ref 70–99)
Glucose-Capillary: 117 mg/dL — ABNORMAL HIGH (ref 70–99)
Glucose-Capillary: 149 mg/dL — ABNORMAL HIGH (ref 70–99)
Glucose-Capillary: 111 mg/dL — ABNORMAL HIGH (ref 70–99)
Glucose-Capillary: 134 mg/dL — ABNORMAL HIGH (ref 70–99)

## 2020-04-25 MED ORDER — POTASSIUM CHLORIDE CRYS ER 20 MEQ PO TBCR
40.0000 meq | EXTENDED_RELEASE_TABLET | Freq: Once | ORAL | Status: AC
  Administered 2020-04-25: 40 meq via ORAL
  Filled 2020-04-25: qty 2

## 2020-04-25 MED ORDER — AMLODIPINE BESYLATE 10 MG PO TABS
10.0000 mg | ORAL_TABLET | Freq: Every day | ORAL | Status: DC
  Administered 2020-04-25 – 2020-04-28 (×4): 10 mg via ORAL
  Filled 2020-04-25 (×4): qty 1

## 2020-04-25 MED ORDER — POTASSIUM CHLORIDE CRYS ER 20 MEQ PO TBCR
20.0000 meq | EXTENDED_RELEASE_TABLET | ORAL | Status: DC
  Administered 2020-04-25: 20 meq via ORAL
  Filled 2020-04-25: qty 1

## 2020-04-25 MED ORDER — INSULIN ASPART 100 UNIT/ML ~~LOC~~ SOLN
0.0000 [IU] | Freq: Three times a day (TID) | SUBCUTANEOUS | Status: DC
  Administered 2020-04-25 – 2020-04-28 (×2): 2 [IU] via SUBCUTANEOUS

## 2020-04-25 NOTE — Progress Notes (Signed)
3 Days Post-Op Procedure(s) (LRB): REPAIR OF AORTIC DISSECTION USING HEMASHIELD PLATINUM 30 MM VASCULAR GRAFT. (N/A) Subjective: C/o incisional pain Still has pain on plantar aspect right foot  Objective: Vital signs in last 24 hours: Temp:  [97.6 F (36.4 C)-99.4 F (37.4 C)] 98.3 F (36.8 C) (10/02 0738) Pulse Rate:  [78-104] 87 (10/02 0700) Cardiac Rhythm: Normal sinus rhythm (10/02 0400) Resp:  [16-34] 23 (10/02 0515) BP: (80-145)/(43-131) 122/75 (10/02 0700) SpO2:  [89 %-100 %] 97 % (10/02 0700) Weight:  [137.3 kg] 137.3 kg (10/02 0500)  Hemodynamic parameters for last 24 hours:    Intake/Output from previous day: 10/01 0701 - 10/02 0700 In: 1064.8 [P.O.:240; I.V.:725; IV Piggyback:99.9] Out: 4665 [Urine:4665] Intake/Output this shift: No intake/output data recorded.  General appearance: alert and anxious Neurologic: intact Heart: regular rate and rhythm Lungs: diminished breath sounds bibasilar Abdomen: obese, + BS Extremities: extremities normal, atraumatic, no cyanosis or edema and 2+ Dp and PT bilaterally  Lab Results: Recent Labs    04/24/20 1622 04/25/20 0112  WBC 18.4* 17.2*  HGB 10.7* 11.2*  HCT 31.4* 32.7*  PLT 128* 120*   BMET:  Recent Labs    04/24/20 1622 04/25/20 0112  NA 139 138  K 3.5 3.4*  CL 104 102  CO2 26 27  GLUCOSE 129* 116*  BUN 15 14  CREATININE 0.93 0.97  CALCIUM 7.9* 8.1*    PT/INR:  Recent Labs    04/23/20 0822  LABPROT 16.5*  INR 1.4*   ABG    Component Value Date/Time   PHART 7.374 04/23/2020 1441   HCO3 24.3 04/23/2020 1441   TCO2 26 04/23/2020 1441   ACIDBASEDEF 1.0 04/23/2020 1441   O2SAT 91.0 04/23/2020 1441   CBG (last 3)  Recent Labs    04/24/20 2342 04/25/20 0416 04/25/20 0624  GLUCAP 120* 110* 117*    Assessment/Plan: S/P Procedure(s) (LRB): REPAIR OF AORTIC DISSECTION USING HEMASHIELD PLATINUM 30 MM VASCULAR GRAFT. (N/A) -Overall doing well POD # 3 CV- still on nitroglycerin drip  On  labetalol, lisinopril, will add amlodipine  Wean NTG RESP- IS for left lower lobe atelectasis RENAL- creatinine and lytes stable  Continue diuresis ENDO- CBG well controlled, change to [facility] and HS SCD + enoxaparin for DVT prophylaxis Mobilize    LOS: 3 days    Melrose Nakayama 04/25/2020

## 2020-04-25 NOTE — Progress Notes (Signed)
      [address]       [place] [address]             [phone]      Up in chair  BP (!) 157/89   Pulse 85   Temp (!) 97.4 F (36.3 C)   Resp (!) 24   Wt (!) 137.3 kg   SpO2 96%   BMI 39.94 kg/m   Intake/Output Summary (Last 24 hours) at 04/25/2020 1802 Last data filed at 04/25/2020 1700 Gross per 24 hour  Intake 313.27 ml  Output 4940 ml  Net -4626.73 ml   Diuresed well with IV lasix  Remo Lipps C. Roxan Hockey, MD [facility] [phone]

## 2020-04-26 ENCOUNTER — Inpatient Hospital Stay (HOSPITAL_COMMUNITY): Payer: Self-pay

## 2020-04-26 LAB — BPAM RBC
Blood Product Expiration Date: 202110302359
Blood Product Expiration Date: 202110302359
Blood Product Expiration Date: 202110302359
Blood Product Expiration Date: 202110302359
Blood Product Expiration Date: 202110302359
Blood Product Expiration Date: 202110302359
ISSUE DATE / TIME: 202109292059
ISSUE DATE / TIME: 202109292059
ISSUE DATE / TIME: 202109292059
ISSUE DATE / TIME: 202109301046
Unit Type and Rh: 5100
Unit Type and Rh: 5100
Unit Type and Rh: 5100
Unit Type and Rh: 5100
Unit Type and Rh: 5100
Unit Type and Rh: 5100

## 2020-04-26 LAB — TYPE AND SCREEN
ABO/RH(D): O POS
Antibody Screen: NEGATIVE
Unit division: 0
Unit division: 0
Unit division: 0
Unit division: 0
Unit division: 0
Unit division: 0

## 2020-04-26 LAB — GLUCOSE, CAPILLARY
Glucose-Capillary: 85 mg/dL (ref 70–99)
Glucose-Capillary: 103 mg/dL — ABNORMAL HIGH (ref 70–99)
Glucose-Capillary: 106 mg/dL — ABNORMAL HIGH (ref 70–99)
Glucose-Capillary: 97 mg/dL (ref 70–99)

## 2020-04-26 MED ORDER — LABETALOL HCL 5 MG/ML IV SOLN: 20.0000 mg | INTRAVENOUS | Status: DC | PRN

## 2020-04-26 MED ORDER — POTASSIUM CHLORIDE ER 10 MEQ PO TBCR
20.0000 meq | EXTENDED_RELEASE_TABLET | Freq: Every day | ORAL | Status: DC
  Administered 2020-04-26 – 2020-04-28 (×3): 20 meq via ORAL
  Filled 2020-04-26 (×6): qty 2

## 2020-04-26 MED ORDER — FUROSEMIDE 40 MG PO TABS
40.0000 mg | ORAL_TABLET | Freq: Every day | ORAL | Status: DC
  Administered 2020-04-26 – 2020-04-28 (×3): 40 mg via ORAL
  Filled 2020-04-26 (×3): qty 1

## 2020-04-26 MED ORDER — LABETALOL HCL 300 MG PO TABS
300.0000 mg | ORAL_TABLET | Freq: Two times a day (BID) | ORAL | Status: DC
  Administered 2020-04-26 – 2020-04-28 (×6): 300 mg via ORAL
  Filled 2020-04-26 (×7): qty 1

## 2020-04-26 MED ORDER — LISINOPRIL 40 MG PO TABS
40.0000 mg | ORAL_TABLET | Freq: Every day | ORAL | Status: DC
  Administered 2020-04-26 – 2020-04-28 (×3): 40 mg via ORAL
  Filled 2020-04-26: qty 2
  Filled 2020-04-26: qty 1
  Filled 2020-04-26: qty 2

## 2020-04-26 NOTE — Progress Notes (Signed)
4 Days Post-Op Procedure(s) (LRB): REPAIR OF AORTIC DISSECTION USING HEMASHIELD PLATINUM 30 MM VASCULAR GRAFT. (N/A) Subjective: Feels better today  Objective: Vital signs in last 24 hours: Temp:  [97.4 F (36.3 C)-99.8 F (37.7 C)] 99.8 F (37.7 C) (10/03 0700) Pulse Rate:  [74-96] 80 (10/03 0600) Cardiac Rhythm: Normal sinus rhythm (10/02 2000) Resp:  [6-27] 16 (10/03 0600) BP: (102-164)/(46-152) 126/64 (10/03 0600) SpO2:  [88 %-99 %] 93 % (10/03 0600) Weight:  [138.4 kg] 138.4 kg (10/03 0200)  Hemodynamic parameters for last 24 hours:    Intake/Output from previous day: 10/02 0701 - 10/03 0700 In: 370 [P.O.:240; I.V.:130] Out: 4885 [Urine:4885] Intake/Output this shift: No intake/output data recorded.  General appearance: alert, cooperative and no distress Neurologic: intact Heart: regular rate and rhythm Lungs: diminished breath sounds bibasilar Abdomen: normal findings: soft, non-tender  Lab Results: Recent Labs    04/24/20 1622 04/25/20 0112  WBC 18.4* 17.2*  HGB 10.7* 11.2*  HCT 31.4* 32.7*  PLT 128* 120*   BMET:  Recent Labs    04/24/20 1622 04/25/20 0112  NA 139 138  K 3.5 3.4*  CL 104 102  CO2 26 27  GLUCOSE 129* 116*  BUN 15 14  CREATININE 0.93 0.97  CALCIUM 7.9* 8.1*    PT/INR: No results for input(s): LABPROT, INR in the last 72 hours. ABG    Component Value Date/Time   PHART 7.374 04/23/2020 1441   HCO3 24.3 04/23/2020 1441   TCO2 26 04/23/2020 1441   ACIDBASEDEF 1.0 04/23/2020 1441   O2SAT 91.0 04/23/2020 1441   CBG (last 3)  Recent Labs    04/25/20 1515 04/25/20 2022 04/26/20 0704  GLUCAP 111* 134* 85    Assessment/Plan: S/P Procedure(s) (LRB): REPAIR OF AORTIC DISSECTION USING HEMASHIELD PLATINUM 30 MM VASCULAR GRAFT. (N/A) -CV- in SR  Hypertension- still on IV NTG  Increase labetalol, lisinopril, dc IV NTG RESP- Left lower lobe atelectasis- IS RENAL- no labs this AM  Continue diuresis ENDO= CBG reasonably well  controlled   Continue AC HS SSI DVT prophylaxis- SCD + enoxaparin Ambulate   LOS: 4 days    Richard Patterson 04/26/2020

## 2020-04-26 NOTE — Plan of Care (Signed)
  Problem: Activity: Goal: Risk for activity intolerance will decrease Outcome: Progressing   Problem: Cardiac: Goal: Will achieve and/or maintain hemodynamic stability Outcome: Progressing   Problem: Respiratory: Goal: Respiratory status will improve Outcome: Progressing   Problem: Urinary Elimination: Goal: Ability to achieve and maintain adequate renal perfusion and functioning will improve Outcome: Progressing   

## 2020-04-26 NOTE — Progress Notes (Signed)
      [address]       [place] [address]             [phone]      POD # 4  BP (!) 119/46   Pulse 81   Temp 98.4 F (36.9 C)   Resp 16   Wt (!) 138.4 kg   SpO2 93%   BMI 40.26 kg/m    Intake/Output Summary (Last 24 hours) at 04/26/2020 1825 Last data filed at 04/26/2020 1800 Gross per 24 hour  Intake 129.96 ml  Output 4605 ml  Net -4475.04 ml    Patient noncompliant with mobility  Katherene Dinino C. Roxan Hockey, MD [facility] [phone]

## 2020-04-27 LAB — CBC
HCT: 35.7 % — ABNORMAL LOW (ref 39.0–52.0)
Hemoglobin: 12 g/dL — ABNORMAL LOW (ref 13.0–17.0)
MCH: 32.1 pg (ref 26.0–34.0)
MCHC: 33.6 g/dL (ref 30.0–36.0)
MCV: 95.5 fL (ref 80.0–100.0)
Platelets: 171 10*3/uL (ref 150–400)
RBC: 3.74 MIL/uL — ABNORMAL LOW (ref 4.22–5.81)
RDW: 13.1 % (ref 11.5–15.5)
WBC: 11.7 10*3/uL — ABNORMAL HIGH (ref 4.0–10.5)
nRBC: 0 % (ref 0.0–0.2)

## 2020-04-27 LAB — GLUCOSE, CAPILLARY
Glucose-Capillary: 99 mg/dL (ref 70–99)
Glucose-Capillary: 100 mg/dL — ABNORMAL HIGH (ref 70–99)
Glucose-Capillary: 100 mg/dL — ABNORMAL HIGH (ref 70–99)
Glucose-Capillary: 100 mg/dL — ABNORMAL HIGH (ref 70–99)
Glucose-Capillary: 115 mg/dL — ABNORMAL HIGH (ref 70–99)

## 2020-04-27 LAB — BASIC METABOLIC PANEL
Anion gap: 9 (ref 5–15)
BUN: 24 mg/dL — ABNORMAL HIGH (ref 6–20)
CO2: 31 mmol/L (ref 22–32)
Calcium: 8.7 mg/dL — ABNORMAL LOW (ref 8.9–10.3)
Chloride: 97 mmol/L — ABNORMAL LOW (ref 98–111)
Creatinine, Ser: 1.06 mg/dL (ref 0.61–1.24)
GFR calc Af Amer: >60 mL/min (ref >60)
GFR calc non Af Amer: >60 mL/min (ref >60)
Glucose, Bld: 93 mg/dL (ref 70–99)
Potassium: 3.7 mmol/L (ref 3.5–5.1)
Sodium: 137 mmol/L (ref 135–145)

## 2020-04-27 MED ORDER — SODIUM CHLORIDE 0.9% FLUSH: 3.0000 mL | INTRAVENOUS | Status: DC | PRN

## 2020-04-27 MED ORDER — POTASSIUM CHLORIDE CRYS ER 20 MEQ PO TBCR
20.0000 meq | EXTENDED_RELEASE_TABLET | ORAL | Status: AC
  Administered 2020-04-27 (×3): 20 meq via ORAL
  Filled 2020-04-27 (×3): qty 1

## 2020-04-27 MED ORDER — SODIUM CHLORIDE 0.9% FLUSH
3.0000 mL | Freq: Two times a day (BID) | INTRAVENOUS | Status: DC
  Administered 2020-04-27 (×2): 3 mL via INTRAVENOUS

## 2020-04-27 MED ORDER — ~~LOC~~ CARDIAC SURGERY, PATIENT & FAMILY EDUCATION: Freq: Once | Status: AC

## 2020-04-27 MED ORDER — CLONIDINE HCL 0.2 MG PO TABS
0.3000 mg | ORAL_TABLET | Freq: Two times a day (BID) | ORAL | Status: DC
  Administered 2020-04-27 – 2020-04-28 (×4): 0.3 mg via ORAL
  Filled 2020-04-27 (×5): qty 1

## 2020-04-27 MED ORDER — SODIUM CHLORIDE 0.9 % IV SOLN: 250.0000 mL | INTRAVENOUS | Status: DC | PRN

## 2020-04-27 NOTE — Progress Notes (Signed)
Pt arrived from unit from 2 heart . VSS.Will continue to monitor. Pt. Oriented to unit   Taylinn Brabant K Wallace Gappa, RN  

## 2020-04-27 NOTE — Progress Notes (Signed)
5 Days Post-Op Procedure(s) (LRB): REPAIR OF AORTIC DISSECTION USING HEMASHIELD PLATINUM 30 MM VASCULAR GRAFT. (N/A) Subjective: Ambulated about 100' this AM Right foot paresthesias resolved   Objective: Vital signs in last 24 hours: Temp:  [97.8 F (36.6 C)-99.2 F (37.3 C)] 98.6 F (37 C) (10/04 0700) Pulse Rate:  [67-88] 83 (10/04 0700) Cardiac Rhythm: Normal sinus rhythm (10/04 0400) Resp:  [11-30] 30 (10/04 0700) BP: (99-152)/(46-90) 142/87 (10/04 0500) SpO2:  [89 %-99 %] 98 % (10/04 0700) Weight:  [134.3 kg] 134.3 kg (10/04 0500)  Hemodynamic parameters for last 24 hours:    Intake/Output from previous day: 10/03 0701 - 10/04 0700 In: 56.4 [I.V.:56.4] Out: 1995 [Urine:1995] Intake/Output this shift: No intake/output data recorded.  A&O x 3 No distress Lungs decreased bases L>R Cardiac RRR Wound intact Ext + mild edema  Lab Results: Recent Labs    04/25/20 0112 04/27/20 0138  WBC 17.2* 11.7*  HGB 11.2* 12.0*  HCT 32.7* 35.7*  PLT 120* 171   BMET:  Recent Labs    04/25/20 0112 04/27/20 0138  NA 138 137  K 3.4* 3.7  CL 102 97*  CO2 27 31  GLUCOSE 116* 93  BUN 14 24*  CREATININE 0.97 1.06  CALCIUM 8.1* 8.7*    PT/INR: No results for input(s): LABPROT, INR in the last 72 hours. ABG    Component Value Date/Time   PHART 7.374 04/23/2020 1441   HCO3 24.3 04/23/2020 1441   TCO2 26 04/23/2020 1441   ACIDBASEDEF 1.0 04/23/2020 1441   O2SAT 91.0 04/23/2020 1441   CBG (last 3)  Recent Labs    04/26/20 1530 04/26/20 2154 04/27/20 0653  GLUCAP 106* 97 99    Assessment/Plan: S/P Procedure(s) (LRB): REPAIR OF AORTIC DISSECTION USING HEMASHIELD PLATINUM 30 MM VASCULAR GRAFT. (N/A) Plan for transfer to step-down: see transfer orders  CV- in SR  Hypertension remains difficult to control- will add clonidine RESP- IS for atelectasis RENAL- creatinine and lytes Ok  Continue diuresis ENDO- CBG normal SCD + enoxaparin Tolerating regular  diet Continue cardiac rehab   LOS: 5 days    Richard Patterson 04/27/2020

## 2020-04-27 NOTE — Progress Notes (Signed)
CARDIAC REHAB PHASE I   Offered to walk with pt. Pt declines at this time. Will continue to follow and encourage ambulation.  Rufina Falco, RN BSN 04/27/2020 3:17 PM

## 2020-04-27 NOTE — Progress Notes (Signed)
EVENING ROUNDS NOTE :     [address]       [place] [address]             [phone]                 5 Days Post-Op Procedure(s) (LRB): REPAIR OF AORTIC DISSECTION USING HEMASHIELD PLATINUM 30 MM VASCULAR GRAFT. (N/A)   Total Length of Stay:  LOS: 5 days  Events:   Transferring 4 E     BP 111/62   Pulse 75   Temp 98.3 F (36.8 C) (Oral)   Resp (!) 23   Wt 134.3 kg   SpO2 95%   BMI 39.06 kg/m      FiO2 (%):  [21 %] 21 %  . sodium chloride      I/O last 3 completed shifts: In: 186.4 [I.V.:186.4] Out: 3705 [EXBMW:4132]   CBC Latest Ref Rng & Units 04/27/2020 04/25/2020 04/24/2020  WBC 4.0 - 10.5 K/uL 11.7(H) 17.2(H) 18.4(H)  Hemoglobin 13.0 - 17.0 g/dL 12.0(L) 11.2(L) 10.7(L)  Hematocrit 39 - 52 % 35.7(L) 32.7(L) 31.4(L)  Platelets 150 - 400 K/uL 171 120(L) 128(L)    BMP Latest Ref Rng & Units 04/27/2020 04/25/2020 04/24/2020  Glucose 70 - 99 mg/dL 93 116(H) 129(H)  BUN 6 - 20 mg/dL 24(H) 14 15  Creatinine 0.61 - 1.24 mg/dL 1.06 0.97 0.93  Sodium 135 - 145 mmol/L 137 138 139  Potassium 3.5 - 5.1 mmol/L 3.7 3.4(L) 3.5  Chloride 98 - 111 mmol/L 97(L) 102 104  CO2 22 - 32 mmol/L 31 27 26   Calcium 8.9 - 10.3 mg/dL 8.7(L) 8.1(L) 7.9(L)    ABG    Component Value Date/Time   PHART 7.374 04/23/2020 1441   PCO2ART 41.6 04/23/2020 1441   PO2ART 64 (L) 04/23/2020 1441   HCO3 24.3 04/23/2020 1441   TCO2 26 04/23/2020 1441   ACIDBASEDEF 1.0 04/23/2020 1441   O2SAT 91.0 04/23/2020 1441       Melodie Bouillon, MD 04/27/2020 5:48 PM

## 2020-04-28 ENCOUNTER — Inpatient Hospital Stay (HOSPITAL_COMMUNITY): Payer: Self-pay

## 2020-04-28 LAB — BASIC METABOLIC PANEL
Anion gap: 14 (ref 5–15)
BUN: 27 mg/dL — ABNORMAL HIGH (ref 6–20)
CO2: 24 mmol/L (ref 22–32)
Calcium: 8.8 mg/dL — ABNORMAL LOW (ref 8.9–10.3)
Chloride: 100 mmol/L (ref 98–111)
Creatinine, Ser: 0.96 mg/dL (ref 0.61–1.24)
GFR calc Af Amer: >60 mL/min (ref >60)
GFR calc non Af Amer: >60 mL/min (ref >60)
Glucose, Bld: 92 mg/dL (ref 70–99)
Potassium: 3.5 mmol/L (ref 3.5–5.1)
Sodium: 138 mmol/L (ref 135–145)

## 2020-04-28 LAB — GLUCOSE, CAPILLARY
Glucose-Capillary: 127 mg/dL — ABNORMAL HIGH (ref 70–99)
Glucose-Capillary: 101 mg/dL — ABNORMAL HIGH (ref 70–99)
Glucose-Capillary: 127 mg/dL — ABNORMAL HIGH (ref 70–99)
Glucose-Capillary: 106 mg/dL — ABNORMAL HIGH (ref 70–99)

## 2020-04-28 MED ORDER — LISINOPRIL 10 MG PO TABS
20.0000 mg | ORAL_TABLET | Freq: Every day | ORAL | Status: DC
  Filled 2020-04-28: qty 2

## 2020-04-28 MED ORDER — AMLODIPINE BESYLATE 5 MG PO TABS
5.0000 mg | ORAL_TABLET | Freq: Every day | ORAL | Status: DC
  Administered 2020-04-29: 5 mg via ORAL
  Filled 2020-04-28: qty 1

## 2020-04-28 MED ORDER — SODIUM CHLORIDE 0.9 % IV BOLUS
250.0000 mL | Freq: Once | INTRAVENOUS | Status: AC
  Administered 2020-04-28: 250 mL via INTRAVENOUS

## 2020-04-28 MED ORDER — POTASSIUM CHLORIDE ER 10 MEQ PO TBCR
20.0000 meq | EXTENDED_RELEASE_TABLET | Freq: Four times a day (QID) | ORAL | Status: AC
  Administered 2020-04-28 (×2): 20 meq via ORAL
  Filled 2020-04-28 (×4): qty 2

## 2020-04-28 NOTE — Progress Notes (Addendum)
      [address]       [place] [address]             [phone]      6 Days Post-Op Procedure(s) (LRB): REPAIR OF AORTIC DISSECTION USING HEMASHIELD PLATINUM 30 MM VASCULAR GRAFT. (N/A) Subjective:  Became confused and pulled out his IV and took off his heart monitor this morning. Refused to have the CXR.  He was sleeping when I arrived to see him and awakened easily. Remains confused but was cooperative with exam.   Objective: Vital signs in last 24 hours: Temp:  [98.2 F (36.8 C)-98.8 F (37.1 C)] 98.2 F (36.8 C) (10/05 0631) Pulse Rate:  [66-88] 82 (10/05 0631) Cardiac Rhythm: Normal sinus rhythm;Bundle branch block (10/04 1900) Resp:  [18-24] 20 (10/05 0631) BP: (103-147)/(49-123) 147/74 (10/05 0631) SpO2:  [90 %-97 %] 94 % (10/05 0631) FiO2 (%):  [21 %] 21 % (10/04 1448) Weight:  [945 kg] 129 kg (10/05 0500)    Intake/Output from previous day: 10/04 0701 - 10/05 0700 In: 450 [P.O.:450] Out: 400 [Urine:400] Intake/Output this shift: No intake/output data recorded.  General appearance: alert, cooperative and confused Neurologic: no focal deficits.  Heart: SR, has ST elevations but this is not new.  Lungs: breath sounds are clear, sats OK on RA.  Abdomen: soft, NT. Extremities: All warm and well perfused. No peripheral edema.   Lab Results: Recent Labs    04/27/20 0138  WBC 11.7*  HGB 12.0*  HCT 35.7*  PLT 171   BMET:  Recent Labs    04/27/20 0138 04/28/20 0109  NA 137 138  K 3.7 3.5  CL 97* 100  CO2 31 24  GLUCOSE 93 92  BUN 24* 27*  CREATININE 1.06 0.96  CALCIUM 8.7* 8.8*    PT/INR: No results for input(s): LABPROT, INR in the last 72 hours. ABG    Component Value Date/Time   PHART 7.374 04/23/2020 1441   HCO3 24.3 04/23/2020 1441   TCO2 26 04/23/2020 1441   ACIDBASEDEF 1.0 04/23/2020 1441   O2SAT 91.0 04/23/2020 1441   CBG (last 3)  Recent Labs    04/27/20 1607 04/27/20 1815 04/27/20 2219  GLUCAP 100* 100*  115*    Assessment/Plan: S/P Procedure(s) (LRB): REPAIR OF AORTIC DISSECTION USING HEMASHIELD PLATINUM 30 MM VASCULAR GRAFT. (N/A)  -POD6  Emergency repair of Type A aortic dissection. Has been progressing well and generally cooperative but has been confused and not accepting care this morning.  He is getting close to being ready to discharge but we need to give him time for his confusion to clear.  D/C pacer wires this AM. CXR ;ater today.  -Hypertension- control reasonable on multiple agents. No changes for now.   -DVT PPX- continue enoxaparin.   LOS: 6 days    Antony Odea, [place] [phone] 04/28/2020 Patient seen and examined, agree with above Dc pacing wires Possibly home later today if confusion improves  Remo Lipps C. Roxan Hockey, MD [facility] [phone]

## 2020-04-28 NOTE — Progress Notes (Signed)
Pt BP 85/45 (53) after AM medications. Pt complains of a little bit of dizziness. Jadene Pierini PA paged and order for 250cc bolus received. Will administer and continue to monitor.  Clyde Canterbury, RN

## 2020-04-28 NOTE — Progress Notes (Signed)
EPW removed per order.  Tips intact, pt tolerated well.  Educated on bedrest.  Will continue to monitor.

## 2020-04-28 NOTE — Progress Notes (Signed)
CARDIAC REHAB PHASE I   PRE:  Rate/Rhythm: 75 SR  BP:  Sitting: 116/60      MODE:  Ambulation: 120 ft   POST:  Rate/Rhythm: 91 SR  BP:  Sitting: 114/52   Pt ambulated 197ft in hallway assist of one with front wheel walker. Pt denies pain, SOB, or dizziness. Pt impulsive needing consistent reminders of sternal precautions. Pt disappointed he is not d/cing today. Encouraged ambulation and IS use. Will continue to follow.  [phone] Rufina Falco, RN BSN 04/28/2020 2:50 PM '

## 2020-04-28 NOTE — Progress Notes (Signed)
Pt refusing insulin and meds at this time.  Educated on importance and pt stated "I do not need medications".

## 2020-04-28 NOTE — Progress Notes (Signed)
   04/28/20 0544  Provider Notification  Provider Name/Title Dr. Kipp Brood  Date Provider Notified 04/28/20  Time Provider Notified [phone]  Notification Type Call  Notification Reason  (pt. pulled out IV,heart monitor, refusing care, upset)  Response  (MD aware No new orders)  Date of Provider Response 04/28/20  Time of Provider Response 0544   Patient being NONCOMPLIANT. Refusing care and Upset. Refused to insert NEW IV line. He's threatening to leave before the Doctor even see him today. MD made aware.

## 2020-04-28 NOTE — Progress Notes (Signed)
Mobility Specialist - Progress Note   04/28/20 1625  Mobility  Activity Refused mobility    Pt became angry about having to adhere to sternal precautions, stating he will not ambulate if he had to to adhere to them. Further mobility was differed. RN was present, bed alarm on.    Pricilla Handler Mobility Specialist Mobility Specialist Phone: [phone]

## 2020-04-28 NOTE — Progress Notes (Signed)
RN responded to bed alarm in room. Pt was stating that he wanted to leave the hospital. Pt removed IV from L hand and called his friend to come pick him. Jadene Pierini PA called and came to bedside to talk to patient. Patient agreed to stay another night and wear his heart monitor.   Clyde Canterbury, RN

## 2020-04-28 NOTE — Progress Notes (Signed)
Per Jadene Pierini, ok for patient to not have an IV at this time.

## 2020-04-28 NOTE — Plan of Care (Signed)
  Problem: Education: Goal: Knowledge of General Education information will improve Description: Including pain rating scale, medication(s)/side effects and non-pharmacologic comfort measures Outcome: Progressing   Problem: Clinical Measurements: Goal: Will remain free from infection Outcome: Progressing   Problem: Clinical Measurements: Goal: Respiratory complications will improve Outcome: Progressing   Problem: Clinical Measurements: Goal: Cardiovascular complication will be avoided Outcome: Progressing   Problem: Elimination: Goal: Will not experience complications related to bowel motility Outcome: Progressing   Problem: Pain Managment: Goal: General experience of comfort will improve Outcome: Progressing   Problem: Safety: Goal: Ability to remain free from injury will improve Outcome: Progressing   Problem: Skin Integrity: Goal: Risk for impaired skin integrity will decrease Outcome: Progressing   Problem: Cardiac: Goal: Will achieve and/or maintain hemodynamic stability Outcome: Progressing   Problem: Skin Integrity: Goal: Wound healing without signs and symptoms of infection Outcome: Progressing

## 2020-04-29 LAB — BASIC METABOLIC PANEL
Anion gap: 10 (ref 5–15)
BUN: 28 mg/dL — ABNORMAL HIGH (ref 6–20)
CO2: 25 mmol/L (ref 22–32)
Calcium: 8.6 mg/dL — ABNORMAL LOW (ref 8.9–10.3)
Chloride: 104 mmol/L (ref 98–111)
Creatinine, Ser: 1.07 mg/dL (ref 0.61–1.24)
GFR calc non Af Amer: >60 mL/min (ref >60)
Glucose, Bld: 103 mg/dL — ABNORMAL HIGH (ref 70–99)
Potassium: 4 mmol/L (ref 3.5–5.1)
Sodium: 139 mmol/L (ref 135–145)

## 2020-04-29 LAB — GLUCOSE, CAPILLARY: Glucose-Capillary: 89 mg/dL (ref 70–99)

## 2020-04-29 MED ORDER — LABETALOL HCL 300 MG PO TABS
300.0000 mg | ORAL_TABLET | Freq: Two times a day (BID) | ORAL | 3 refills | Status: DC
Start: 2020-04-29 — End: 2020-04-29

## 2020-04-29 MED ORDER — CLONIDINE HCL 0.3 MG PO TABS: 0.3000 mg | ORAL_TABLET | Freq: Two times a day (BID) | ORAL | 11 refills | Status: DC

## 2020-04-29 MED ORDER — LISINOPRIL 20 MG PO TABS
20.0000 mg | ORAL_TABLET | Freq: Every day | ORAL | 3 refills | Status: DC
Start: 2020-04-29 — End: 2020-05-20

## 2020-04-29 MED ORDER — LABETALOL HCL 200 MG PO TABS: 300.0000 mg | ORAL_TABLET | Freq: Two times a day (BID) | ORAL | 3 refills | Status: DC

## 2020-04-29 MED ORDER — CLONIDINE HCL 0.2 MG PO TABS: 0.3000 mg | ORAL_TABLET | Freq: Two times a day (BID) | ORAL | 3 refills | Status: DC

## 2020-04-29 MED ORDER — ASPIRIN 325 MG PO TBEC
325.0000 mg | DELAYED_RELEASE_TABLET | Freq: Every day | ORAL | 0 refills | Status: DC
Start: 2020-04-29 — End: 2020-05-20

## 2020-04-29 MED ORDER — AMLODIPINE BESYLATE 5 MG PO TABS
5.0000 mg | ORAL_TABLET | Freq: Every day | ORAL | 3 refills | Status: DC
Start: 2020-04-29 — End: 2020-05-20

## 2020-04-29 MED ORDER — TRAMADOL HCL 50 MG PO TABS: 50.0000 mg | ORAL_TABLET | Freq: Four times a day (QID) | ORAL | 0 refills | Status: AC | PRN

## 2020-04-29 NOTE — Progress Notes (Signed)
Pt ambulated in room and hall way 235 feet with walker and standby assisted with a  staff nurse. He's well tolerated. Denied SOB, hemodynamics stable, sinus rhythm on monitor, HR 90s, BP with in normal limits, SPO2 97-100 % on room. He remained afebrile.  Pt's alert and oriented x 3, tearfully stated " I want to go home to see my 45 years old daughter today. I feel tired to stay in the hospital."  He cried while calling his wife and his daughter by phone.. He appeared more forgetful, irritated and agitated sometimes when staff approached him for routine nursing care. Emotional support given.  We will continue to monitor.  Kennyth Lose, RN

## 2020-04-29 NOTE — Progress Notes (Addendum)
      [address]       [place] [address]             [phone]      7 Days Post-Op Procedure(s) (LRB): REPAIR OF AORTIC DISSECTION USING HEMASHIELD PLATINUM 30 MM VASCULAR GRAFT. (N/A) Subjective:  Awake, alert, calm, and cooperative. He is very apologetic about his behavior yesterday.  No new concerns overnight. He walked several times since yesterday morning and has continued to improve.  Remains on RA.   Objective: Vital signs in last 24 hours: Temp:  [97.7 F (36.5 C)-99 F (37.2 C)] 98.8 F (37.1 C) (10/06 0427) Pulse Rate:  [72-91] 91 (10/06 0427) Cardiac Rhythm: Normal sinus rhythm (10/05 2007) Resp:  [17-30] 21 (10/06 0427) BP: (81-141)/(45-77) 116/62 (10/06 0427) SpO2:  [96 %-100 %] 100 % (10/06 0427) Weight:  [128.9 kg] 128.9 kg (10/06 0427)  Intake/Output from previous day: 10/05 0701 - 10/06 0700 In: 990.2 [P.O.:490; I.V.:500.2] Out: 4 [Urine:3; Stool:1] Intake/Output this shift: No intake/output data recorded.  General appearance: alert, oriented and cooperative. Neurologic: no focal deficits.  Heart: SR, ST elevations were no evident on 12-lead EKG. Lungs: breath sounds are clear, sats acceptable on RA.  Abdomen: soft, NT. Extremities: All warm and well perfused. No peripheral edema.   Lab Results: Recent Labs    04/27/20 0138  WBC 11.7*  HGB 12.0*  HCT 35.7*  PLT 171   BMET:  Recent Labs    04/28/20 0109 04/29/20 0556  NA 138 139  K 3.5 4.0  CL 100 104  CO2 24 25  GLUCOSE 92 103*  BUN 27* 28*  CREATININE 0.96 1.07  CALCIUM 8.8* 8.6*    PT/INR: No results for input(s): LABPROT, INR in the last 72 hours. ABG    Component Value Date/Time   PHART 7.374 04/23/2020 1441   HCO3 24.3 04/23/2020 1441   TCO2 26 04/23/2020 1441   ACIDBASEDEF 1.0 04/23/2020 1441   O2SAT 91.0 04/23/2020 1441   CBG (last 3)  Recent Labs    04/28/20 1623 04/28/20 2148 04/29/20 0633  GLUCAP 127* 106* 89    Assessment/Plan: S/P  Procedure(s) (LRB): REPAIR OF AORTIC DISSECTION USING HEMASHIELD PLATINUM 30 MM VASCULAR GRAFT. (N/A)  -POD7  Emergency repair of Type A aortic dissection. Has been progressing well and generally cooperative. Had confusion yesterday and pulled out IV, refused care.  Mental status has cleared and he is calm and cooperative. I believe it is save for him to discharge to home. He was carefully instructed regarding the importance of careful blood pressure control and follow up.    -Hypertension- SBP ~85 after AM medications yesterday. Lasix was stopped and oral agents adjusted. BP well controlled since then. Discharge on: Amlodipine 5mg  po daily Clonidine 0.2mg  po BID Labetolol 200mg  po BID Lisinopril 20mg  po daily    LOS: 7 days    Antony Odea, PA-C [phone] 04/29/2020

## 2020-04-29 NOTE — Progress Notes (Signed)
Discharge instructions provided to patient. All medications, discharge instructions, and follow up appointments reviewed. Monitor off CCMD notified. Discharging home with wife.  Era Bumpers, RN

## 2020-04-29 NOTE — Progress Notes (Signed)
CARDIAC REHAB PHASE I   Brief d/c ed done with pt. Left in-the-tube sheet along with heart healthy diet and smoking cessation tip sheet. Reviewed site care. Encouraged ambulation and IS use. Pt anxious to d/c. RN made aware.   [phone] Rufina Falco, RN BSN 04/29/2020 8:43 AM

## 2020-04-30 ENCOUNTER — Telehealth: Payer: Self-pay | Admitting: Family Medicine

## 2020-04-30 NOTE — Telephone Encounter (Signed)
Transition Care Management Unsuccessful Follow-up Telephone Call  Date of discharge and from where:  04/29/2020 from [facility]  Attempts:  1st Attempt  Reason for unsuccessful TCM follow-up call:  Unable to leave message

## 2020-05-04 ENCOUNTER — Telehealth: Payer: Self-pay

## 2020-05-04 ENCOUNTER — Other Ambulatory Visit: Payer: Self-pay

## 2020-05-04 ENCOUNTER — Ambulatory Visit (INDEPENDENT_AMBULATORY_CARE_PROVIDER_SITE_OTHER): Payer: Self-pay

## 2020-05-04 DIAGNOSIS — Z4802 Encounter for removal of sutures: Secondary | ICD-10-CM

## 2020-05-04 NOTE — Progress Notes (Signed)
Patient arrived for nurse visit to remove suture/staples post- procedure Repair of aortic dissection with Dr. Cyndia Bent 04/22/20.  He was discharged 04/29/20. Three  Sutures removed with no signs/ symptoms of infection noted.  Patient tolerated procedure well.  Incisions well approximated after sutures removed.  Patient/ friend instructed to keep the incision sites clean and dry and to notify of any signs/ symptoms of infection.   Spoke with patient and friend for some time over questions and concerns.  Patient was tearful when stating that he was having trouble sleeping at night and it was affecting his overall health.  Advised patient that he could take over the counter Benadryl and pain medication at bedtime to help aid with sleep and sleep where ever is most comfortable with him at this time.  He also stated that he is taking his medications as prescribed and was feeling lightheaded and dizzy at times.  Patient's blood pressure check at appointment 88/55, and 90/58.  Will speak with PA, Enid Cutter for possible medication adjustment.  Also advised patient to get a blood pressure machine from pharmacy to check his blood pressures daily.  Patient stated that he had a decrease in appetite, advised that he snack throughout the day and eat food that are of interest to him.  Also, gave patient an incentive spirometer and taught how to use, patient stated that he would take it home and use it.  Patient's friend asked about patient's confusion, advised that it will get better over time.  At appointment, patient did seem to know what he was talking about with occasional "fuzziness".  No concern at this point.  Patient was unsure of his follow-up appointments, went over his follow-up appointments in detail and advised that the office would arrange an appointment for Cardiology.  He acknowledged receipt.  Also advised patient that he could also take Tylenol for pain in between taking the tramadol.  He was aware.    Patient/ friend acknowledged instructions given. Advised to call the office if any questions or concerns arise.

## 2020-05-04 NOTE — Telephone Encounter (Signed)
-----   Message from Laury Deep, RN sent at 05/04/2020  1:06 PM EDT ----- Caryl Pina, can you call him with his new appt date/time info?  Thanks, Ryan ----- Message ----- From: Candis Schatz Sent: 05/04/2020   1:04 PM EDT To: Laury Deep, RN  Please and thanks Trish ----- Message ----- From: Laury Deep, RN Sent: 05/04/2020   1:02 PM EDT To: Tana Coast, RN  Awesome, do we need to call him with this??  Thanks so much!  Ryan ----- Message ----- From: Candis Schatz Sent: 05/04/2020  12:59 PM EDT To: Laury Deep, RN  I made him an appt to see Dr. Oval Linsey in our [facility] clinic 10/22 @9 :30 am ([facility])  Thanks Trish ----- Message ----- From: Laury Deep, RN Sent: 05/04/2020  10:59 AM EDT To: Tana Coast, RN  Trish,   Can you please help [place] get this patient of Dr. Vivi Martens an appt with cardiologist?  He was a dissection a few weeks ago and needed a cardiologist.  He is on 4 B/p meds and we will need help managing that.  Thanks so much,  [employer]

## 2020-05-04 NOTE — Telephone Encounter (Signed)
Attempted to contacted patient and patient's girlfriend, Angus Palms for medication changes and new Cardiology appt update.  No answer and no voicemail to leave a message for call back.  Will try again later.

## 2020-05-08 ENCOUNTER — Telehealth: Payer: Self-pay | Admitting: *Deleted

## 2020-05-08 ENCOUNTER — Other Ambulatory Visit: Payer: Self-pay | Admitting: *Deleted

## 2020-05-08 ENCOUNTER — Other Ambulatory Visit: Payer: Self-pay

## 2020-05-08 DIAGNOSIS — Z9889 Other specified postprocedural states: Secondary | ICD-10-CM

## 2020-05-08 DIAGNOSIS — G8918 Other acute postprocedural pain: Secondary | ICD-10-CM

## 2020-05-08 MED ORDER — TRAMADOL HCL 50 MG PO TABS: 50.0000 mg | ORAL_TABLET | Freq: Four times a day (QID) | ORAL | 0 refills | Status: DC | PRN

## 2020-05-08 NOTE — Telephone Encounter (Signed)
Mr. Klasen called reporting the inability to sleep, achy pain in his left chest, and numbness and tingling down his legs. Pt states he tried benadryl and melatonin, non of which helped him sleep. Joellyn Rued, PA contacted and recommends pt take 50mg  of benadryl with tramadol before bed to see if that would help. Harriet Pho also consulted Dr. Cyndia Bent who states the leg numbness and tingling was present during his hospitalization. An office appointment was scheduled for further assessment of the pt for Monday the 18th. Pt was instructed to call the office over the weekend if further issues arise. Pt acknowledges advise and is aware of appt with a PA. All questions and concerns answered at this time.

## 2020-05-11 ENCOUNTER — Other Ambulatory Visit: Payer: Self-pay

## 2020-05-11 ENCOUNTER — Ambulatory Visit (INDEPENDENT_AMBULATORY_CARE_PROVIDER_SITE_OTHER): Payer: Self-pay | Admitting: Physician Assistant

## 2020-05-11 VITALS — BP 150/89 | HR 71 | Temp 97.7°F | Resp 18 | Ht 73.0 in | Wt 283.8 lb

## 2020-05-11 DIAGNOSIS — Z9889 Other specified postprocedural states: Secondary | ICD-10-CM

## 2020-05-11 MED ORDER — OXYCODONE HCL 5 MG PO TABS: 5.0000 mg | ORAL_TABLET | Freq: Four times a day (QID) | ORAL | 0 refills | Status: DC | PRN

## 2020-05-11 MED ORDER — PANTOPRAZOLE SODIUM 40 MG PO TBEC: 40.0000 mg | DELAYED_RELEASE_TABLET | Freq: Every day | ORAL | 0 refills | Status: DC

## 2020-05-11 MED ORDER — GABAPENTIN 300 MG PO CAPS: 300.0000 mg | ORAL_CAPSULE | Freq: Two times a day (BID) | ORAL | 0 refills | Status: DC

## 2020-05-11 NOTE — Progress Notes (Signed)
HPI:  Patient presents to office today with multiple complaints since leaving hospital after an aortic dissection repair.  He states overall he isn't doing well.  He continues to have pain across his chest.  He also states that he isn't sleeping mostly due to pain and inability to get comfortable.  He states that if he lays on his right or left side the pain is terrible.  He has tried to take benadryl and Melatonin without much success.  He also complains of pain/tingling down his legs.  This has been present since surgery, but he notes at times it is very uncomfortable and he has to get up from the couch.  He isn't very active currently, but does get up and go to the bathroom.  He states he has a lot of stairs and his wife is working from home but cant be outside with him when he needs to walk.  He also complains of reflux.  He states that he is eating tums several times per day and states his symptoms are not related to food intake.  Finally he was constipated, but took Milk of Magnesia with relief.  Current Outpatient Medications  Medication Sig Dispense Refill  . amLODipine (NORVASC) 5 MG tablet Take 1 tablet (5 mg total) by mouth daily. 30 tablet 3  . aspirin EC 325 MG EC tablet Take 1 tablet (325 mg total) by mouth daily. 30 tablet 0  . cloNIDine (CATAPRES) 0.2 MG tablet Take 1.5 tablets (0.3 mg total) by mouth 2 (two) times daily. (Patient taking differently: Take 0.3 mg by mouth daily. ) 30 tablet 3  . labetalol (NORMODYNE) 200 MG tablet Take 1.5 tablets (300 mg total) by mouth 2 (two) times daily. 60 tablet 3  . lisinopril (ZESTRIL) 20 MG tablet Take 1 tablet (20 mg total) by mouth daily. 30 tablet 3  . traMADol (ULTRAM) 50 MG tablet Take 1 tablet (50 mg total) by mouth every 6 (six) hours as needed for severe pain. 27 tablet 0  . gabapentin (NEURONTIN) 300 MG capsule Take 1 capsule (300 mg total) by mouth 2 (two) times daily. 60 capsule 0  . oxyCODONE (OXY IR/ROXICODONE) 5 MG immediate release  tablet Take 1 tablet (5 mg total) by mouth every 6 (six) hours as needed for severe pain. 30 tablet 0  . pantoprazole (PROTONIX) 40 MG tablet Take 1 tablet (40 mg total) by mouth daily. 30 tablet 0   No current facility-administered medications for this visit.    Physical Exam:  BP (!) 150/89 (BP Location: Left Arm, Patient Position: Sitting, Cuff Size: Large)   Pulse 71   Temp 97.7 F (36.5 C) (Skin)   Resp 18   Ht 6\' 1"  (1.854 m)   Wt 283 lb 12.8 oz (128.7 kg)   SpO2 97% Comment: RA with mask on  BMI 37.44 kg/m   Gen: no apparent distress Heart: RRR Lungs: CTA bilaterally Ext: trace edema, good pulses present Incisions: clean and dry  A/P:  Mr. Chagoya is S/P Repair of an aortic dissection.  He is experiencing sternal discomfort which is not unexpected with the type of surgery he had.  Ultram is not providing relief, will give a short course of Oxycodone to see if patient can get relief.  However, he was instructed that he may experience pain for several months.  Activity wise patient was instructed that unfortunately he has to be up moving around.  Walking to the bathroom doesn't count as walking for the day.  He was instructed he must be up moving around 3x per day.  He was instructed to ambulate until he gets tired then he can rest and perform 2 more times per day.  The patient was instructed that this ambulation can consist of walking laps around the interior of his house.  In regards to heartburn he was given an RX for protonix to see if this provide relief.  I suspect his lower extremity discomfort is in part due to his dissection.  He previously took Neurontin which has hasn't been taking since surgery.  I have resumed this medicine for BID dosing.  In regards to sleeping patient was instructed to try and avoid all naps during the day as this will make sleeping at night difficult.  He was instructed to use over the counter sleep medication as needed.  However, I told patient the  more he is up moving around the more likely he will be tired and able to get more rest at night.  Finally on exam today the patients BP was elevated.  He missed his dose of morning medications.  I stressed the importance of medication compliance as he doesn't want to risk disrupting his repaired aorta.  He will follow up with Dr. Cyndia Bent on 10/27 as scheduled and we can see if patient is doing better at that time.   Ellwood Handler, PA-C    Ellwood Handler, PA-C [facility] [phone]

## 2020-05-15 ENCOUNTER — Ambulatory Visit: Payer: Self-pay | Admitting: Cardiovascular Disease

## 2020-05-19 ENCOUNTER — Other Ambulatory Visit: Payer: Self-pay | Admitting: Physician Assistant

## 2020-05-20 ENCOUNTER — Other Ambulatory Visit: Payer: Self-pay

## 2020-05-20 ENCOUNTER — Ambulatory Visit (INDEPENDENT_AMBULATORY_CARE_PROVIDER_SITE_OTHER): Payer: Self-pay | Admitting: Surgery

## 2020-05-20 ENCOUNTER — Ambulatory Visit
Admission: RE | Admit: 2020-05-20 | Discharge: 2020-05-20 | Disposition: A | Discharge status: Home / Self Care | Payer: Self-pay | Source: Ambulatory Visit | Attending: Surgery | Admitting: Surgery

## 2020-05-20 ENCOUNTER — Encounter: Payer: Self-pay | Admitting: Surgery

## 2020-05-20 ENCOUNTER — Other Ambulatory Visit: Payer: Self-pay | Admitting: Surgery

## 2020-05-20 VITALS — BP 136/82 | HR 82 | Resp 20 | Wt 284.4 lb

## 2020-05-20 DIAGNOSIS — Z9889 Other specified postprocedural states: Secondary | ICD-10-CM

## 2020-05-20 MED ORDER — ASPIRIN 325 MG PO TBEC
325.0000 mg | DELAYED_RELEASE_TABLET | Freq: Every day | ORAL | 3 refills | Status: DC
Start: 2020-05-20 — End: 2021-06-17

## 2020-05-20 MED ORDER — LISINOPRIL 20 MG PO TABS
20.0000 mg | ORAL_TABLET | Freq: Every day | ORAL | 3 refills | Status: DC
Start: 2020-05-20 — End: 2021-01-19

## 2020-05-20 MED ORDER — LABETALOL HCL 200 MG PO TABS: 300.0000 mg | ORAL_TABLET | Freq: Two times a day (BID) | ORAL | 3 refills | Status: DC

## 2020-05-20 MED ORDER — CLONIDINE HCL 0.2 MG PO TABS
0.3000 mg | ORAL_TABLET | Freq: Two times a day (BID) | ORAL | 3 refills | Status: DC
Start: 2020-05-20 — End: 2021-01-19

## 2020-05-20 MED ORDER — GABAPENTIN 300 MG PO CAPS
300.0000 mg | ORAL_CAPSULE | Freq: Two times a day (BID) | ORAL | 3 refills | Status: DC
Start: 2020-05-20 — End: 2020-10-13

## 2020-05-20 MED ORDER — PANTOPRAZOLE SODIUM 40 MG PO TBEC: 40.0000 mg | DELAYED_RELEASE_TABLET | Freq: Every day | ORAL | 3 refills | Status: AC

## 2020-05-20 MED ORDER — AMLODIPINE BESYLATE 5 MG PO TABS
5.0000 mg | ORAL_TABLET | Freq: Every day | ORAL | 3 refills | Status: DC
Start: 2020-05-20 — End: 2021-01-19

## 2020-05-21 NOTE — Progress Notes (Signed)
HPI: Patient returns for routine postoperative follow-up having undergone replacement of the ascending aorta for acute type A aortic dissection on 04/23/2020. The patient's early postoperative recovery while in the hospital was notable for an uncomplicated postoperative course.  He was hypertensive postop and was transitioned from IV to p.o antihypertensives. Since hospital discharge the patient reports that he has been feeling better.  He was seen by one of our PAs in the office on 05/11/2020 and was continued to complain of a lot of pain and tingling down his legs which he was complaining of prior to surgery.  I thought this might be due to interruption of his lower extremity vasculature by the aortic dissection but he had strong palpable pulses in both lower extremities.  He said that he had some of the symptoms previously which may be due to some degenerative spine disease.  Since he was last seen in our office the symptoms have significantly improved.  He is walking daily without chest pain or shortness of breath.  He has continued to take all of his medications but said that he ran out today.   Current Outpatient Medications  Medication Sig Dispense Refill  . oxyCODONE (OXY IR/ROXICODONE) 5 MG immediate release tablet Take 1 tablet (5 mg total) by mouth every 6 (six) hours as needed for severe pain. 30 tablet 0  . traMADol (ULTRAM) 50 MG tablet Take 1 tablet (50 mg total) by mouth every 6 (six) hours as needed for severe pain. 27 tablet 0  . amLODipine (NORVASC) 5 MG tablet Take 1 tablet (5 mg total) by mouth daily. 30 tablet 3  . aspirin 325 MG EC tablet Take 1 tablet (325 mg total) by mouth daily. 30 tablet 3  . cloNIDine (CATAPRES) 0.2 MG tablet Take 1.5 tablets (0.3 mg total) by mouth 2 (two) times daily. 60 tablet 3  . gabapentin (NEURONTIN) 300 MG capsule Take 1 capsule (300 mg total) by mouth 2 (two) times daily. 60 capsule 3  . labetalol (NORMODYNE) 200 MG tablet Take 1.5 tablets  (300 mg total) by mouth 2 (two) times daily. 60 tablet 3  . lisinopril (ZESTRIL) 20 MG tablet Take 1 tablet (20 mg total) by mouth daily. 30 tablet 3  . pantoprazole (PROTONIX) 40 MG tablet Take 1 tablet (40 mg total) by mouth daily. 30 tablet 3   No current facility-administered medications for this visit.    Physical Exam: BP 136/82 (BP Location: Left Arm, Patient Position: Sitting, Cuff Size: Normal)   Pulse 82   Resp 20   Wt 284 lb 6.4 oz (129 kg)   SpO2 97% Comment: RA  BMI 37.52 kg/m  He looks well. Cardiac exam shows regular rate and rhythm with normal heart sounds.  There is no murmur. Lungs are clear. The chest incision is healing well and the sternum is stable.  The right infraclavicular incision is healing well. There is a strong radial pulse bilaterally. There is no peripheral edema.   Diagnostic Tests:  CLINICAL DATA:  Status post aortic dissection repair  EXAM: CHEST - 2 VIEW  COMPARISON:  04/28/2020, CT 04/22/2020  FINDINGS: Post sternotomy changes. Small left greater than right pleural effusions. Streaky left base opacity, probably atelectasis. No pneumothorax is seen. Mild cardiomegaly.  IMPRESSION: Small left greater than right pleural effusions with basilar atelectasis on the left. Stable cardiomegaly.   Electronically Signed   By: Donavan Foil M.D.   On: 05/20/2020 15:49   Impression:  Overall I think he  is doing very well 1 month following surgery for acute type A aortic dissection.  We refilled his antihypertensive medications today and encouraged him to continue taking them as directed.  I stressed the importance of continued good blood pressure control and preventing recurrent aortic dissection.  I told him he could return to driving a car but should refrain lifting anything heavier than 10 pounds for 3 months postoperatively.  I run to see him back in 1 month with a CTA of the chest, abdomen, and pelvis to get a new baseline following  his aortic dissection.  He will require continued follow-up for this in the future.  He has not seen cardiology yet because he missed his first appointment.  He does have follow-up with cardiology in December 2021.  Plan:  He will return to see me in 1 month with a CTA of the chest, abdomen, and pelvis.   Gaye Pollack, MD [facility] [phone]

## 2020-05-22 ENCOUNTER — Telehealth (INDEPENDENT_AMBULATORY_CARE_PROVIDER_SITE_OTHER): Payer: Self-pay | Admitting: Family Medicine

## 2020-05-22 ENCOUNTER — Encounter: Payer: Self-pay | Admitting: Family Medicine

## 2020-05-22 VITALS — Ht 73.0 in

## 2020-05-22 DIAGNOSIS — I10 Essential (primary) hypertension: Secondary | ICD-10-CM

## 2020-05-22 DIAGNOSIS — F330 Major depressive disorder, recurrent, mild: Secondary | ICD-10-CM

## 2020-05-22 DIAGNOSIS — Z9889 Other specified postprocedural states: Secondary | ICD-10-CM

## 2020-05-22 MED ORDER — CITALOPRAM HYDROBROMIDE 20 MG PO TABS: ORAL_TABLET | ORAL | 1 refills | Status: DC

## 2020-05-22 NOTE — Progress Notes (Signed)
Virtual Visit via Video Note I connected with Richard Patterson on 05/22/20 by a video enabled telemedicine application and verified that I am speaking with the correct person using two identifiers.  Location patient: home Location provider:work office Persons participating in the virtual visit: patient, provider  I discussed the limitations of evaluation and management by telemedicine and the availability of in person appointments. The patient expressed understanding and agreed to proceed.   HPI: Richard Patterson is a 44 year old male following on recent hospitalization. He was hospitalized from 04/22/2020 to 04/29/2020. He presented to the ER complaining of sudden onset of chest and back pain the day before.  He had blood work done, he left before he was [place]" before any further work-up could be done. He went back to the ER because of persistent chest  Pain.  Chest CTA of chest, abdomen, and pelvis showed an acute type a aortic dissection beginning at about the level of the sinotubular junction and extending around the aorta down to the left common iliac artery.  The dissection flap entered the superior mesenteric artery with partial occlusion and reconstitution distally.The celiac trunk and IMA are patent. There is mild dilatation of the ascending aorta at about 4 cm.   Initially he thought back pain was related to sciatic pain because she was evaluated to right lower extremity and in the past he has had epidural injections.  He was having some numbness and tingling.  He was taken to the OR urgently, where the type a aortic dissection was repaired using a 30 mm Hemashield straight graft (hemiarch) on the deep hypovolemic circulatory arrest.  He was transferred to the ICU, postoperatively he received IV nitroglycerin in and Nipride to manage persistent hypertension. He denies checking BP at home, he does not have a BP monitor. Currently he is on labetalol 200 mg 1.5 tablet twice  daily, lisinopril 20 mg daily, clonidine 0.2 mg tablet 1/2 tablet twice daily, and amlodipine 5 mg daily.  Lab Results  Component Value Date   CREATININE 1.07 04/29/2020   BUN 28 (H) 04/29/2020   NA 139 04/29/2020   K 4.0 04/29/2020   CL 104 04/29/2020   CO2 25 04/29/2020    He is complaining of chest wall pain, exacerbated by lying down on his back and by movement. He was prescribed tramadol and oxycodone by thoracic surgeon, he took 2 to 3 tablets because it was not helping with pain, he ran out of medication earlier.  He is also complaining about depressed mood. In the past he has had mild episodes of depression intermittently. He has not been on antihypertensive medication. He states that occasionally he feels like he will be better off dead but he has not suicidal ideation or a plan. He feels bad because his girlfriend has to assist with ADLs. He stays most of the time sitting in a chair.  ROS: See pertinent positives and negatives per HPI.  Past Medical History:  Diagnosis Date  . Essential hypertension     Past Surgical History:  Procedure Laterality Date  . AORTIC VALVE REPAIR N/A 04/22/2020   Procedure: REPAIR OF AORTIC DISSECTION USING HEMASHIELD PLATINUM 30 MM VASCULAR GRAFT.;  Surgeon: Gaye Pollack, MD;  Location: [facility];  Service: Open Heart Surgery;  Laterality: N/A;  Axillary cannulation  . CHOLECYSTECTOMY    . HAND TENDON SURGERY Right     Family History  Problem Relation Age of Onset  . Hypertension Mother   . Hypertension Father   .  Cancer Maternal Grandmother   . Cancer Maternal Grandfather   . Heart attack Neg Hx   . Heart failure Neg Hx     Social History   Socioeconomic History  . Marital status: Significant Other    Spouse name: Not on file  . Number of children: Not on file  . Years of education: Not on file  . Highest education level: Not on file  Occupational History  . Not on file  Tobacco Use  . Smoking status: Former Smoker     Packs/day: 0.50    Types: Cigarettes  . Smokeless tobacco: Never Used  Substance and Sexual Activity  . Alcohol use: No  . Drug use: No  . Sexual activity: Not Currently    Birth control/protection: None  Other Topics Concern  . Not on file  Social History Narrative  . Not on file   Social Determinants of Health   Financial Resource Strain:   . Difficulty of Paying Living Expenses: Not on file  Food Insecurity:   . Worried About Charity fundraiser in the Last Year: Not on file  . Ran Out of Food in the Last Year: Not on file  Transportation Needs:   . Lack of Transportation (Medical): Not on file  . Lack of Transportation (Non-Medical): Not on file  Physical Activity:   . Days of Exercise per Week: Not on file  . Minutes of Exercise per Session: Not on file  Stress:   . Feeling of Stress : Not on file  Social Connections:   . Frequency of Communication with Friends and Family: Not on file  . Frequency of Social Gatherings with Friends and Family: Not on file  . Attends Religious Services: Not on file  . Active Member of Clubs or Organizations: Not on file  . Attends Archivist Meetings: Not on file  . Marital Status: Not on file  Intimate Partner Violence:   . Fear of Current or Ex-Partner: Not on file  . Emotionally Abused: Not on file  . Physically Abused: Not on file  . Sexually Abused: Not on file    Current Outpatient Medications:  .  amLODipine (NORVASC) 5 MG tablet, Take 1 tablet (5 mg total) by mouth daily., Disp: 30 tablet, Rfl: 3 .  aspirin 325 MG EC tablet, Take 1 tablet (325 mg total) by mouth daily., Disp: 30 tablet, Rfl: 3 .  cloNIDine (CATAPRES) 0.2 MG tablet, Take 1.5 tablets (0.3 mg total) by mouth 2 (two) times daily., Disp: 60 tablet, Rfl: 3 .  gabapentin (NEURONTIN) 300 MG capsule, Take 1 capsule (300 mg total) by mouth 2 (two) times daily., Disp: 60 capsule, Rfl: 3 .  labetalol (NORMODYNE) 200 MG tablet, Take 1.5 tablets (300 mg total) by  mouth 2 (two) times daily., Disp: 60 tablet, Rfl: 3 .  lisinopril (ZESTRIL) 20 MG tablet, Take 1 tablet (20 mg total) by mouth daily., Disp: 30 tablet, Rfl: 3 .  pantoprazole (PROTONIX) 40 MG tablet, Take 1 tablet (40 mg total) by mouth daily., Disp: 30 tablet, Rfl: 3 .  citalopram (CELEXA) 20 MG tablet, Start 1/2 tab daily for 1 week then increase to the whole tab., Disp: 30 tablet, Rfl: 1  EXAM:  VITALS per patient if applicable:Ht 6\' 1"  (1.854 m)   BMI 37.52 kg/m   GENERAL: alert, oriented, appears well and in no acute distress  HEENT: atraumatic, conjunctiva clear, no obvious abnormalities on inspection of external nose and ears  NECK: normal movements  of the head and neck  LUNGS: on inspection no signs of respiratory distress, breathing rate appears normal, no obvious gross SOB, gasping or wheezing  CV: no obvious cyanosis  MS: moves all visible extremities without noticeable abnormality  Skin: Wound mid sternal healing well.  PSYCH/NEURO: pleasant and cooperative, speech and thought processing grossly intact. Labile and anxious.  ASSESSMENT AND PLAN:  Discussed the following assessment and plan:  Essential hypertension Continue Lisinopril 20 mg daily,Clonidin 0.2 mg daily, Labetalol 300 mg bid,and Amlodipine 5 mg daily. Recommend monitoring BP at home. Low salt diet.  Mild episode of recurrent major depressive disorder ([place]) - Plan: citalopram (CELEXA) 20 MG tablet We discussed treatment options. He agrees with taking Celexa 20 mg daily, instructed to take 1/2 tab for a few days and increase to 20 mg daily. Some side effects discussed. Instructed about warning signs. CBT may also help.  S/P aortic dissection repair Recovering as expected. Explained that pain can last a few more weeks. Continue with Tylenol 500 mg 3-4 times per day. Avoid shallow breathing and incentive spirometry.    I discussed the assessment and treatment plan with the patient. Richard Patterson was  provided an opportunity to ask questions and all were answered. He agreed with the plan and demonstrated an understanding of the instructions.  Return in about 4 weeks (around 06/19/2020) for Depression..   Rozelle Caudle Martinique, MD

## 2020-05-25 ENCOUNTER — Telehealth: Payer: Self-pay | Admitting: Family Medicine

## 2020-05-25 NOTE — Telephone Encounter (Signed)
Please advise 

## 2020-05-25 NOTE — Telephone Encounter (Signed)
The patient wants to know if it is okay for him to fly on Monday?  He just had open heart surgery almost 6 weeks ago.  Please advise

## 2020-05-26 NOTE — Telephone Encounter (Signed)
Per Dr. Martinique - okay for pt to fly if his surgeon okays it.

## 2020-05-26 NOTE — Telephone Encounter (Signed)
I advised the patient that Dr. Martinique suggested for him to ask his heart surgeon and if his heart surgeon is okay with him flying that she is okay with it.

## 2020-05-29 ENCOUNTER — Telehealth: Payer: Self-pay | Admitting: *Deleted

## 2020-05-29 NOTE — Telephone Encounter (Signed)
Mr. Abad called with questions regarding flying s/p acute aortic dissection repair 04/22/20. Pt advised not to fly at this time. Pt states he plans to drive with a family member to [place] to stay with family. Pt states he will be in [place] until February so he needs to cancel his f/u appt with CT in November with Dr. Cyndia Bent. Per pt, he plans on getting a cardiologist in [place] to help manage his blood pressure while he is in [place]. Pt also states he only has two pills left of his amlodipine and lisinopril. Pt informed that refills were sent to his pharmacy and it is very important for him to continue taking these medications for blood pressure management.  Pt accepts and acknowledges receipt.

## 2020-06-03 NOTE — Progress Notes (Signed)
Patient is scheduled for a telephone visit on 11/29 at 3:30

## 2020-06-04 ENCOUNTER — Telehealth: Payer: Self-pay

## 2020-06-04 ENCOUNTER — Telehealth: Payer: Self-pay | Admitting: *Deleted

## 2020-06-04 NOTE — Telephone Encounter (Signed)
Richard Patterson called seeking advice stating when he stands up he feels dizzy. Pt states this has occurred over the past two days. He does not believe he is standing up faster than normal. States he is taking his blood pressure medications as prescribed, however he does not have a blood pressure machine to routinely check his pressures. Pt has left [place] to stay with family in [place] for a few months. Pt strongly encouraged to purchase a blood pressure machine to routinely check pressures as well as to stand up slowly and to sit down if he feels dizzy. Pt also states he is trying to find a [place] based cardiologist to help assist with his care while he is in [place]. Richard Patterson is still experiencing numbness and tingling in his left thigh and right foot. Pt states this has been happening since surgery. Unfortunately since he is not in [place] this can not be assessed. Pt urged to go to an urgent care or to the ED if he feels out of sorts or runs across any issues that may arise while in [place]. Currently, all issues addressed at this time.

## 2020-06-04 NOTE — Telephone Encounter (Signed)
Name of Caller:  Patient   Relationship to Patient:  Self   Phone Number:  [phone]     Referring Provider:   Dr. Laneta Simmers (surgeon in [place])     PCP Name:  (does not have one) just moved back to [facility]   PCP Phone Number:      Does the patient have a previous cardiologist?: Y   Dr. Evelene Croon  [facility]  [phone]    Has the patient had any cardiac testing or recent hospitalizations? Y open heart surgery done on 9/29 in [place]     Other Comments:

## 2020-06-04 NOTE — Progress Notes (Signed)
.    Cardiology Office Note    Date of Consult: 06/04/2020 Patient: Robert Valdez   Patients PCP: Unknown, Provider Patient DOB: [birth year]     History of Present Illness/Reason For Visit     Robert Valdez is an 44 y.o. male with a hx of HTN who we were asked to see for because of recent Type A aortic dissection s/p repair. He used to live in [facility] and recently made the move back to [place] because his family is up here so that they can help with his care after surgery. On 04/22/2020 he went to the hospital in [place] after experiencing chest pain, back pain and left arm and leg numbness. He had severe systemic HTN in the ED. CTA perfomed demonstrating an acute type A aortic dissection that originated at the level of the sinotubular junction and extended to the left common iliac artery. The dissection flap entered the superior mesenteric artery with partial occlusion and reconstitution distally.    He was brought for emergent surgery where he underwent a type A aortic dissection repair with a 30 mm Hemasheild Platinum vascular graft. Intraprocedural TEE showed severe LVH with normal systolic function. Normal RV function. Trace central AI but otherwise normal appearing aortic valve.      Seen by CT surgery in [place] after surgery around 3 weeks ago. Noted that he continued to have incisional pain and he was sleeping in a recliner because he could not get comfortable. He also noted that he had continued to have neuropathic left leg pain that was present since his surgery. It was thought to be in part due to his recent dissection. He was started on a short course of Oxycodone and neurontin for this. It was recommended that he refrain from lifting anything > 10 lbs for 3 months post operatively and plan was to see him back in around 1 month.     Since then in order to get more support with his recovery after surgery he has moved back to where his family is in [place] and is currently living with his  mom. He plans to be up her longterm due to this. Since being seen by his surgeon a couple weeks ago he states taht he still notices some incisional pain that hurts when he touches the area and when he takes a deep breath. He took one 800mg  Ibuprofen last night which helped a little with his pain and he was able to get some sleep afterwards. Overall he believes that this is improving. Additionally he has since moved from sleeping in a recliner to now sleeping in a bed. He still has difficulty with staying comfortable and he constantly tosses and turns in the night. He does not have an issue with breathing.     From an activity standpoint he is getting back to walking and tries to do this 10-20 min daily, however, he is not able to get to it some days. The longest he walked was on Halloween where he was out walking for 1 hr with his family. He did ok with this but afterwards he felt very tired. He still gets the intermittent lateral left leg numbness as well as pins and needles. Noted that prior to his hospitalization he knew he had HTN and was given blood pressure medications for this but never took them. Sometimes he gets lightheaded/dizzy when he goes from sitting to standing. He never passes out. He does not check his blood pressure at home.  He has since stopped smoking.        Past Medical and Surgical History     Past Medical History:   Diagnosis Date    Coronary artery disease     Hypertension     Obesity     Psychiatric disorder      Past Surgical History:   Procedure Laterality Date    CHOLECYSTECTOMY      wrist surgery         Medications and Allergies     Current Outpatient Medications   Medication Sig    ondansetron (ZOFRAN) 4 MG tablet Take 1 tablet (4 mg total) by mouth 3 times daily as needed for Nausea    oxyCODONE-acetaminophen (PERCOCET) 5-325 MG per tablet Take 1-2 tablets by mouth every 4-6 hours as needed for Pain   Max daily dose: 12 tablets    amLODIPine (NORVASC) 10 MG tablet Take 1  tablet (10 mg total) by mouth daily    ondansetron (ZOFRAN) 4 MG tablet Take 1 tablet (4 mg total) by mouth 3 times daily as needed for Nausea    ondansetron (ZOFRAN-ODT) 4 MG disintegrating tablet Take 1 tablet (4 mg total) by mouth 3 times daily as needed for Nausea   Place on top of tongue.    omeprazole (PRILOSEC) 20 MG capsule Take 20 mg by mouth daily (before breakfast)    metoprolol (TOPROL-XL) 50 MG 24 hr tablet Take 50 mg by mouth daily   Do not crush or chew. May be divided.    chlorthalidone (HYGROTEN) 25 MG tablet Take 25 mg by mouth every morning    sertraline (ZOLOFT) 100 MG tablet Take 200 mg by mouth daily     He is allergic to shellfish-derived products.    Social and Family History     Family History   Problem Relation Age of Onset    Migraines Mother      Social History     Socioeconomic History    Marital status: Single     Spouse name: Not on file    Number of children: Not on file    Years of education: Not on file    Highest education level: Not on file   Occupational History    Not on file   Tobacco Use    Smoking status: Former Smoker     Types: Cigarettes     Quit date: 06/21/2013     Years since quitting: 6.9    Smokeless tobacco: Never Used   Substance and Sexual Activity    Alcohol use: No     Comment: none in 3 weeks-previously every weekend    Drug use: No    Sexual activity: Never   Social History Narrative    Not on file         Review of Systems     Review of Systems   Constitutional: Negative.    HENT: Negative.    Eyes: Negative.    Respiratory: Negative.    Cardiovascular:        Incisional chest pain   Gastrointestinal: Negative.    Musculoskeletal: Negative.    Skin: Negative.    Neurological:        Lateral left leg numbness and tingling   Endo/Heme/Allergies: Negative.    Psychiatric/Behavioral: Negative.      Vitals and Physical Exam     Robert Valdez's  height is 1.88 m (6\' 2" ) and weight is 126.6 kg (279 lb). His blood pressure is 112/52 and  his pulse is 81. His  oxygen saturation is 97%.  Body mass index is 35.82 kg/m.    Physical Exam  Constitutional:       General: He is not in acute distress.     Appearance: He is obese.   HENT:      Head: Normocephalic and atraumatic.      Nose: Nose normal. No congestion.      Mouth/Throat:      Mouth: Mucous membranes are dry.   Eyes:      Extraocular Movements: Extraocular movements intact.      Conjunctiva/sclera: Conjunctivae normal.      Pupils: Pupils are equal, round, and reactive to light.   Cardiovascular:      Rate and Rhythm: Normal rate and regular rhythm.      Pulses: Normal pulses.      Heart sounds: Normal heart sounds. No murmur heard.  No gallop.    Pulmonary:      Effort: Pulmonary effort is normal. No respiratory distress.      Breath sounds: Normal breath sounds. No wheezing or rales.   Abdominal:      General: Abdomen is flat. There is no distension.      Palpations: Abdomen is soft.   Musculoskeletal:         General: No swelling. Normal range of motion.      Cervical back: Normal range of motion and neck supple.   Skin:     General: Skin is warm and dry.      Comments: Sternal incision is healing well. No overt tenderness over the site.    Neurological:      Mental Status: He is alert and oriented to person, place, and time.      Cranial Nerves: No cranial nerve deficit.      Motor: No weakness.   Psychiatric:         Mood and Affect: Mood normal.         Behavior: Behavior normal.         Thought Content: Thought content normal.         Judgment: Judgment normal.       Laboratory Data     Hematology:   No results found for requested labs within last 730 days.     Chemistry:   No results found for requested labs within last 730 days.     Coagulation Studies:   No results found for requested labs within last 730 days.     Cardiac:   No results found for requested labs within last 730 days.     Lipids:   No results found for requested labs within last 730 days.       Cardiac/Imaging Data & Risk Scores     ECG: All  prior ECGs and telemetry reviewed                       Impression and Plan     ASSESSMENT:  1. Encounter to establish care    2. Aortic dissection, thoracoabdominal    3. LVH (left ventricular hypertrophy)    4. S/P aortic dissection repair        RECOMMENDATIONS:      Type A aortic dissection s/p hemiarch repair with a 30 mm Hemasheild Platinum vascular graft  Left ventricular hypertrophy  Hypertension  Overall he is relatively stable from his recent Type A aortic dissection s/p repair. He states that he is making some progress  and is now sleeping in bed as opposed to his recliner and still has some incisional pain but he believe that it is improving. Blood pressure is reasonably controlled today in the office. ECG reviewed is sinus rhythm with LVH and repolarization changes as well as an inferior infarction. Will order a 2D echo to further assess these changes. Additionally I have ordered CMP/CBC and lipid panel. He will require a CTA of his chest abdomen and pelvis within in the next month which will be his new baseline. I have ordered cardiopulmonary rehab referral to help with his recovery. Would hold off on lifting anything >10 lbs for 3 months after his surgery. In the meantime, would continue on with his anti-HTN medications that were previously prescribed by his surgery team. Follow up in 3 months.         [place]azilandJordan Arul Farabee, DO

## 2020-06-05 ENCOUNTER — Ambulatory Visit: Payer: MEDICAID | Attending: Cardiovascular Disease | Admitting: Cardiovascular Disease

## 2020-06-05 ENCOUNTER — Encounter: Payer: Self-pay | Admitting: Cardiovascular Disease

## 2020-06-05 VITALS — BP 112/52 | HR 81 | Ht 74.0 in | Wt 279.0 lb

## 2020-06-05 DIAGNOSIS — I10 Essential (primary) hypertension: Secondary | ICD-10-CM | POA: Insufficient documentation

## 2020-06-05 DIAGNOSIS — Z7689 Persons encountering health services in other specified circumstances: Secondary | ICD-10-CM | POA: Insufficient documentation

## 2020-06-05 DIAGNOSIS — Z9889 Other specified postprocedural states: Secondary | ICD-10-CM | POA: Insufficient documentation

## 2020-06-05 DIAGNOSIS — R9431 Abnormal electrocardiogram [ECG] [EKG]: Secondary | ICD-10-CM

## 2020-06-05 DIAGNOSIS — I517 Cardiomegaly: Secondary | ICD-10-CM | POA: Insufficient documentation

## 2020-06-05 DIAGNOSIS — Z789 Other specified health status: Secondary | ICD-10-CM

## 2020-06-05 DIAGNOSIS — I7103 Dissection of thoracoabdominal aorta: Secondary | ICD-10-CM | POA: Insufficient documentation

## 2020-06-05 MED ORDER — AMLODIPINE BESYLATE 5 MG PO TABS *I*
5.0000 mg | ORAL_TABLET | Freq: Every day | ORAL | Status: DC
Start: 2020-06-05 — End: 2020-06-15

## 2020-06-08 ENCOUNTER — Telehealth: Payer: Self-pay | Admitting: Cardiovascular Disease

## 2020-06-08 NOTE — Telephone Encounter (Signed)
Please see message below. S/W patient who says during the last OV with you that you mentioned prescribing something that could help his chest pain. The pain is from a prior surgical procedure. He also mentioned that you wanted him to get a CT scan, I did see that mentioned in the last OV note. He is questioning when should this get done. He is scheduled for an echocardiogram 12/1. Please advise.

## 2020-06-08 NOTE — Telephone Encounter (Signed)
S/W patient to inform per JK message below. Patient verbalizes understanding. He is aware that he need to get the blood work done in order to get the CT scan done and states he plans to go get it done.

## 2020-06-08 NOTE — Telephone Encounter (Signed)
JK pt; states JK told him he would give him something for CP which he has had for the last three days.

## 2020-06-15 ENCOUNTER — Telehealth: Payer: Self-pay | Admitting: Cardiovascular Disease

## 2020-06-15 ENCOUNTER — Other Ambulatory Visit: Payer: Self-pay

## 2020-06-15 DIAGNOSIS — I10 Essential (primary) hypertension: Secondary | ICD-10-CM

## 2020-06-15 NOTE — Telephone Encounter (Signed)
Medication Refill:      Pharmacy name and location: [facility] on [address]   Name of medication: Labetalol, Lisinopril, Gabapentin, Amlodipine, Clonidine   Dose of medication: 200mg , 20 mg, 300mg , 5mg , 0.2mg     Frequency of medication: 1.5 pills twice a day, once a day, twice a day, once a day, 1.5 pills once a day

## 2020-06-15 NOTE — Telephone Encounter (Signed)
Refills e-scribed in separate encounter and sent to requested pharmacy.

## 2020-06-16 MED ORDER — LABETALOL HCL 300 MG PO TABS *I*
300.0000 mg | ORAL_TABLET | Freq: Two times a day (BID) | ORAL | 3 refills | Status: DC
Start: 2020-06-16 — End: 2021-02-02

## 2020-06-16 MED ORDER — CLONIDINE HCL 0.3 MG PO TABS *I*
0.3000 mg | ORAL_TABLET | Freq: Two times a day (BID) | ORAL | 3 refills | Status: DC
Start: 2020-06-16 — End: 2021-02-02

## 2020-06-16 MED ORDER — GABAPENTIN 300 MG PO CAPSULE *I*
300.0000 mg | ORAL_CAPSULE | Freq: Two times a day (BID) | ORAL | 3 refills | Status: DC
Start: 2020-06-16 — End: 2021-02-02

## 2020-06-16 MED ORDER — LISINOPRIL 20 MG PO TABS *I*
20.0000 mg | ORAL_TABLET | Freq: Every day | ORAL | 3 refills | Status: AC
Start: 2020-06-16 — End: ?

## 2020-06-16 MED ORDER — AMLODIPINE BESYLATE 5 MG PO TABS *I*
5.0000 mg | ORAL_TABLET | Freq: Every day | ORAL | 3 refills | Status: AC
Start: 2020-06-16 — End: ?

## 2020-06-17 ENCOUNTER — Telehealth: Payer: Self-pay | Admitting: Cardiovascular Disease

## 2020-06-17 ENCOUNTER — Other Ambulatory Visit: Payer: Self-pay

## 2020-06-17 ENCOUNTER — Ambulatory Visit: Payer: Self-pay | Admitting: Surgery

## 2020-06-17 NOTE — Telephone Encounter (Signed)
FYI: You referred patient to Cardiac Rehab, unfortunately, patient does not qualify for this program. Please see message and qualifying diagnosis below from Cardiac Rehab. Mailed letter to patient.    Unfortunately, Aortic Dissection Repair is NOT a qualifying event/ diagnosis for Cardiac Rehab per CMS Guidelines.  Pt does not meet criteria. Please notify provider and patient of update. If pt's don't meet criteria, we do recommend that referring offices look into alternative Health and Exercise programs such as the [facility] or the local [employer] or gyms.    See below for list of guidelines    CARDIAC REHAB Diagnosis: (Pt must have had event within last 12 months per CMS)   s/p STEMI         s/p NSTEMI        s/p CABG         s/p Angioplasty, PCI         s/p Valve replace, repair        s/p Heart / Lung transplant         Stable Angina Pectoris criteria: all patients must have a pre-entry stress test which is positive for exercise-induced ischemia within 6 months of starting cardiac rehabilitation. A positive stress test in this context implies a junctional depression of 2 mm or more with associated slowly rising ST segment, or horizontal or 1 mm down-sloping ST segment depressions. Positive stress test also includes perfusion studies which demonstrate ischemia.      Stable CHF criteria: 1] Left Ventricular ejection fraction ?35%                                     2] AND NYHA Class II-IV on therapy for at least 6 weeks                                    3] Stable defined as no recent (?6 weeks) or planned (?6 months)                                              major cardiovascular hospitalizations or procedures         Other: s/p VAD placement (not on CMS guidelines- Varies by [employer])    Please notify providers of update.  Thank you for your referral to our program.    Winferd Humphrey  Referral Coordinator/ Office Manager  [address] [place]  [place], [place] [address]  Phone:  [phone]  Fax: [phone]

## 2020-06-22 ENCOUNTER — Telehealth: Payer: Self-pay | Admitting: Cardiovascular Disease

## 2020-06-22 ENCOUNTER — Encounter: Payer: Self-pay | Admitting: Family Medicine

## 2020-06-22 DIAGNOSIS — Z8679 Personal history of other diseases of the circulatory system: Secondary | ICD-10-CM

## 2020-06-22 DIAGNOSIS — Z9889 Other specified postprocedural states: Secondary | ICD-10-CM

## 2020-06-22 NOTE — Progress Notes (Signed)
No showed. Sent several links to his mobile phone. Called, no answer. Left message in his voice mail. Richard Benassi Martinique, MD

## 2020-06-22 NOTE — Telephone Encounter (Signed)
S/w pt, he is taking Lisinopri 20 mg daily, in the last 3-4 days he has had persistent dry coughing, after a lot of coughing he will eventually cough up a little clear mucus but otherwise it has not been productive.  BP has been running 120s/60s.    Pt asking if there is an alternative to lisinopril that he could try. Please advise

## 2020-06-22 NOTE — Telephone Encounter (Signed)
Pt calling states he is taking lisinopril and looked up the side effects and states he has a dry cough and hasn't been able to sleep in 3 days. Would like to know if he could take something else.

## 2020-06-22 NOTE — Telephone Encounter (Signed)
Spoke with Robert Valdez about his symptoms. He has had cough and congestion that started recently. No known sick contacts. Has been on Lisinopril for a couple months now with no previous issue. Difficult to say that his symptoms are from the lisinopril. ? URI infection. Would suggest holding off on making any changes right now. If symptoms persists it may be reasonable to switch to ARB.    I also inquired about his labs that were written for on last visit. He will get them done next week. I will order the CTA chest and abdomen/pelvis today so that he can schedule this for mid December.     All questions and concerns answered.

## 2020-06-23 LAB — EKG 12-LEAD
P: 43 deg
PR: 171 ms
QRS: 5 deg
QRSD: 114 ms
QT: 424 ms
QTc: 481 ms
Rate: 77 {beats}/min
T: 31 deg

## 2020-06-24 ENCOUNTER — Other Ambulatory Visit: Payer: Self-pay

## 2020-06-24 ENCOUNTER — Ambulatory Visit: Payer: Self-pay | Admitting: Cardiovascular Disease

## 2020-06-24 NOTE — Progress Notes (Deleted)
Cardiology Office Note   Date:  06/24/2020   ID:  Richard Patterson, DOB 05/26/1976, MRN [id number]  PCP:  Martinique, Betty G, MD  Cardiologist:   Skeet Latch, MD   No chief complaint on file.     History of Present Illness: Richard Patterson is a 44 y.o. male with aortic dissection status post repair, hypertension, and polycythemia who is being seen today for the evaluation of *** at the request of Martinique, Betty G, MD.  He was hospitalized 03/2020 with chest pain and back pain.  CTA of the chest abdomen and pelvis revealed an acute type a dissection at the level of the sinotubular junction and extending to the left common iliac artery.  The dissection flap involve the SMA with partial occlusion and reconstitution distally.  The celiac and IMA were patent.  He also had a mild ascending aortic aneurysm of 4.0 cm.  He underwent urgent repair using a 30 mm Hemashield straight graft (hemiarch)   Past Medical History:  Diagnosis Date  . Essential hypertension     Past Surgical History:  Procedure Laterality Date  . AORTIC VALVE REPAIR N/A 04/22/2020   Procedure: REPAIR OF AORTIC DISSECTION USING HEMASHIELD PLATINUM 30 MM VASCULAR GRAFT.;  Surgeon: Gaye Pollack, MD;  Location: [facility];  Service: Open Heart Surgery;  Laterality: N/A;  Axillary cannulation  . CHOLECYSTECTOMY    . HAND TENDON SURGERY Right      Current Outpatient Medications  Medication Sig Dispense Refill  . amLODipine (NORVASC) 5 MG tablet Take 1 tablet (5 mg total) by mouth daily. 30 tablet 3  . aspirin 325 MG EC tablet Take 1 tablet (325 mg total) by mouth daily. 30 tablet 3  . citalopram (CELEXA) 20 MG tablet Start 1/2 tab daily for 1 week then increase to the whole tab. 30 tablet 1  . cloNIDine (CATAPRES) 0.2 MG tablet Take 1.5 tablets (0.3 mg total) by mouth 2 (two) times daily. 60 tablet 3  . gabapentin (NEURONTIN) 300 MG capsule Take 1 capsule (300 mg total) by mouth 2 (two) times daily. 60 capsule 3  . labetalol  (NORMODYNE) 200 MG tablet Take 1.5 tablets (300 mg total) by mouth 2 (two) times daily. 60 tablet 3  . lisinopril (ZESTRIL) 20 MG tablet Take 1 tablet (20 mg total) by mouth daily. 30 tablet 3  . pantoprazole (PROTONIX) 40 MG tablet Take 1 tablet (40 mg total) by mouth daily. 30 tablet 3   No current facility-administered medications for this visit.    Allergies:   Shellfish allergy    Social History:  The patient  reports that he has quit smoking. His smoking use included cigarettes. He smoked 0.50 packs per day. He has never used smokeless tobacco. He reports that he does not drink alcohol and does not use drugs.   Family History:  The patient's ***family history includes Cancer in his maternal grandfather and maternal grandmother; Hypertension in his father and mother.    ROS:  Please see the history of present illness.   Otherwise, review of systems are positive for {NONE DEFAULTED:18576::"none"}.   All other systems are reviewed and negative.    PHYSICAL EXAM: VS:  There were no vitals taken for this visit. , BMI There is no height or weight on file to calculate BMI. GENERAL:  Well appearing HEENT:  Pupils equal round and reactive, fundi not visualized, oral mucosa unremarkable NECK:  No jugular venous distention, waveform within normal limits, carotid upstroke brisk and  symmetric, no bruits, no thyromegaly LYMPHATICS:  No cervical adenopathy LUNGS:  Clear to auscultation bilaterally HEART:  RRR.  PMI not displaced or sustained,S1 and S2 within normal limits, no S3, no S4, no clicks, no rubs, *** murmurs ABD:  Flat, positive bowel sounds normal in frequency in pitch, no bruits, no rebound, no guarding, no midline pulsatile mass, no hepatomegaly, no splenomegaly EXT:  2 plus pulses throughout, no edema, no cyanosis no clubbing SKIN:  No rashes no nodules NEURO:  Cranial nerves II through XII grossly intact, motor grossly intact throughout PSYCH:  Cognitively intact, oriented to  person place and time    EKG:  EKG {ACTION; IS/IS WPV:[id number]} ordered today. The ekg ordered today demonstrates ***   Recent Labs: 04/22/2020: ALT 39 04/24/2020: Magnesium 2.3 04/27/2020: Hemoglobin 12.0; Platelets 171 04/29/2020: BUN 28; Creatinine, Ser 1.07; Potassium 4.0; Sodium 139    Lipid Panel    Component Value Date/Time   CHOL 143 01/21/2019 0830   TRIG 162.0 (H) 01/21/2019 0830   HDL 44.20 01/21/2019 0830   CHOLHDL 3 01/21/2019 0830   VLDL 32.4 01/21/2019 0830   LDLCALC 66 01/21/2019 0830      Wt Readings from Last 3 Encounters:  05/20/20 284 lb 6.4 oz (129 kg)  05/11/20 283 lb 12.8 oz (128.7 kg)  04/29/20 284 lb 1.6 oz (128.9 kg)      ASSESSMENT AND PLAN:  ***   Current medicines are reviewed at length with the patient today.  The patient {ACTIONS; HAS/DOES NOT HAVE:19233} concerns regarding medicines.  The following changes have been made:  {PLAN; NO CHANGE:13088:s}  Labs/ tests ordered today include: *** No orders of the defined types were placed in this encounter.    Disposition:   FU with ***     Signed, Kristopher Delk C. Oval Linsey, MD, [facility]  06/24/2020 8:50 AM    [facility]

## 2020-06-26 ENCOUNTER — Telehealth: Payer: Self-pay | Admitting: Cardiovascular Disease

## 2020-06-26 NOTE — Telephone Encounter (Signed)
Patient missed appt on   06-24-20 echo per JK  [employer] made first call 06-25-20 call was completed.

## 2020-06-30 NOTE — Telephone Encounter (Signed)
Spoke with patient and scheduled. DONE.

## 2020-07-22 ENCOUNTER — Other Ambulatory Visit: Payer: Self-pay

## 2020-08-13 ENCOUNTER — Other Ambulatory Visit: Payer: Self-pay | Admitting: Surgery

## 2020-08-13 DIAGNOSIS — I71019 Dissection of thoracic aorta, unspecified: Secondary | ICD-10-CM

## 2020-08-13 DIAGNOSIS — Z9889 Other specified postprocedural states: Secondary | ICD-10-CM

## 2020-08-13 DIAGNOSIS — I7101 Dissection of thoracic aorta: Secondary | ICD-10-CM

## 2020-08-14 ENCOUNTER — Telehealth (INDEPENDENT_AMBULATORY_CARE_PROVIDER_SITE_OTHER): Payer: Self-pay | Admitting: Family Medicine

## 2020-08-14 ENCOUNTER — Encounter: Payer: Self-pay | Admitting: Family Medicine

## 2020-08-14 VITALS — Ht 73.0 in

## 2020-08-14 DIAGNOSIS — M79605 Pain in left leg: Secondary | ICD-10-CM

## 2020-08-14 DIAGNOSIS — G8929 Other chronic pain: Secondary | ICD-10-CM

## 2020-08-14 DIAGNOSIS — R200 Anesthesia of skin: Secondary | ICD-10-CM

## 2020-08-14 DIAGNOSIS — M545 Low back pain, unspecified: Secondary | ICD-10-CM

## 2020-08-14 MED ORDER — DULOXETINE HCL 30 MG PO CPEP: 30.0000 mg | ORAL_CAPSULE | Freq: Every day | ORAL | 1 refills | Status: DC

## 2020-08-14 NOTE — Progress Notes (Signed)
Virtual Visit via Video Note I connected with Richard Patterson on 08/14/20 by a video enabled telemedicine application and verified that I am speaking with the correct person using two identifiers.  Location patient: home Location provider:work office Persons participating in the virtual visit: patient, provider  I discussed the limitations of evaluation and management by telemedicine and the availability of in person appointments. The patient expressed understanding and agreed to proceed.  Chief Complaint  Patient presents with  . Foot Pain    Right foot  . Leg Pain    Left leg, pin & needle feeling    HPI: [name] is a 45 yo male with history of aortic valve repair, hypertension, hyperlipidemia complaining of 4 months of peanuts and needle sensation in left lower extremity, lateral aspect of thigh. Also reporting right foot numbness. States that symptoms started after vascular surgical procedure, status post aortic dissection repair. According to patient, he presented to the ER with bilateral lower extremity numbness, he had emergent surgery same day.    On 04/22/2020 he presented to the ER c/o lower back and RLE pain. Chest and abdominal CTA showed acute type a aortic dissection beginning at about the level of the sinotubular junction and extending around the aorta down to the left common iliac artery. The dissection flap enters the superior mesenteric artery with partial occlusion and reconstitution distally. The celiac trunk and IMA are patent. There is mild dilatation of the ascending aorta at about 4 cm. 04/23/2020 he underwent  replacement of the ascending aorta (hemi-arch) using a 30 mm Hemashield graft under deep hypothermic circulatory arrest.  He has followed with surgeon, Dr Cyndia Bent, last visit on 05/20/20. According to patient, he had discussed this problem with surgeon. Per record review, he has had lower extremity symptoms since before surgery. Peripheral pulses administered  on in both extremity during follow-ups. He has not noted cyanosis.  His cardiovascular surgeon has prescribed gabapentin 300 mg twice daily, he does not feel the medication is helping with pain but night dose has helped with his sleep.  Negative for CP, palpitation, dyspnea, or diaphoresis.  Lab Results  Component Value Date   CREATININE 1.07 04/29/2020   BUN 28 (H) 04/29/2020   NA 139 04/29/2020   K 4.0 04/29/2020   CL 104 04/29/2020   CO2 25 04/29/2020  Lower back pain, bilateral, nonradiating. He has had this problem for years. Negative for saddle anesthesia and bowel/bladder dysfunction.   Last visit, 05/22/2020, he was complaining about worsening anxiety, Celexa recommended.  He is no longer taking medication. He is feeling better. He is trying to increase physical activity.  ROS: See pertinent positives and negatives per HPI.  Past Medical History:  Diagnosis Date  . Essential hypertension     Past Surgical History:  Procedure Laterality Date  . AORTIC VALVE REPAIR N/A 04/22/2020   Procedure: REPAIR OF AORTIC DISSECTION USING HEMASHIELD PLATINUM 30 MM VASCULAR GRAFT.;  Surgeon: Gaye Pollack, MD;  Location: [facility];  Service: Open Heart Surgery;  Laterality: N/A;  Axillary cannulation  . CHOLECYSTECTOMY    . HAND TENDON SURGERY Right     Family History  Problem Relation Age of Onset  . Hypertension Mother   . Hypertension Father   . Cancer Maternal Grandmother   . Cancer Maternal Grandfather   . Heart attack Neg Hx   . Heart failure Neg Hx     Social History   Socioeconomic History  . Marital status: Significant Other  Spouse name: Not on file  . Number of children: Not on file  . Years of education: Not on file  . Highest education level: Not on file  Occupational History  . Not on file  Tobacco Use  . Smoking status: Former Smoker    Packs/day: 0.50    Types: Cigarettes  . Smokeless tobacco: Never Used  Substance and Sexual Activity  . Alcohol  use: No  . Drug use: No  . Sexual activity: Not Currently    Birth control/protection: None  Other Topics Concern  . Not on file  Social History Narrative  . Not on file   Social Determinants of Health   Financial Resource Strain: Not on file  Food Insecurity: Not on file  Transportation Needs: Not on file  Physical Activity: Not on file  Stress: Not on file  Social Connections: Not on file  Intimate Partner Violence: Not on file    Current Outpatient Medications:  .  amLODipine (NORVASC) 5 MG tablet, Take 1 tablet (5 mg total) by mouth daily., Disp: 30 tablet, Rfl: 3 .  aspirin 325 MG EC tablet, Take 1 tablet (325 mg total) by mouth daily., Disp: 30 tablet, Rfl: 3 .  cloNIDine (CATAPRES) 0.2 MG tablet, Take 1.5 tablets (0.3 mg total) by mouth 2 (two) times daily., Disp: 60 tablet, Rfl: 3 .  DULoxetine (CYMBALTA) 30 MG capsule, Take 1 capsule (30 mg total) by mouth daily., Disp: 30 capsule, Rfl: 1 .  gabapentin (NEURONTIN) 300 MG capsule, Take 1 capsule (300 mg total) by mouth 2 (two) times daily., Disp: 60 capsule, Rfl: 3 .  labetalol (NORMODYNE) 200 MG tablet, Take 1.5 tablets (300 mg total) by mouth 2 (two) times daily., Disp: 60 tablet, Rfl: 3 .  lisinopril (ZESTRIL) 20 MG tablet, Take 1 tablet (20 mg total) by mouth daily., Disp: 30 tablet, Rfl: 3 .  pantoprazole (PROTONIX) 40 MG tablet, Take 1 tablet (40 mg total) by mouth daily., Disp: 30 tablet, Rfl: 3  EXAM:  VITALS per patient if applicable:Ht 6\' 1"  (1.854 m)   BMI 37.52 kg/m   GENERAL: alert, oriented, appears well and in no acute distress  HEENT: atraumatic, conjunctiva clear, no obvious abnormalities on inspection.  NECK: normal movements of the head and neck  LUNGS: on inspection no signs of respiratory distress, breathing rate appears normal, no obvious gross SOB, gasping or wheezing  CV: no obvious cyanosis  MS: moves all visible extremities without noticeable abnormality  PSYCH/NEURO: pleasant and  cooperative, no obvious depression, + anxious, speech and thought processing grossly intact  ASSESSMENT AND PLAN:  Discussed the following assessment and plan:  Numbness of right foot - Plan: MR Lumbar Spine Wo Contrast Chronic. Appropriate foot care discussed. Lumbar MRI will be arranged.  Pain of left lower extremity - Plan: MR Lumbar Spine Wo Contrast We discussed possible etiologies. Per notes, peripheral pulses have been present during OV's. ? Radiculopathy. Lumbar MRI will be arranged. Gabapentin is not helping with problem, so recommend stopping day time dose. He can continue Gabapentin 300 mg at bedtime, which is helping him with sleep.  Instructed about warning signs.  Chronic bilateral low back pain, unspecified whether sciatica present - Plan: DULoxetine (CYMBALTA) 30 MG capsule, MR Lumbar Spine Wo Contrast After discussion of some side effects, he agrees with trying Cymbalta, starting 30 mg daily.   We discussed possible serious and likely etiologies, options for evaluation and workup, limitations of telemedicine visit vs in person visit, treatment, treatment risks  and precautions.  I discussed the assessment and treatment plan with the patient. Richard Patterson was provided an opportunity to ask questions and all were answered. He agreed with the plan and demonstrated an understanding of the instructions.   Return in about 4 weeks (around 09/11/2020).    Yoceline Bazar Martinique, MD

## 2020-08-16 NOTE — Progress Notes (Deleted)
Cardiology Office Note:    Date:  08/16/2020   ID:  Richard Patterson, DOB Sep 19, 1975, MRN [id number]  PCP:  Martinique, Betty G, MD  Cardiologist:  No primary care provider on file.  Electrophysiologist:  None   Referring MD: Martinique, Betty G, MD   No chief complaint on file. ***  History of Present Illness:    Richard Patterson is a 45 y.o. male with a hx of hypertension, tobacco use, type A aortic dissection status post repair who presents for initial visit.  He was admitted to [facility] from 9/29 through 04/29/2020.  Presented with chest pain and found to have type B aortic dissection originating ascending aorta just above sinus of Valsalva with dissection flap extending through entire aorta and terminating in left common iliac artery, causing partial occlusion of SMA.  Underwent emergent replacement of ascending aorta using 30 mm Hemashield graft on 04/23/2020.  He was discharged on amlodipine 5 mg daily, labetalol 300 mg twice daily, lisinopril 20 mg daily, clonidine 0.3 mg twice daily.  Past Medical History:  Diagnosis Date  . Essential hypertension     Past Surgical History:  Procedure Laterality Date  . AORTIC VALVE REPAIR N/A 04/22/2020   Procedure: REPAIR OF AORTIC DISSECTION USING HEMASHIELD PLATINUM 30 MM VASCULAR GRAFT.;  Surgeon: Gaye Pollack, MD;  Location: [facility];  Service: Open Heart Surgery;  Laterality: N/A;  Axillary cannulation  . CHOLECYSTECTOMY    . HAND TENDON SURGERY Right     Current Medications: No outpatient medications have been marked as taking for the 08/19/20 encounter (Appointment) with Donato Heinz, MD.     Allergies:   Shellfish allergy   Social History   Socioeconomic History  . Marital status: Significant Other    Spouse name: Not on file  . Number of children: Not on file  . Years of education: Not on file  . Highest education level: Not on file  Occupational History  . Not on file  Tobacco Use  . Smoking status: Former Smoker    Packs/day:  0.50    Types: Cigarettes  . Smokeless tobacco: Never Used  Substance and Sexual Activity  . Alcohol use: No  . Drug use: No  . Sexual activity: Not Currently    Birth control/protection: None  Other Topics Concern  . Not on file  Social History Narrative  . Not on file   Social Determinants of Health   Financial Resource Strain: Not on file  Food Insecurity: Not on file  Transportation Needs: Not on file  Physical Activity: Not on file  Stress: Not on file  Social Connections: Not on file     Family History: The patient's ***family history includes Cancer in his maternal grandfather and maternal grandmother; Hypertension in his father and mother. There is no history of Heart attack or Heart failure.  ROS:   Please see the history of present illness.    *** All other systems reviewed and are negative.  EKGs/Labs/Other Studies Reviewed:    The following studies were reviewed today: ***  EKG:  EKG is *** ordered today.  The ekg ordered today demonstrates ***  Recent Labs: 04/22/2020: ALT 39 04/24/2020: Magnesium 2.3 04/27/2020: Hemoglobin 12.0; Platelets 171 04/29/2020: BUN 28; Creatinine, Ser 1.07; Potassium 4.0; Sodium 139  Recent Lipid Panel    Component Value Date/Time   CHOL 143 01/21/2019 0830   TRIG 162.0 (H) 01/21/2019 0830   HDL 44.20 01/21/2019 0830   CHOLHDL 3 01/21/2019 0830   VLDL  32.4 01/21/2019 0830   LDLCALC 66 01/21/2019 0830    Physical Exam:    VS:  There were no vitals taken for this visit.    Wt Readings from Last 3 Encounters:  05/20/20 284 lb 6.4 oz (129 kg)  05/11/20 283 lb 12.8 oz (128.7 kg)  04/29/20 284 lb 1.6 oz (128.9 kg)     GEN: *** Well nourished, well developed in no acute distress HEENT: Normal NECK: No JVD; No carotid bruits LYMPHATICS: No lymphadenopathy CARDIAC: ***RRR, no murmurs, rubs, gallops RESPIRATORY:  Clear to auscultation without rales, wheezing or rhonchi  ABDOMEN: Soft, non-tender,  non-distended MUSCULOSKELETAL:  No edema; No deformity  SKIN: Warm and dry NEUROLOGIC:  Alert and oriented x 3 PSYCHIATRIC:  Normal affect   ASSESSMENT:    No diagnosis found. PLAN:    Type A aortic dissection: Status postrepair on 04/23/2020.  Follows with Dr. Cyndia Bent.  Hypertension: On amlodipine 5 mg daily, labetalol 300 mg twice daily, lisinopril 20 mg daily, clonidine 0.3 mg twice daily.  Tobacco use:  RTC in***   Medication Adjustments/Labs and Tests Ordered: Current medicines are reviewed at length with the patient today.  Concerns regarding medicines are outlined above.  No orders of the defined types were placed in this encounter.  No orders of the defined types were placed in this encounter.   There are no Patient Instructions on file for this visit.   Signed, Donato Heinz, MD  08/16/2020 1:48 PM    [facility]

## 2020-08-19 ENCOUNTER — Ambulatory Visit: Payer: Self-pay | Admitting: Surgery

## 2020-08-19 ENCOUNTER — Ambulatory Visit: Payer: Self-pay | Admitting: Cardiology

## 2020-08-21 NOTE — Progress Notes (Signed)
Patient scheduled 09/11/2020 at 11:30

## 2020-08-26 ENCOUNTER — Ambulatory Visit
Admission: RE | Admit: 2020-08-26 | Discharge: 2020-08-26 | Disposition: A | Discharge status: Home / Self Care | Payer: Self-pay | Source: Ambulatory Visit | Attending: Surgery | Admitting: Surgery

## 2020-08-26 ENCOUNTER — Other Ambulatory Visit: Payer: Self-pay

## 2020-08-26 ENCOUNTER — Ambulatory Visit: Payer: Self-pay | Admitting: Surgery

## 2020-08-26 DIAGNOSIS — I7101 Dissection of thoracic aorta: Secondary | ICD-10-CM

## 2020-08-26 DIAGNOSIS — Z9889 Other specified postprocedural states: Secondary | ICD-10-CM

## 2020-08-26 DIAGNOSIS — I71019 Dissection of thoracic aorta, unspecified: Secondary | ICD-10-CM

## 2020-09-02 ENCOUNTER — Emergency Department (HOSPITAL_COMMUNITY): Payer: No Typology Code available for payment source

## 2020-09-02 ENCOUNTER — Encounter (HOSPITAL_COMMUNITY): Payer: Self-pay | Admitting: Emergency Medicine

## 2020-09-02 ENCOUNTER — Emergency Department (HOSPITAL_COMMUNITY)
Admission: EM | Admit: 2020-09-02 | Discharge: 2020-09-02 | Disposition: A | Discharge status: Home / Self Care | Payer: No Typology Code available for payment source | Attending: Emergency Medicine | Admitting: Emergency Medicine

## 2020-09-02 DIAGNOSIS — I160 Hypertensive urgency: Secondary | ICD-10-CM | POA: Diagnosis not present

## 2020-09-02 DIAGNOSIS — Z20822 Contact with and (suspected) exposure to covid-19: Secondary | ICD-10-CM | POA: Insufficient documentation

## 2020-09-02 DIAGNOSIS — I10 Essential (primary) hypertension: Secondary | ICD-10-CM | POA: Diagnosis not present

## 2020-09-02 DIAGNOSIS — F17200 Nicotine dependence, unspecified, uncomplicated: Secondary | ICD-10-CM | POA: Insufficient documentation

## 2020-09-02 DIAGNOSIS — R072 Precordial pain: Secondary | ICD-10-CM

## 2020-09-02 DIAGNOSIS — R030 Elevated blood-pressure reading, without diagnosis of hypertension: Secondary | ICD-10-CM

## 2020-09-02 LAB — BASIC METABOLIC PANEL
Anion gap: 10 (ref 5–15)
BUN: 13 mg/dL (ref 6–20)
CO2: 25 mmol/L (ref 22–32)
Calcium: 9.5 mg/dL (ref 8.9–10.3)
Chloride: 106 mmol/L (ref 98–111)
Creatinine, Ser: 0.96 mg/dL (ref 0.61–1.24)
GFR, Estimated: >60 mL/min (ref >60)
Glucose, Bld: 72 mg/dL (ref 70–99)
Potassium: 3.6 mmol/L (ref 3.5–5.1)
Sodium: 141 mmol/L (ref 135–145)

## 2020-09-02 LAB — I-STAT CHEM 8, ED
BUN: 15 mg/dL (ref 6–20)
Calcium, Ion: 1.19 mmol/L (ref 1.15–1.40)
Chloride: 105 mmol/L (ref 98–111)
Creatinine, Ser: 0.8 mg/dL (ref 0.61–1.24)
Glucose, Bld: 74 mg/dL (ref 70–99)
HCT: 51 % (ref 39.0–52.0)
Hemoglobin: 17.3 g/dL — ABNORMAL HIGH (ref 13.0–17.0)
Potassium: 3.5 mmol/L (ref 3.5–5.1)
Sodium: 142 mmol/L (ref 135–145)
TCO2: 25 mmol/L (ref 22–32)

## 2020-09-02 LAB — CBC
HCT: 50.6 % (ref 39.0–52.0)
Hemoglobin: 17.6 g/dL — ABNORMAL HIGH (ref 13.0–17.0)
MCH: 30.3 pg (ref 26.0–34.0)
MCHC: 34.8 g/dL (ref 30.0–36.0)
MCV: 87.2 fL (ref 80.0–100.0)
Platelets: 231 10*3/uL (ref 150–400)
RBC: 5.8 MIL/uL (ref 4.22–5.81)
RDW: 15.3 % (ref 11.5–15.5)
WBC: 8 10*3/uL (ref 4.0–10.5)
nRBC: 0 % (ref 0.0–0.2)

## 2020-09-02 LAB — RESP PANEL BY RT-PCR (FLU A&B, COVID) ARPGX2
Influenza A by PCR: NEGATIVE
Influenza B by PCR: NEGATIVE
SARS Coronavirus 2 by RT PCR: NEGATIVE

## 2020-09-02 LAB — TROPONIN I (HIGH SENSITIVITY)
Troponin I (High Sensitivity): 16 ng/L (ref <18)
Troponin I (High Sensitivity): 17 ng/L (ref <18)

## 2020-09-02 LAB — PROTIME-INR
INR: 1 (ref 0.8–1.2)
Prothrombin Time: 12.5 seconds (ref 11.4–15.2)

## 2020-09-02 MED ORDER — CLONIDINE HCL 0.2 MG PO TABS: 0.3000 mg | ORAL_TABLET | Freq: Two times a day (BID) | ORAL | 0 refills | Status: DC

## 2020-09-02 MED ORDER — AMLODIPINE BESYLATE 5 MG PO TABS: 5.0000 mg | ORAL_TABLET | Freq: Every day | ORAL | 0 refills | Status: DC

## 2020-09-02 MED ORDER — LABETALOL HCL 5 MG/ML IV SOLN
10.0000 mg | Freq: Once | INTRAVENOUS | Status: AC
  Administered 2020-09-02: 10 mg via INTRAVENOUS
  Filled 2020-09-02: qty 4

## 2020-09-02 MED ORDER — LABETALOL HCL 200 MG PO TABS: 300.0000 mg | ORAL_TABLET | Freq: Two times a day (BID) | ORAL | 0 refills | Status: DC

## 2020-09-02 MED ORDER — CLONIDINE HCL 0.2 MG PO TABS
0.3000 mg | ORAL_TABLET | Freq: Once | ORAL | Status: AC
  Administered 2020-09-02: 0.3 mg via ORAL
  Filled 2020-09-02: qty 1

## 2020-09-02 MED ORDER — IOHEXOL 350 MG/ML SOLN
100.0000 mL | Freq: Once | INTRAVENOUS | Status: AC | PRN
  Administered 2020-09-02: 100 mL via INTRAVENOUS

## 2020-09-02 MED ORDER — FENTANYL CITRATE (PF) 100 MCG/2ML IJ SOLN
50.0000 ug | Freq: Once | INTRAMUSCULAR | Status: AC
  Administered 2020-09-02: 50 ug via INTRAVENOUS
  Filled 2020-09-02: qty 2

## 2020-09-02 NOTE — ED Notes (Signed)
Patient BIB [facility] for chest pain. BP 190/110 with EMS. BP 130/80 after 2x SL NTG. 20g left forearm. 324mg  aspirin.

## 2020-09-02 NOTE — ED Notes (Signed)
RN aware of elevated VS 

## 2020-09-02 NOTE — ED Triage Notes (Signed)
Patient here stating he is unsure of the time, but today he was eating when he suddenly felt chest pain and right leg numbness. Patient states he had heart surgery September 2021.

## 2020-09-02 NOTE — ED Triage Notes (Signed)
Emergency Medicine Provider Triage Evaluation Note  Werner Labella , a 45 y.o. male  was evaluated in triage.  I evaluated patient after reviewing his EKG.  History of aortic dissection last fall.  Patient reports sudden onset of left-sided chest pain with tingling in his left arm and left leg.  States pain improved after receiving meds from EMS but still having some ongoing pain.  Pain similar to dissection.  Review of Systems  Positive: Chest pain Negative:   Physical Exam  BP 133/88 (BP Location: Right Arm)   Pulse 94   Temp (!) 97.5 F (36.4 C) (Oral)   Resp 16   SpO2 98%  Gen:   Awake, no distress   HEENT:  Atraumatic  Resp:  Normal effort  Cardiac:  Normal rate  Abd:   Nondistended, nontender  MSK:   Moves extremities without difficulty  Neuro:  Speech clear   Medical Decision Making  Medically screening exam initiated at 5:33 PM.  Appropriate orders placed.  Niel Peretti was informed that the remainder of the evaluation will be completed by another provider, this initial triage assessment does not replace that evaluation, and the importance of remaining in the ED until their evaluation is complete.  Clinical Impression   44 year old male with sudden onset left-sided chest pain.  I reviewed his EKG from triage and noted new ST segment depressions.  Do not feel itmeets STEMI criteria.  After obtaining additional history, I am chiefly concerned with acute aortic dissection.  Patient will be taken immediately to CT, then to a room.  Discussed case with Dr. Sherry Ruffing who will assume care.   Lucrezia Starch, MD 09/02/20 [phone]

## 2020-09-02 NOTE — ED Provider Notes (Signed)
[facility] EMERGENCY DEPARTMENT Provider Note   CSN: [id number] Arrival date & time: 09/02/20  1646     History Chief Complaint  Patient presents with  . Chest Pain    Richard Patterson is a 45 y.o. male.  The history is provided by the patient and medical records. No language interpreter was used.  Chest Pain Pain location:  Substernal area and L chest Pain quality: sharp and stabbing   Pain radiates to:  L arm (L Leg) Pain severity:  Severe Onset quality:  Sudden Timing:  Constant Progression:  Improving Chronicity:  Recurrent Relieved by:  Nothing Worsened by:  Nothing Ineffective treatments:  Nitroglycerin Associated symptoms: diaphoresis, fatigue, nausea, numbness and shortness of breath   Associated symptoms: no abdominal pain, no altered mental status, no anxiety, no back pain, no cough, no fever, no headache, no lower extremity edema, no palpitations and no weakness   Risk factors: aortic disease        Past Medical History:  Diagnosis Date  . Hypertension     There are no problems to display for this patient.   History reviewed. No pertinent surgical history.     Family History  Problem Relation Age of Onset  . Hypertension Mother   . Hypertension Father     Social History   Tobacco Use  . Smoking status: Current Every Day Smoker  Substance Use Topics  . Alcohol use: Yes  . Drug use: Never    Home Medications Prior to Admission medications   Medication Sig Start Date End Date Taking? Authorizing Provider  doxycycline (VIBRAMYCIN) 100 MG capsule Take 1 capsule (100 mg total) by mouth 2 (two) times daily. 10/15/19   Vanessa Kick, MD  HYDROcodone-acetaminophen (NORCO/VICODIN) 5-325 MG tablet Take 1 tablet by mouth every 6 (six) hours as needed for moderate pain or severe pain. 10/15/19   Vanessa Kick, MD  NON FORMULARY STATE 2 BLOOD PRESSURE MEDICINES    [provider]    Allergies    Patient has no known  allergies.  Review of Systems   Review of Systems  Constitutional: Positive for diaphoresis and fatigue. Negative for chills and fever.  Eyes: Negative for visual disturbance.  Respiratory: Positive for shortness of breath. Negative for cough, chest tightness and wheezing.   Cardiovascular: Positive for chest pain. Negative for palpitations.  Gastrointestinal: Positive for nausea. Negative for abdominal pain, constipation and diarrhea.  Genitourinary: Negative for flank pain and frequency.  Musculoskeletal: Negative for back pain, neck pain and neck stiffness.  Skin: Negative for rash and wound.  Neurological: Positive for light-headedness and numbness. Negative for syncope, weakness and headaches.  Psychiatric/Behavioral: Negative for agitation and confusion.  All other systems reviewed and are negative.   Physical Exam Updated Vital Signs BP 133/88 (BP Location: Right Arm)   Pulse 94   Temp (!) 97.5 F (36.4 C) (Oral)   Resp 16   SpO2 98%   Physical Exam Vitals and nursing note reviewed.  Constitutional:      General: He is not in acute distress.    Appearance: He is well-developed and well-nourished. He is not ill-appearing, toxic-appearing or diaphoretic.  HENT:     Head: Normocephalic and atraumatic.  Eyes:     Extraocular Movements: Extraocular movements intact.     Conjunctiva/sclera: Conjunctivae normal.     Pupils: Pupils are equal, round, and reactive to light.  Cardiovascular:     Rate and Rhythm: Normal rate and regular rhythm.  Heart sounds: No murmur heard.   Pulmonary:     Effort: Pulmonary effort is normal. No tachypnea or respiratory distress.     Breath sounds: Normal breath sounds. No wheezing, rhonchi or rales.  Abdominal:     Palpations: Abdomen is soft.     Tenderness: There is no abdominal tenderness.  Musculoskeletal:        General: No edema.     Cervical back: Neck supple.     Right lower leg: No tenderness.     Left lower leg: No  tenderness.     Comments: Intact palpable DP and PT pulse on right leg and DP and PT pulse found by Doppler on left.    Skin:    General: Skin is warm and dry.     Capillary Refill: Capillary refill takes less than 2 seconds.     Findings: No erythema.  Neurological:     Mental Status: He is alert.     Cranial Nerves: No cranial nerve deficit or facial asymmetry.     Sensory: Sensory deficit present.     Motor: No tremor or abnormal muscle tone.     Comments: Subjective numbness in left arm compared to right.  No weakness appreciated throughout the body.  Numbness in left leg compared to right.  No facial droop or facial numbness.  No strength deficits.  Psychiatric:        Mood and Affect: Mood is anxious.     ED Results / Procedures / Treatments   Labs (all labs ordered are listed, but only abnormal results are displayed) Labs Reviewed  CBC - Abnormal; Notable for the following components:      Result Value   Hemoglobin 17.6 (*)    All other components within normal limits  I-STAT CHEM 8, ED - Abnormal; Notable for the following components:   Hemoglobin 17.3 (*)    All other components within normal limits  RESP PANEL BY RT-PCR (FLU A&B, COVID) ARPGX2  BASIC METABOLIC PANEL  PROTIME-INR  TROPONIN I (HIGH SENSITIVITY)  TROPONIN I (HIGH SENSITIVITY)    EKG EKG Interpretation  Date/Time:  Wednesday September 02 2020 17:07:52 EST Ventricular Rate:  101 PR Interval:  160 QRS Duration: 92 QT Interval:  392 QTC Calculation: 508 R Axis:   86 Text Interpretation: Sinus tachycardia with occasional Premature ventricular complexes Biatrial enlargement ST & T wave abnormality, consider inferolateral ischemia Abnormal ECG no STEMI Confirmed by Madalyn Rob [phone]) on 09/02/2020 5:24:18 PM   Radiology CT Angio Chest/Abd/Pel for Dissection W and/or Wo Contrast  Result Date: 09/02/2020 CLINICAL DATA:  History of aortic dissection, concern for recurrence or complication EXAM: CT  ANGIOGRAPHY CHEST, ABDOMEN AND PELVIS TECHNIQUE: Non-contrast CT of the chest was initially obtained. Multidetector CT imaging through the chest, abdomen and pelvis was performed using the standard protocol during bolus administration of intravenous contrast. Multiplanar reconstructed images and MIPs were obtained and reviewed to evaluate the vascular anatomy. CONTRAST:  129mL OMNIPAQUE IOHEXOL 350 MG/ML SOLN COMPARISON:  CTA chest abdomen pelvis April 22, 2020. FINDINGS: CTA CHEST FINDINGS Cardiovascular: Noncontrast imaging demonstrates no intramural hematoma. Postsurgical changes of ascending aortic dissection repair, without evidence of recurrence or complication. Proximal coronary arteries appear opacified. Aortic dissection extending from the proximal arch in terminating in the left external iliac artery appears similar to prior imaging. Dissection extends into the proximal right brachiocephalic. The true lumen fills the left common carotid, subclavian, celiac, SMA, right renal, IMA, right common iliac and left internal iliac  arteries. False lumen supplies the left renal artery. Mediastinum/Nodes: There is trace superior mediastinal fluid adjacent to proximal aorta. Thyroids unremarkable. No mediastinal, hilar or axillary lymphadenopathy. Trachea and esophagus appear unremarkable. Lungs/Pleura: No suspicious pulmonary nodules or masses. No pleural effusion. No pneumothorax. Musculoskeletal: Prior median sternotomy. No acute osseous abnormality. Review of the MIP images confirms the above findings. CTA ABDOMEN AND PELVIS FINDINGS VASCULAR Aorta: Postsurgical changes of ascending aortic dissection repair, without evidence of recurrence or complication. Aortic dissection extending from the proximal arch in terminating in the left external iliac artery appears similar to prior imaging. Celiac: Opacifies normally, supplied by the true lumen. SMA: Opacifies normally, supplied by the true lumen. Renals: Left  renal artery opacifies normally supplied by the false lumen. Right renal artery opacifies normally opacifies normally, supplied by the true lumen. IMA: Opacifies normally, supplied by the true lumen Review of the MIP images confirms the above findings. NON-VASCULAR Hepatobiliary: No suspicious hepatic lesions. Cholecystectomy. No biliary ductal dilatation. Pancreas: Unchanged size of the 2.8 cm round lesion in the tail the pancreas which appears similar in density and enhancement to the spleen. Spleen: Normal in size without focal abnormality. Adrenals/Urinary Tract: Adrenal glands are unremarkable. Kidneys are normal, without renal calculi, focal lesion, or hydronephrosis. Bladder is unremarkable. Stomach/Bowel: Stomach, small bowel, terminal ileum, appendix and colon appear normal without inflammation or evidence of obstruction. Lymphatic: No adenopathy Reproductive: Prostate is unremarkable. Other: None Musculoskeletal: No acute osseous abnormality Review of the MIP images confirms the above findings. IMPRESSION: Postsurgical changes ascending aortic dissection repair without definite evidence of complication, aneurysm or leak. There is a very small volume of mediastinal fluid adjacent to the aorta and pulmonary artery, this could be further evaluated with ECHO. Similar appearance of the transverse and descending aortic dissection which terminates in the LEFT common iliac artery, further described above. Similar appearance of the round lesion at the tail the pancreas with primary considerations of a primary pancreatic lesion versus large splenule. Further evaluation with nonemergent pancreas protocol MRI with and without contrast is recommended These results were called by telephone at the time of interpretation on 09/02/2020 at 6:07 pm to provider [facility] , who verbally acknowledged these results. Electronically Signed   By: Dahlia Bailiff MD   On: 09/02/2020 18:18    Procedures Procedures    Medications Ordered in ED Medications  fentaNYL (SUBLIMAZE) injection 50 mcg (50 mcg Intravenous Given 09/02/20 1823)  iohexol (OMNIPAQUE) 350 MG/ML injection 100 mL (100 mLs Intravenous Contrast Given 09/02/20 1746)  cloNIDine (CATAPRES) tablet 0.3 mg (0.3 mg Oral Given 09/02/20 2124)  labetalol (NORMODYNE) injection 10 mg (10 mg Intravenous Given 09/02/20 2125)    ED Course  I have reviewed the triage vital signs and the nursing notes.  Pertinent labs & imaging results that were available during my care of the patient were reviewed by me and considered in my medical decision making (see chart for details).    MDM Rules/Calculators/A&P                         Richard Patterson is a 45 y.o. male with a past medical history significant for hypertension, previous aortic dissection status post aortic root repair in September who presents with sudden onset chest pain, shortness of breath, and left arm/left leg numbness.  Patient reports that ever since his dissection, he chronically has pain in his left leg and some numbness in the left leg and in the right  foot.  He says that he had chest pain and left arm numbness with pain during his initial evaluation but over the last few months has not had the symptoms.  He reports that today around 330, he had sudden onset of pain that is sharp in his central chest.  It was 10 out of 10 in severity at onset.  He reports he called EMS who gave him 2 nitroglycerin causing the blood pressure to improve and his pain subsequently improved.  He reports the arm numbness has also improved but his left leg numbness is worse than baseline.  He denies any headache or neck pain.  Denies any other focal neurologic deficits.  He reports no associated shortness of breath, lightheadedness, and he got very diaphoretic with the chest pain.  He reports that is also improving with the blood pressure improving.  He denies any recent trauma, urinary symptoms, or GI symptoms.  Denies  any Covid symptoms.  On exam, lungs are clear and chest is  minimally tender to palpation on the left side.  No murmur.  Abdomen is nontender he is denying pain in his abdomen.  He had intact radial pulses bilaterally with intact grip strength and sensation in the arms.  He had intact strength in both legs but he did have more numbness on the left leg compared to the right.  He reports that this is slightly worse than baseline.  He had palpable DP and PT pulse in the right leg and his left leg a Doppler was used to find pulses which were both present.  EKG showed some ST abnormalities but there was no STEMI.  Patient was seen in triage by another provider and quickly had a CT dissection study ordered for evaluation.  I saw patient in the scanner and back to his exam room.  When the imaging was collected, I called radiology who reviewed it on the phone.  They do not see any evidence of a new dissection.  The only thing they see different is a small amount of fluid near the aorta in the mediastinal area that could be evaluated with echo.  I reassessed the patient is now his blood pressures in the 235T and 732K systolic.  He is still feeling better.  Anticipate reassessment after laboratory testing is completed to determine disposition.  Patient's blood pressure remained reassuring after we gave him his home blood pressure medicine. He now reports that he was out of his clonidine for the last few days after travel. Patient requested a refill of these.  We offered patient admission for hypertensive urgency with significant chest discomfort however given his reassuring work-up he would like to go home. He thinks it just caused his aortic injury to hurt with the blood pressure being elevated. Now his blood pressure doing better he would like to go home. Patient is going to see his cardiologist on Monday. His troponin was negative x2. Covid and flu test negative. Other work-up also reassuring.  Patient  demonstrated stability with his blood pressure and was able to eat and drink. He will follow-up and understands extremely strict return precautions. We refilled his blood pressure medications. He denies questions or concerns and was discharged in good condition with understanding of plan of care per   Final Clinical Impression(s) / ED Diagnoses Final diagnoses:  Elevated blood pressure reading  Precordial pain  Hypertensive urgency     Clinical Impression: 1. Elevated blood pressure reading   2. Precordial pain   3. Hypertensive  urgency     Disposition: Discharge  Condition: Good  I have discussed the results, Dx and Tx plan with the pt(& family if present). He/she/they expressed understanding and agree(s) with the plan. Discharge instructions discussed at great length. Strict return precautions discussed and pt &/or family have verbalized understanding of the instructions. No further questions at time of discharge.    Discharge Medication List as of 09/02/2020 10:56 PM    START taking these medications   Details  !! amLODipine (NORVASC) 5 MG tablet Take 1 tablet (5 mg total) by mouth daily., Starting Wed 09/02/2020, Print    !! cloNIDine (CATAPRES) 0.2 MG tablet Take 1.5 tablets (0.3 mg total) by mouth 2 (two) times daily., Starting Wed 09/02/2020, Print    !! labetalol (NORMODYNE) 200 MG tablet Take 1.5 tablets (300 mg total) by mouth 2 (two) times daily., Starting Wed 09/02/2020, Print     !! - Potential duplicate medications found. Please discuss with provider.      Follow Up: Your cardiologist on [facility] [address] [place]        Tegeler, Gwenyth Allegra, MD 09/02/20 [phone]

## 2020-09-02 NOTE — Discharge Instructions (Addendum)
Your imaging today showed no acute changes to your aortic dissection.  There was a small amount of fluid in your mediastinum we discussed however we did not feel that an echo would be as beneficial to evaluated.  As your symptoms are improving with the blood pressure improving, we do feel you are safe for discharge home.  Please fill the prescriptions for your blood pressure medicine and maintain your blood pressure management.  If your symptoms change or worsen or you develop any new symptoms, please return to the nearest emergency department.  Please follow-up with her cardiologist on Monday as we discussed.

## 2020-09-04 ENCOUNTER — Encounter (HOSPITAL_COMMUNITY): Payer: Self-pay | Admitting: Emergency Medicine

## 2020-09-06 NOTE — Progress Notes (Deleted)
Cardiology Office Note:    Date:  09/06/2020   ID:  Richard Patterson, DOB November 11, 1975, MRN [id number]  PCP:  Patient, No Pcp Per  Cardiologist:  No primary care provider on file.  Electrophysiologist:  None   Referring MD: Martin[place], Betty G, MD   No chief complaint on file. ***  History of Present Illness:    Richard Patterson is a 45 y.o. male with a hx of hypertension, tobacco use, type A aortic dissection status post repair who presents for initial visit.  He was admitted to [facility] from 9/29 through 04/29/2020.  Presented with chest pain and found to have type B aortic dissection originating ascending aorta just above sinus of Valsalva with dissection flap extending through entire aorta and terminating in left common iliac artery, causing partial occlusion of SMA.  Underwent emergent replacement of ascending aorta using 30 mm Hemashield graft on 04/23/2020.  He was discharged on amlodipine 5 mg daily, labetalol 300 mg twice daily, lisinopril 20 mg daily, clonidine 0.3 mg twice daily.  He was seen in the ED on 09/02/2020 with chest pain.  CT chest showed no aortic dissection, small amount of mediastinal fluid adjacent to the aorta and pulmonary artery.  Also noted to have round lesion in tail of pancreas, MRI recommended.  Troponins negative x2.  Past Medical History:  Diagnosis Date  . Essential hypertension   . Hypertension     Past Surgical History:  Procedure Laterality Date  . AORTIC VALVE REPAIR N/A 04/22/2020   Procedure: REPAIR OF AORTIC DISSECTION USING HEMASHIELD PLATINUM 30 MM VASCULAR GRAFT.;  Surgeon: Gaye Pollack, MD;  Location: [facility] OR;  Service: Open Heart Surgery;  Laterality: N/A;  Axillary cannulation  . CHOLECYSTECTOMY    . HAND TENDON SURGERY Right     Current Medications: No outpatient medications have been marked as taking for the 09/07/20 encounter (Appointment) with Donato Heinz, MD.     Allergies:   Shellfish allergy and Shellfish allergy    Social History   Socioeconomic History  . Marital status: Single    Spouse name: Not on file  . Number of children: Not on file  . Years of education: Not on file  . Highest education level: Not on file  Occupational History  . Not on file  Tobacco Use  . Smoking status: Former Research scientist (life sciences)  . Smokeless tobacco: Never Used  Substance and Sexual Activity  . Alcohol use: Yes  . Drug use: Never  . Sexual activity: Not Currently    Birth control/protection: None  Other Topics Concern  . Not on file  Social History Narrative   ** Merged History Encounter **       Social Determinants of Health   Financial Resource Strain: Not on file  Food Insecurity: Not on file  Transportation Needs: Not on file  Physical Activity: Not on file  Stress: Not on file  Social Connections: Not on file     Family History: The patient's ***family history includes Cancer in his maternal grandfather and maternal grandmother; Hypertension in his father and mother. There is no history of Heart attack or Heart failure.  ROS:   Please see the history of present illness.    *** All other systems reviewed and are negative.  EKGs/Labs/Other Studies Reviewed:    The following studies were reviewed today: ***  EKG:  EKG is *** ordered today.  The ekg ordered today demonstrates ***  Recent Labs: 04/22/2020: ALT 39 04/24/2020: Magnesium 2.3 09/02/2020:  BUN 15; Creatinine, Ser 0.80; Hemoglobin 17.3; Platelets 231; Potassium 3.5; Sodium 142  Recent Lipid Panel    Component Value Date/Time   CHOL 143 01/21/2019 0830   TRIG 162.0 (H) 01/21/2019 0830   HDL 44.20 01/21/2019 0830   CHOLHDL 3 01/21/2019 0830   VLDL 32.4 01/21/2019 0830   LDLCALC 66 01/21/2019 0830    Physical Exam:    VS:  There were no vitals taken for this visit.    Wt Readings from Last 3 Encounters:  09/02/20 260 lb (117.9 kg)  05/20/20 284 lb 6.4 oz (129 kg)  05/11/20 283 lb 12.8 oz (128.7 kg)     GEN: *** Well nourished, well  developed in no acute distress HEENT: Normal NECK: No JVD; No carotid bruits LYMPHATICS: No lymphadenopathy CARDIAC: ***RRR, no murmurs, rubs, gallops RESPIRATORY:  Clear to auscultation without rales, wheezing or rhonchi  ABDOMEN: Soft, non-tender, non-distended MUSCULOSKELETAL:  No edema; No deformity  SKIN: Warm and dry NEUROLOGIC:  Alert and oriented x 3 PSYCHIATRIC:  Normal affect   ASSESSMENT:    No diagnosis found. PLAN:    Type A aortic dissection: Status post repair on 04/23/2020.  Follows with Dr. Cyndia Bent.  Hypertension: On amlodipine 5 mg daily, labetalol 300 mg twice daily, lisinopril 20 mg daily, clonidine 0.3 mg twice daily.  Tobacco use:  Pancreatic lesion: Noted on CT, MRI recommended.  RTC in***   Medication Adjustments/Labs and Tests Ordered: Current medicines are reviewed at length with the patient today.  Concerns regarding medicines are outlined above.  No orders of the defined types were placed in this encounter.  No orders of the defined types were placed in this encounter.   There are no Patient Instructions on file for this visit.   Signed, Donato Heinz, MD  09/06/2020 1:30 PM    [place]

## 2020-09-07 ENCOUNTER — Ambulatory Visit: Payer: Self-pay | Admitting: Cardiology

## 2020-09-11 ENCOUNTER — Encounter: Payer: Self-pay | Admitting: Family Medicine

## 2020-09-11 ENCOUNTER — Encounter: Payer: No Typology Code available for payment source | Admitting: Family Medicine

## 2020-10-12 ENCOUNTER — Telehealth: Payer: Self-pay | Admitting: General Practice

## 2020-10-12 NOTE — Telephone Encounter (Signed)
Pt call and need a refill on  gabapentin (NEURONTIN) 300 MG capsule sent to  [facility] ([place]),  - [address] Phone:  [phone]  Fax:  [phone]

## 2020-10-13 MED ORDER — GABAPENTIN 300 MG PO CAPS: 300.0000 mg | ORAL_CAPSULE | Freq: Every day | ORAL | 3 refills | Status: DC

## 2020-10-13 NOTE — Telephone Encounter (Signed)
Rx sent in

## 2020-10-16 ENCOUNTER — Telehealth: Payer: Self-pay | Admitting: Family Medicine

## 2020-10-16 DIAGNOSIS — I7100 Dissection of unspecified site of aorta: Secondary | ICD-10-CM

## 2020-10-16 DIAGNOSIS — Z9889 Other specified postprocedural states: Secondary | ICD-10-CM

## 2020-10-16 NOTE — Telephone Encounter (Signed)
Referral placed, they will call pt to schedule appt.

## 2020-10-16 NOTE — Telephone Encounter (Signed)
Pt is calling in stating that he is needing a referral to a Cardiologist and would like to have an appointment Monday or Tuesday.

## 2020-11-12 ENCOUNTER — Ambulatory Visit: Payer: Self-pay | Admitting: Family Medicine

## 2020-12-21 NOTE — Progress Notes (Deleted)
Cardiology Office Note:    Date:  12/21/2020   ID:  Richard Patterson, DOB 26-Feb-1976, MRN [id number]  PCP:  Martinique, Betty G, MD   [facility] Providers Cardiologist:  None { Click to update primary MD,subspecialty MD or APP then REFRESH:1}    CC: *** Consulted for the evaluation of aortic dissection s/p repair at the behest of Martinique, Richard So, MD  History of Present Illness:    Richard Patterson is a 45 y.o. male with a hx of HTN, Aortic Dissection s/p repair who presents for evaluation 12/21/20.  Patient notes that (s)he is feeling ***.  Has had no chest pain, chest pressure, chest tightness, chest stinging ***.  Discomfort occurs with ***, worsens with ***, and improves with ***.  Patient exertion notable for *** with *** and feels no symptoms.  No shortness of breath, DOE ***.  No PND or orthopnea***.  No bendopnea***, weight gain***, leg swelling ***, or abdominal swelling***.  No syncope or near syncope ***. Notes *** no palpitations or funny heart beats.     No history of ***pre-eclampsia, early menarche, or prematurity.  No Fen-Phen or drug use***.  Ambulatory BP ***.   Past Medical History:  Diagnosis Date  . Essential hypertension   . Hypertension     Past Surgical History:  Procedure Laterality Date  . AORTIC VALVE REPAIR N/A 04/22/2020   Procedure: REPAIR OF AORTIC DISSECTION USING HEMASHIELD PLATINUM 30 MM VASCULAR GRAFT.;  Surgeon: Gaye Pollack, MD;  Location: [place] OR;  Service: Open Heart Surgery;  Laterality: N/A;  Axillary cannulation  . CHOLECYSTECTOMY    . HAND TENDON SURGERY Right     Current Medications: No outpatient medications have been marked as taking for the 12/22/20 encounter (Appointment) with Werner Lean, MD.     Allergies:   Shellfish allergy and Shellfish allergy   Social History   Socioeconomic History  . Marital status: Single    Spouse name: Not on file  . Number of children: Not on file  . Years of education:  Not on file  . Highest education level: Not on file  Occupational History  . Not on file  Tobacco Use  . Smoking status: Former Research scientist (life sciences)  . Smokeless tobacco: Never Used  Substance and Sexual Activity  . Alcohol use: Yes  . Drug use: Never  . Sexual activity: Not Currently    Birth control/protection: None  Other Topics Concern  . Not on file  Social History Narrative   ** Merged History Encounter **       Social Determinants of Health   Financial Resource Strain: Not on file  Food Insecurity: Not on file  Transportation Needs: Not on file  Physical Activity: Not on file  Stress: Not on file  Social Connections: Not on file     Family History: The patient's ***family history includes Cancer in his maternal grandfather and maternal grandmother; Hypertension in his father and mother. There is no history of Heart attack or Heart failure.  ROS:   Please see the history of present illness.    *** All other systems reviewed and are negative.  EKGs/Labs/Other Studies Reviewed:    The following studies were reviewed today: ***  EKG:   09/03/20: Sinus Tachycardia with rare PVCs, diffuse TWI rate 101  Transesophageal Echocardiogram: Date: 9/52/84 Results: Complications: No known complications during this procedure.  POST-OP IMPRESSIONS  - Left Ventricle: The left ventricle is unchanged from pre-bypass.  - Aorta: A graft was  placed Arch and Dscending Ao for repair. (proximal Aortic Aneurysm- graft not well visualized post) - Aortic Valve: The aortic valve appears unchanged from pre-bypass.  - Mitral Valve: The mitral valve appears unchanged from pre-bypass.  - Tricuspid Valve: The tricuspid valve appears unchanged from pre-bypass.  - Pericardium: The pericardium appears unchanged from pre-bypass.   Cardiac CT Aorta: Date:09/02/20 Results: IMPRESSION: Postsurgical changes ascending aortic dissection repair without definite evidence of complication, aneurysm or leak. There  is a very small volume of mediastinal fluid adjacent to the aorta and pulmonary artery, this could be further evaluated with ECHO.  Similar appearance of the transverse and descending aortic dissection which terminates in the LEFT common iliac artery, further described above.  Similar appearance of the round lesion at the tail the pancreas with primary considerations of a primary pancreatic lesion versus large splenule. Further evaluation with nonemergent pancreas protocol MRI with and without contrast is recommended  These results were called by telephone at the time of interpretation on 09/02/2020 at 6:07 pm to provider [facility] , who verbally acknowledged these results.   Recent Labs: 04/22/2020: ALT 39 04/24/2020: Magnesium 2.3 09/02/2020: BUN 15; Creatinine, Ser 0.80; Hemoglobin 17.3; Platelets 231; Potassium 3.5; Sodium 142  Recent Lipid Panel    Component Value Date/Time   CHOL 143 01/21/2019 0830   TRIG 162.0 (H) 01/21/2019 0830   HDL 44.20 01/21/2019 0830   CHOLHDL 3 01/21/2019 0830   VLDL 32.4 01/21/2019 0830   LDLCALC 66 01/21/2019 0830     Risk Assessment/Calculations:     NA  Physical Exam:    VS:  There were no vitals taken for this visit.    Wt Readings from Last 3 Encounters:  09/02/20 117.9 kg  05/20/20 129 kg  05/11/20 128.7 kg     GEN: *** Well nourished, well developed in no acute distress HEENT: Normal NECK: No JVD; No carotid bruits LYMPHATICS: No lymphadenopathy CARDIAC: ***RRR, no murmurs, rubs, gallops RESPIRATORY:  Clear to auscultation without rales, wheezing or rhonchi  ABDOMEN: Soft, non-tender, non-distended MUSCULOSKELETAL:  No edema; No deformity  SKIN: Warm and dry NEUROLOGIC:  Alert and oriented x 3 PSYCHIATRIC:  Normal affect   ASSESSMENT:    No diagnosis found. PLAN:    In order of problems listed above:  Thoracic Aortic Aneurysm with Dissection s/p Repair with no other aortopathy HTN - unclear family  history *** - unable to tissue character aorto-pulmonary confluence; will get MRA Aotra - unclear valve disease, will get baseline echo (guidelines represent yearly echo and CT or MR)   ***Asymptomatic thoracic/abdominal aortic aneurysm - Primary Prevention:  HTN, CAD, Smoking Cessation  - 1st degree relative one time screening ***  - Discussed not using Fluoroquinolones - *** no history of syphilis Patterson deferring TPHA, micro-haemagglutination test, flourescent treponemal antibody absorption test and no 21 days of therapy with benzathine penicillin after surgery would be required; age < 76 and no HIV history  Genetic Considerations*** With Marfan's Syndrome- we would consider surgery at  4.0 cm if considering pregnancy/4.5 cm  with risk factors/ 5.0 cm With Loey's Dietz's Syndrome- we would consider surgery at  4.0 cm if considering pregnancy/4.2 cm; would benefit from vertebral artery CT - uvula is ***  With Bicuspid Valve 4.5 cm with SAVR/5.0 cm with risk factors  With Turner's Syndrome- we would consider surgery at  4.0 cm or BSI indexed diabetes of 27.5 With Fredderick Phenix' Danlos Syndrome will discuss risks and speciality referral for highly friable vascular tissue With  Non-Syndromic Heritable Thoracic Aortic Disease (nsHTAD) with ACT2a/MHY11/MYLK/TGFBR2/FBN1/PRKG1/SMAD3 gene mutations     {Are you ordering a CV Procedure (e.Patterson. stress test, cath, DCCV, TEE, etc)?   Press F2        :[id number]}    Medication Adjustments/Labs and Tests Ordered: Current medicines are reviewed at length with the patient today.  Concerns regarding medicines are outlined above.  No orders of the defined types were placed in this encounter.  No orders of the defined types were placed in this encounter.   There are no Patient Instructions on file for this visit.   Signed, Werner Lean, MD  12/21/2020 9:27 AM    [facility]

## 2020-12-22 ENCOUNTER — Ambulatory Visit: Payer: Self-pay | Admitting: Internal Medicine

## 2020-12-25 ENCOUNTER — Other Ambulatory Visit: Payer: Self-pay

## 2020-12-25 ENCOUNTER — Ambulatory Visit (INDEPENDENT_AMBULATORY_CARE_PROVIDER_SITE_OTHER): Payer: Self-pay | Admitting: Family Medicine

## 2020-12-25 ENCOUNTER — Encounter: Payer: Self-pay | Admitting: Family Medicine

## 2020-12-25 VITALS — BP 142/88 | HR 84 | Temp 98.1°F | Wt 286.3 lb

## 2020-12-25 DIAGNOSIS — D751 Secondary polycythemia: Secondary | ICD-10-CM

## 2020-12-25 DIAGNOSIS — R202 Paresthesia of skin: Secondary | ICD-10-CM

## 2020-12-25 DIAGNOSIS — M5416 Radiculopathy, lumbar region: Secondary | ICD-10-CM

## 2020-12-25 DIAGNOSIS — R42 Dizziness and giddiness: Secondary | ICD-10-CM

## 2020-12-25 DIAGNOSIS — I10 Essential (primary) hypertension: Secondary | ICD-10-CM

## 2020-12-25 NOTE — Progress Notes (Signed)
Established Patient Office Visit  Subjective:  Patient ID: Richard Patterson, male    DOB: 1976-01-01  Age: 45 y.o. MRN: [id number]  CC:  Chief Complaint  Patient presents with  . Dizziness    Dizzy spells, usually during the night but he works 3rd shift, last roughly an hour each time, numbness in the legs and feet that comes and goes. Severe back pain when going from sitting to standing, only last for a few steps, had heart surgery in the past year. Pt states he did not have any of these issues until after surgery.    HPI Richard Patterson presents for several items as below  Severe low back pain for many months.  He saw his primary and an MRI was ordered but for some reason he was never called for an appointment.  He has somewhat bilateral lower lumbar pain worse when going from seated to standing position.  He is having frequent symptoms of numbness in the left thigh and occasional numbness dorsum right foot.  Has radiculitis symptoms on the left greater than right.  No urine or stool incontinence.  Denies any specific injury.  Other issues he states he has had some recent "dizzy spells ".  Denies any vertigo.  No syncope.  Symptoms have occurred more at work.  Works third shift.  No clear provoking factors.   No alleviating factors.   No chest pain.    Medical history is significant for history of aortic dissection last fall.  He has hypertension and history of chronic polycythemia.  He did have CT angiogram per thoracic surgery back in February which showed postsurgical changes of the ascending aorta without any evidence for aneurysm or leak.  Past Medical History:  Diagnosis Date  . Essential hypertension   . Hypertension     Past Surgical History:  Procedure Laterality Date  . AORTIC VALVE REPAIR N/A 04/22/2020   Procedure: REPAIR OF AORTIC DISSECTION USING HEMASHIELD PLATINUM 30 MM VASCULAR GRAFT.;  Surgeon: Gaye Pollack, MD;  Location: [place] OR;  Service: Open Heart  Surgery;  Laterality: N/A;  Axillary cannulation  . CHOLECYSTECTOMY    . HAND TENDON SURGERY Right     Family History  Problem Relation Age of Onset  . Hypertension Mother   . Hypertension Father   . Cancer Maternal Grandmother   . Cancer Maternal Grandfather   . Heart attack Neg Hx   . Heart failure Neg Hx     Social History   Socioeconomic History  . Marital status: Single    Spouse name: Not on file  . Number of children: Not on file  . Years of education: Not on file  . Highest education level: Not on file  Occupational History  . Not on file  Tobacco Use  . Smoking status: Former Research scientist (life sciences)  . Smokeless tobacco: Never Used  Substance and Sexual Activity  . Alcohol use: Yes  . Drug use: Never  . Sexual activity: Not Currently    Birth control/protection: None  Other Topics Concern  . Not on file  Social History Narrative   ** Merged History Encounter **       Social Determinants of Health   Financial Resource Strain: Not on file  Food Insecurity: Not on file  Transportation Needs: Not on file  Physical Activity: Not on file  Stress: Not on file  Social Connections: Not on file  Intimate Partner Violence: Not on file    Outpatient Medications Prior to  Visit  Medication Sig Dispense Refill  . amLODipine (NORVASC) 5 MG tablet Take 5 mg by mouth daily.    Marland Kitchen amLODipine (NORVASC) 5 MG tablet Take 1 tablet (5 mg total) by mouth daily. 30 tablet 0  . amLODipine (NORVASC) 5 MG tablet Take 1 tablet (5 mg total) by mouth daily. 30 tablet 3  . aspirin 325 MG EC tablet Take 325 mg by mouth daily.    Marland Kitchen aspirin 325 MG EC tablet Take 1 tablet (325 mg total) by mouth daily. 30 tablet 3  . cloNIDine (CATAPRES) 0.2 MG tablet Take 0.3 mg by mouth 2 (two) times daily.    . cloNIDine (CATAPRES) 0.2 MG tablet Take 1.5 tablets (0.3 mg total) by mouth 2 (two) times daily. 30 tablet 0  . cloNIDine (CATAPRES) 0.2 MG tablet Take 1.5 tablets (0.3 mg total) by mouth 2 (two) times daily.  60 tablet 3  . DULoxetine (CYMBALTA) 30 MG capsule Take 1 capsule (30 mg total) by mouth daily. 30 capsule 1  . gabapentin (NEURONTIN) 300 MG capsule Take 1 capsule (300 mg total) by mouth at bedtime. 30 capsule 3  . HYDROcodone-acetaminophen (NORCO/VICODIN) 5-325 MG tablet Take 1 tablet by mouth every 6 (six) hours as needed for moderate pain or severe pain. 8 tablet 0  . labetalol (NORMODYNE) 200 MG tablet Take 300 mg by mouth 2 (two) times daily.    Marland Kitchen labetalol (NORMODYNE) 200 MG tablet Take 1.5 tablets (300 mg total) by mouth 2 (two) times daily. 30 tablet 0  . labetalol (NORMODYNE) 200 MG tablet Take 1.5 tablets (300 mg total) by mouth 2 (two) times daily. 60 tablet 3  . lisinopril (ZESTRIL) 20 MG tablet Take 20 mg by mouth daily.    Marland Kitchen lisinopril (ZESTRIL) 20 MG tablet Take 1 tablet (20 mg total) by mouth daily. 30 tablet 3  . pantoprazole (PROTONIX) 40 MG tablet Take 1 tablet (40 mg total) by mouth daily. 30 tablet 3   No facility-administered medications prior to visit.    Allergies  Allergen Reactions  . Shellfish Allergy Hives and Rash  . Shellfish Allergy Hives, Itching, Swelling and Rash    ROS Review of Systems  Constitutional: Negative for appetite change, chills and unexpected weight change.  Respiratory: Negative for cough.   Cardiovascular: Negative for chest pain.  Gastrointestinal: Negative for abdominal pain.  Musculoskeletal: Positive for back pain.      Objective:    Physical Exam Vitals reviewed.  Constitutional:      Appearance: Normal appearance.  Musculoskeletal:     Right lower leg: No edema.     Left lower leg: No edema.     Comments: Spinal tenderness.  Straight leg raise are negative bilaterally  Neurological:     Mental Status: He is alert.     Comments: Full strength with plantarflexion, dorsiflexion, and knee extension bilaterally.  He has trace knee reflexes bilaterally and 1+ ankle bilaterally     BP (!) 142/88 (BP Location: Left Arm,  Cuff Size: Normal)   Pulse 84   Temp 98.1 F (36.7 C) (Oral)   Wt 286 lb 4.8 oz (129.9 kg)   SpO2 99%   BMI 37.77 kg/m  Wt Readings from Last 3 Encounters:  12/25/20 286 lb 4.8 oz (129.9 kg)  09/02/20 260 lb (117.9 kg)  05/20/20 284 lb 6.4 oz (129 kg)     Health Maintenance Due  Topic Date Due  . COVID-19 Vaccine (1) Never done  . Pneumococcal Vaccine 0-64  Years old (1 of 2 - PPSV23) Never done  . HIV Screening  Never done  . Hepatitis C Screening  Never done  . TETANUS/TDAP  Never done  . COLONOSCOPY (Pts 45-46yrs Insurance coverage will need to be confirmed)  Never done    There are no preventive care reminders to display for this patient.  Lab Results  Component Value Date   TSH 3.18 06/07/2016   Lab Results  Component Value Date   WBC 8.0 09/02/2020   HGB 17.3 (H) 09/02/2020   HCT 51.0 09/02/2020   MCV 87.2 09/02/2020   PLT 231 09/02/2020   Lab Results  Component Value Date   NA 142 09/02/2020   K 3.5 09/02/2020   CO2 25 09/02/2020   GLUCOSE 74 09/02/2020   BUN 15 09/02/2020   CREATININE 0.80 09/02/2020   BILITOT 1.7 (H) 04/22/2020   ALKPHOS 58 04/22/2020   AST 34 04/22/2020   ALT 39 04/22/2020   PROT 6.6 04/22/2020   ALBUMIN 3.5 04/22/2020   CALCIUM 9.5 09/02/2020   ANIONGAP 10 09/02/2020   GFR 119.59 12/11/2019   Lab Results  Component Value Date   CHOL 143 01/21/2019   Lab Results  Component Value Date   HDL 44.20 01/21/2019   Lab Results  Component Value Date   LDLCALC 66 01/21/2019   Lab Results  Component Value Date   TRIG 162.0 (H) 01/21/2019   Lab Results  Component Value Date   CHOLHDL 3 01/21/2019   Lab Results  Component Value Date   HGBA1C 5.2 04/25/2020      Assessment & Plan:   #1 ongoing back pain lower lumbar area with occasional radiculitis symptoms left greater than right.  He does report some intermittent paresthesias left thigh and right foot.  -Reorder MRI which had been ordered previously but for some  reason was never done  #2 paresthesias possibly related #1. -Check B12 level  #3 hypertension.  Repeat reading did come down some after rest.  We recommend close monitoring.  He is on multiple medications for hypertension and is encouraged to continue with those regularly.  #4 nonspecific dizziness.  He is not describing any consistent orthostatic type changes.  Check CBC, basic metabolic panel  #5 history of polycythemia.  Quit smoking over a year ago.  No known family history of polycythemia vera -Recheck CBC  No orders of the defined types were placed in this encounter.   Follow-up: No follow-ups on file.    Carolann Littler, MD

## 2020-12-25 NOTE — Patient Instructions (Signed)
We will re-order the MRI scan of the back.

## 2020-12-29 ENCOUNTER — Other Ambulatory Visit: Payer: Self-pay

## 2021-01-12 ENCOUNTER — Other Ambulatory Visit: Payer: Self-pay

## 2021-01-12 ENCOUNTER — Ambulatory Visit
Admission: RE | Admit: 2021-01-12 | Discharge: 2021-01-12 | Disposition: A | Discharge status: Home / Self Care | Payer: Self-pay | Source: Ambulatory Visit | Attending: Family Medicine | Admitting: Family Medicine

## 2021-01-12 DIAGNOSIS — M5416 Radiculopathy, lumbar region: Secondary | ICD-10-CM

## 2021-01-14 ENCOUNTER — Other Ambulatory Visit: Payer: Self-pay

## 2021-01-14 ENCOUNTER — Telehealth: Payer: Self-pay | Admitting: Family Medicine

## 2021-01-14 DIAGNOSIS — M5416 Radiculopathy, lumbar region: Secondary | ICD-10-CM

## 2021-01-14 NOTE — Telephone Encounter (Signed)
MRI was ordered by Dr. Martinique.

## 2021-01-14 NOTE — Telephone Encounter (Signed)
Patient saw Dr. Elease Hashimoto the beginning of June for back pain.  He had an MRI done but hasn't heard the results yet.  They told him if he didn't hear anything before Thursday to give the office a call.  He states he is still in a lot of pain.

## 2021-01-15 ENCOUNTER — Telehealth: Payer: Self-pay | Admitting: Family Medicine

## 2021-01-15 NOTE — Telephone Encounter (Signed)
Please refer to prior telephone encounter.

## 2021-01-15 NOTE — Telephone Encounter (Signed)
I spoke with the pt and he was informed of the results yesterday but would like further clarification of these results by PCP. Please advise.

## 2021-01-15 NOTE — Telephone Encounter (Signed)
Virtual visit scheduled for 06/28

## 2021-01-15 NOTE — Telephone Encounter (Signed)
Pt has called the office back

## 2021-01-15 NOTE — Telephone Encounter (Signed)
He saw Dr Elease Hashimoto but I will be glad to meet with him to discuss lab results. Thanks, BJ

## 2021-01-15 NOTE — Telephone Encounter (Signed)
Left a message for the pt to return my call.  

## 2021-01-19 ENCOUNTER — Encounter: Payer: Self-pay | Admitting: Family Medicine

## 2021-01-19 ENCOUNTER — Telehealth (INDEPENDENT_AMBULATORY_CARE_PROVIDER_SITE_OTHER): Payer: Self-pay | Admitting: Family Medicine

## 2021-01-19 VITALS — Ht 73.0 in

## 2021-01-19 DIAGNOSIS — I10 Essential (primary) hypertension: Secondary | ICD-10-CM

## 2021-01-19 DIAGNOSIS — M5416 Radiculopathy, lumbar region: Secondary | ICD-10-CM

## 2021-01-19 MED ORDER — AMLODIPINE BESYLATE 5 MG PO TABS: 5.0000 mg | ORAL_TABLET | Freq: Every day | ORAL | 2 refills | Status: DC

## 2021-01-19 MED ORDER — CLONIDINE HCL 0.3 MG PO TABS: 0.3000 mg | ORAL_TABLET | Freq: Two times a day (BID) | ORAL | 1 refills | Status: DC

## 2021-01-19 MED ORDER — GABAPENTIN 300 MG PO CAPS: 300.0000 mg | ORAL_CAPSULE | Freq: Every day | ORAL | 2 refills | Status: AC

## 2021-01-19 MED ORDER — LABETALOL HCL 300 MG PO TABS: 300.0000 mg | ORAL_TABLET | Freq: Two times a day (BID) | ORAL | 1 refills | Status: DC

## 2021-01-19 MED ORDER — LISINOPRIL 20 MG PO TABS: 20.0000 mg | ORAL_TABLET | Freq: Every day | ORAL | 2 refills | Status: DC

## 2021-01-19 MED ORDER — GABAPENTIN 300 MG PO CAPS: 300.0000 mg | ORAL_CAPSULE | Freq: Every day | ORAL | 2 refills | Status: DC

## 2021-01-19 NOTE — Progress Notes (Signed)
Patient scheduled with Dr. Martinique on 09/28 at 2 PM

## 2021-01-19 NOTE — Assessment & Plan Note (Signed)
Problem is getting worse.  In the past epidural injection helped with similar symptoms, he would like to see the same provider.  Referral to see Dr. Ernestina Patches placed. Continue gabapentin 300 mg at bedtime. Instructed about warning signs.

## 2021-01-19 NOTE — Progress Notes (Signed)
Virtual Visit via Video Note I connected with Richard Patterson on 01/19/21 by a video enabled telemedicine application and verified that I am speaking with the correct person using two identifiers.  Location patient: home Location provider:work or home office Persons participating in the virtual visit: patient, provider  I discussed the limitations of evaluation and management by telemedicine and the availability of in person appointments. The patient expressed understanding and agreed to proceed.  Chief Complaint  Patient presents with   Results   HPI: Richard. Szumski is a 45 yo male with Hx of resistant HTN,HLD,and aortic dissection s/p repair/vascular graff 04/22/20 who recently had lumbar MRI done and would like to go through results. 01/12/21 lumbar MRI:1. Degenerative disc bulging at L5-S1 with resultant moderate bilateral L5 foraminal stenosis. Associated prominent discogenic endplate change with reactive marrow edema at this level, which could contribute to underlying back pain. 2. Small left extraforaminal disc protrusion at L1-2, closely approximating and potentially irritating the exiting left L1 nerve root. 3. Known aortic dissection involving the intra-abdominal aorta with extension into the left iliac arteries, grossly similar to previous CTA.  Lower back pain worse since 03/2021, after vascular procedure. LLE needle and pins sensation and right foot numbness.  Pain is getting worse, usually after prolonged rest and better after movement. He has had epidural injections in the past and it helped.  Pain interferes with sleep.  Gabapentin 300 mg at bedtime has helped some, he sleeps better when he takes it.  Hypertension: He is not monitoring BP at home. BP has been elevated during recent visits. Currently he is on lisinopril 20 mg daily, labetalol 300 mg twice daily,amlodipine 5 mg daily, and clonidine 0.3 mg twice daily. Negative for severe/frequent headache, visual changes, CP,  palpitations, gross hematuria, decreased urine output, foamy urine, or worsening edema.  Lab Results  Component Value Date   CREATININE 0.80 09/02/2020   BUN 15 09/02/2020   NA 142 09/02/2020   K 3.5 09/02/2020   CL 105 09/02/2020   CO2 25 09/02/2020   ROS: See pertinent positives and negatives per HPI.  Past Medical History:  Diagnosis Date   Essential hypertension    Hypertension     Past Surgical History:  Procedure Laterality Date   AORTIC VALVE REPAIR N/A 04/22/2020   Procedure: REPAIR OF AORTIC DISSECTION USING HEMASHIELD PLATINUM 30 MM VASCULAR GRAFT.;  Surgeon: Gaye Pollack, MD;  Location: [facility];  Service: Open Heart Surgery;  Laterality: N/A;  Axillary cannulation   CHOLECYSTECTOMY     HAND TENDON SURGERY Right     Family History  Problem Relation Age of Onset   Hypertension Mother    Hypertension Father    Cancer Maternal Grandmother    Cancer Maternal Grandfather    Heart attack Neg Hx    Heart failure Neg Hx     Social History   Socioeconomic History   Marital status: Single    Spouse name: Not on file   Number of children: Not on file   Years of education: Not on file   Highest education level: Not on file  Occupational History   Not on file  Tobacco Use   Smoking status: Former    Pack years: 0.00   Smokeless tobacco: Never  Substance and Sexual Activity   Alcohol use: Yes   Drug use: Never   Sexual activity: Not Currently    Birth control/protection: None  Other Topics Concern   Not on file  Social History Narrative   **  Merged History Encounter **       Social Determinants of Health   Financial Resource Strain: Not on file  Food Insecurity: Not on file  Transportation Needs: Not on file  Physical Activity: Not on file  Stress: Not on file  Social Connections: Not on file  Intimate Partner Violence: Not on file    Current Outpatient Medications:    aspirin 325 MG EC tablet, Take 1 tablet (325 mg total) by mouth daily., Disp:  30 tablet, Rfl: 3   HYDROcodone-acetaminophen (NORCO/VICODIN) 5-325 MG tablet, Take 1 tablet by mouth every 6 (six) hours as needed for moderate pain or severe pain., Disp: 8 tablet, Rfl: 0   pantoprazole (PROTONIX) 40 MG tablet, Take 1 tablet (40 mg total) by mouth daily., Disp: 30 tablet, Rfl: 3   amLODipine (NORVASC) 5 MG tablet, Take 1 tablet (5 mg total) by mouth daily., Disp: 90 tablet, Rfl: 2   cloNIDine (CATAPRES) 0.3 MG tablet, Take 1 tablet (0.3 mg total) by mouth 2 (two) times daily., Disp: 180 tablet, Rfl: 1   gabapentin (NEURONTIN) 300 MG capsule, Take 1 capsule (300 mg total) by mouth at bedtime., Disp: 30 capsule, Rfl: 2   labetalol (NORMODYNE) 300 MG tablet, Take 1 tablet (300 mg total) by mouth 2 (two) times daily., Disp: 180 tablet, Rfl: 1   lisinopril (ZESTRIL) 20 MG tablet, Take 1 tablet (20 mg total) by mouth daily., Disp: 90 tablet, Rfl: 2  EXAM:  VITALS per patient if applicable:Ht 6\' 1"  (1.854 m)   BMI 37.77 kg/m   GENERAL: alert, oriented, appears well and in no acute distress  HEENT: atraumatic, conjunttiva clear, no obvious abnormalities on inspection.  NECK: normal movements of the head and neck  LUNGS: on inspection no signs of respiratory distress, breathing rate appears normal, no obvious gross SOB, gasping or wheezing  CV: no obvious cyanosis  MS: moves all visible extremities without noticeable abnormality  PSYCH/NEURO: pleasant and cooperative, no obvious depression or anxiety, speech and thought processing grossly intact  ASSESSMENT AND PLAN:  Discussed the following assessment and plan: Orders Placed This Encounter  Procedures   Ambulatory referral to Physical Medicine Rehab    Resistant hypertension BP has been elevated during prior visits. Strongly recommend monitoring BP at home. For now continue amlodipine 5 mg daily, clonidine 0.3 mg twice daily, labetalol 300 mg twice daily, and lisinopril 20 mg daily. Low-salt diet. Follow-up in 3  months.  Lumbar radiculopathy Problem is getting worse.  In the past epidural injection helped with similar symptoms, he would like to see the same provider.  Referral to see Dr. Ernestina Patches placed. Continue gabapentin 300 mg at bedtime. Instructed about warning signs.  We discussed possible serious and likely etiologies, options for evaluation and workup, limitations of telemedicine visit vs in person visit, treatment, treatment risks and precautions.  I discussed the assessment and treatment plan with the patient. The patient was provided an opportunity to ask questions and all were answered. The patient agreed with the plan and demonstrated an understanding of the instructions.  Return in about 3 months (around 04/21/2021) for HTN and fasting labs.  Ivionna Verley [place], MD

## 2021-01-19 NOTE — Assessment & Plan Note (Signed)
BP has been elevated during prior visits. Strongly recommend monitoring BP at home. For now continue amlodipine 5 mg daily, clonidine 0.3 mg twice daily, labetalol 300 mg twice daily, and lisinopril 20 mg daily. Low-salt diet. Follow-up in 3 months.

## 2021-01-22 ENCOUNTER — Encounter: Payer: Self-pay | Admitting: Family Medicine

## 2021-02-02 ENCOUNTER — Other Ambulatory Visit: Payer: Self-pay | Admitting: Cardiovascular Disease

## 2021-02-02 DIAGNOSIS — I10 Essential (primary) hypertension: Secondary | ICD-10-CM

## 2021-03-12 ENCOUNTER — Telehealth: Payer: Self-pay | Admitting: Physical Medicine and Rehabilitation

## 2021-03-12 NOTE — Telephone Encounter (Signed)
Patient returned call asked for a call back. Patient said he is off on Monday and will be on break today another 20 minutes. The number to contact patient is [phone]

## 2021-03-22 ENCOUNTER — Ambulatory Visit: Payer: Self-pay | Admitting: Physical Medicine and Rehabilitation

## 2021-04-10 ENCOUNTER — Other Ambulatory Visit: Payer: Self-pay | Admitting: Cardiology

## 2021-04-10 DIAGNOSIS — I10 Essential (primary) hypertension: Secondary | ICD-10-CM

## 2021-04-13 ENCOUNTER — Ambulatory Visit: Payer: Self-pay | Admitting: Physical Medicine and Rehabilitation

## 2021-04-21 ENCOUNTER — Ambulatory Visit: Payer: Self-pay | Admitting: Family Medicine

## 2021-06-15 ENCOUNTER — Other Ambulatory Visit: Payer: Self-pay

## 2021-06-15 ENCOUNTER — Observation Stay (HOSPITAL_COMMUNITY)
Admission: EM | Admit: 2021-06-15 | Discharge: 2021-06-17 | Disposition: A | Discharge status: Home / Self Care | Payer: Self-pay | Attending: Cardiology | Admitting: Cardiology

## 2021-06-15 ENCOUNTER — Emergency Department (HOSPITAL_COMMUNITY): Payer: Self-pay

## 2021-06-15 DIAGNOSIS — D45 Polycythemia vera: Secondary | ICD-10-CM

## 2021-06-15 DIAGNOSIS — F17200 Nicotine dependence, unspecified, uncomplicated: Secondary | ICD-10-CM | POA: Insufficient documentation

## 2021-06-15 DIAGNOSIS — R079 Chest pain, unspecified: Secondary | ICD-10-CM | POA: Diagnosis present

## 2021-06-15 DIAGNOSIS — Z8679 Personal history of other diseases of the circulatory system: Secondary | ICD-10-CM

## 2021-06-15 DIAGNOSIS — R200 Anesthesia of skin: Secondary | ICD-10-CM | POA: Insufficient documentation

## 2021-06-15 DIAGNOSIS — R778 Other specified abnormalities of plasma proteins: Secondary | ICD-10-CM | POA: Insufficient documentation

## 2021-06-15 DIAGNOSIS — Z79899 Other long term (current) drug therapy: Secondary | ICD-10-CM | POA: Insufficient documentation

## 2021-06-15 DIAGNOSIS — Z87891 Personal history of nicotine dependence: Secondary | ICD-10-CM | POA: Insufficient documentation

## 2021-06-15 DIAGNOSIS — Z6836 Body mass index (BMI) 36.0-36.9, adult: Secondary | ICD-10-CM | POA: Insufficient documentation

## 2021-06-15 DIAGNOSIS — Z20822 Contact with and (suspected) exposure to covid-19: Secondary | ICD-10-CM | POA: Insufficient documentation

## 2021-06-15 DIAGNOSIS — R9439 Abnormal result of other cardiovascular function study: Secondary | ICD-10-CM

## 2021-06-15 DIAGNOSIS — I21A1 Myocardial infarction type 2: Secondary | ICD-10-CM

## 2021-06-15 DIAGNOSIS — R072 Precordial pain: Secondary | ICD-10-CM | POA: Diagnosis present

## 2021-06-15 DIAGNOSIS — I161 Hypertensive emergency: Principal | ICD-10-CM | POA: Insufficient documentation

## 2021-06-15 DIAGNOSIS — F17210 Nicotine dependence, cigarettes, uncomplicated: Secondary | ICD-10-CM

## 2021-06-15 DIAGNOSIS — I10 Essential (primary) hypertension: Secondary | ICD-10-CM

## 2021-06-15 LAB — COMPREHENSIVE METABOLIC PANEL
ALT: 36 U/L (ref 0–44)
AST: 39 U/L (ref 15–41)
Albumin: 3.7 g/dL (ref 3.5–5.0)
Alkaline Phosphatase: 66 U/L (ref 38–126)
Anion gap: 9 (ref 5–15)
BUN: 13 mg/dL (ref 6–20)
CO2: 24 mmol/L (ref 22–32)
Calcium: 8.8 mg/dL — ABNORMAL LOW (ref 8.9–10.3)
Chloride: 106 mmol/L (ref 98–111)
Creatinine, Ser: 0.92 mg/dL (ref 0.61–1.24)
GFR, Estimated: >60 mL/min (ref >60)
Glucose, Bld: 101 mg/dL — ABNORMAL HIGH (ref 70–99)
Potassium: 3.6 mmol/L (ref 3.5–5.1)
Sodium: 139 mmol/L (ref 135–145)
Total Bilirubin: 0.9 mg/dL (ref 0.3–1.2)
Total Protein: 6.9 g/dL (ref 6.5–8.1)

## 2021-06-15 LAB — CBC WITH DIFFERENTIAL/PLATELET
Abs Immature Granulocytes: 0.03 10*3/uL (ref 0.00–0.07)
Basophils Absolute: 0.1 10*3/uL (ref 0.0–0.1)
Basophils Relative: 1 %
Eosinophils Absolute: 0.2 10*3/uL (ref 0.0–0.5)
Eosinophils Relative: 2 %
HCT: 53.3 % — ABNORMAL HIGH (ref 39.0–52.0)
Hemoglobin: 19 g/dL — ABNORMAL HIGH (ref 13.0–17.0)
Immature Granulocytes: 0 %
Lymphocytes Relative: 28 %
Lymphs Abs: 2.7 10*3/uL (ref 0.7–4.0)
MCH: 32.1 pg (ref 26.0–34.0)
MCHC: 35.6 g/dL (ref 30.0–36.0)
MCV: 90 fL (ref 80.0–100.0)
Monocytes Absolute: 0.7 10*3/uL (ref 0.1–1.0)
Monocytes Relative: 7 %
Neutro Abs: 6.2 10*3/uL (ref 1.7–7.7)
Neutrophils Relative %: 62 %
Platelets: 172 10*3/uL (ref 150–400)
RBC: 5.92 MIL/uL — ABNORMAL HIGH (ref 4.22–5.81)
RDW: 12.6 % (ref 11.5–15.5)
WBC: 9.8 10*3/uL (ref 4.0–10.5)
nRBC: 0 % (ref 0.0–0.2)

## 2021-06-15 LAB — CBC
HCT: 49.5 % (ref 39.0–52.0)
Hemoglobin: 17.5 g/dL — ABNORMAL HIGH (ref 13.0–17.0)
MCH: 32.2 pg (ref 26.0–34.0)
MCHC: 35.4 g/dL (ref 30.0–36.0)
MCV: 91.2 fL (ref 80.0–100.0)
Platelets: 168 10*3/uL (ref 150–400)
RBC: 5.43 MIL/uL (ref 4.22–5.81)
RDW: 13.1 % (ref 11.5–15.5)
WBC: 10.8 10*3/uL — ABNORMAL HIGH (ref 4.0–10.5)
nRBC: 0 % (ref 0.0–0.2)

## 2021-06-15 LAB — URINALYSIS, ROUTINE W REFLEX MICROSCOPIC
Bilirubin Urine: NEGATIVE
Glucose, UA: NEGATIVE mg/dL
Hgb urine dipstick: NEGATIVE
Ketones, ur: NEGATIVE mg/dL
Leukocytes,Ua: NEGATIVE
Nitrite: NEGATIVE
Protein, ur: NEGATIVE mg/dL
Specific Gravity, Urine: >1.046 — ABNORMAL HIGH (ref 1.005–1.030)
pH: 5 (ref 5.0–8.0)

## 2021-06-15 LAB — RAPID URINE DRUG SCREEN, HOSP PERFORMED
Amphetamines: NOT DETECTED
Barbiturates: NOT DETECTED
Benzodiazepines: NOT DETECTED
Cocaine: NOT DETECTED
Opiates: POSITIVE — AB
Tetrahydrocannabinol: NOT DETECTED

## 2021-06-15 LAB — I-STAT CHEM 8, ED
BUN: 16 mg/dL (ref 6–20)
Calcium, Ion: 1.09 mmol/L — ABNORMAL LOW (ref 1.15–1.40)
Chloride: 105 mmol/L (ref 98–111)
Creatinine, Ser: 0.8 mg/dL (ref 0.61–1.24)
Glucose, Bld: 96 mg/dL (ref 70–99)
HCT: 56 % — ABNORMAL HIGH (ref 39.0–52.0)
Hemoglobin: 19 g/dL — ABNORMAL HIGH (ref 13.0–17.0)
Potassium: 3.5 mmol/L (ref 3.5–5.1)
Sodium: 141 mmol/L (ref 135–145)
TCO2: 26 mmol/L (ref 22–32)

## 2021-06-15 LAB — CREATININE, SERUM
Creatinine, Ser: 0.95 mg/dL (ref 0.61–1.24)
GFR, Estimated: >60 mL/min (ref >60)

## 2021-06-15 LAB — TSH: TSH: 3.525 u[IU]/mL (ref 0.350–4.500)

## 2021-06-15 LAB — PROTIME-INR
INR: 1 (ref 0.8–1.2)
Prothrombin Time: 13.4 seconds (ref 11.4–15.2)

## 2021-06-15 LAB — MAGNESIUM: Magnesium: 2 mg/dL (ref 1.7–2.4)

## 2021-06-15 LAB — HIV ANTIBODY (ROUTINE TESTING W REFLEX): HIV Screen 4th Generation wRfx: NONREACTIVE

## 2021-06-15 LAB — RESP PANEL BY RT-PCR (FLU A&B, COVID) ARPGX2
Influenza A by PCR: NEGATIVE
Influenza B by PCR: NEGATIVE
SARS Coronavirus 2 by RT PCR: NEGATIVE

## 2021-06-15 LAB — TROPONIN I (HIGH SENSITIVITY)
Troponin I (High Sensitivity): 31 ng/L — ABNORMAL HIGH (ref <18)
Troponin I (High Sensitivity): 34 ng/L — ABNORMAL HIGH (ref <18)

## 2021-06-15 LAB — LIPASE, BLOOD: Lipase: 25 U/L (ref 11–51)

## 2021-06-15 LAB — BRAIN NATRIURETIC PEPTIDE: B Natriuretic Peptide: 204.3 pg/mL — ABNORMAL HIGH (ref 0.0–100.0)

## 2021-06-15 MED ORDER — MORPHINE SULFATE (PF) 4 MG/ML IV SOLN
4.0000 mg | INTRAVENOUS | Status: DC | PRN
  Administered 2021-06-17: 4 mg via INTRAVENOUS
  Filled 2021-06-15: qty 1

## 2021-06-15 MED ORDER — LACTATED RINGERS IV BOLUS
1000.0000 mL | Freq: Once | INTRAVENOUS | Status: AC
  Administered 2021-06-15: 1000 mL via INTRAVENOUS

## 2021-06-15 MED ORDER — LISINOPRIL 20 MG PO TABS
20.0000 mg | ORAL_TABLET | Freq: Every day | ORAL | Status: DC
  Administered 2021-06-15 – 2021-06-16 (×2): 20 mg via ORAL
  Filled 2021-06-15 (×2): qty 1

## 2021-06-15 MED ORDER — ONDANSETRON HCL 4 MG/2ML IJ SOLN: 4.0000 mg | Freq: Four times a day (QID) | INTRAMUSCULAR | Status: DC | PRN

## 2021-06-15 MED ORDER — ESMOLOL HCL-SODIUM CHLORIDE 2000 MG/100ML IV SOLN
25.0000 ug/kg/min | INTRAVENOUS | Status: DC
  Administered 2021-06-15: 25 ug/kg/min via INTRAVENOUS
  Filled 2021-06-15: qty 100

## 2021-06-15 MED ORDER — NITROGLYCERIN 2 % TD OINT
1.0000 [in_us] | TOPICAL_OINTMENT | Freq: Once | TRANSDERMAL | Status: AC
  Administered 2021-06-15: 1 [in_us] via TOPICAL
  Filled 2021-06-15: qty 1

## 2021-06-15 MED ORDER — LABETALOL HCL 5 MG/ML IV SOLN
20.0000 mg | Freq: Once | INTRAVENOUS | Status: AC
  Administered 2021-06-15: 20 mg via INTRAVENOUS
  Filled 2021-06-15: qty 4

## 2021-06-15 MED ORDER — MORPHINE SULFATE (PF) 4 MG/ML IV SOLN
4.0000 mg | Freq: Once | INTRAVENOUS | Status: AC
  Administered 2021-06-15: 4 mg via INTRAVENOUS
  Filled 2021-06-15: qty 1

## 2021-06-15 MED ORDER — ACETAMINOPHEN 325 MG PO TABS: 650.0000 mg | ORAL_TABLET | ORAL | Status: DC | PRN

## 2021-06-15 MED ORDER — NICOTINE 7 MG/24HR TD PT24
7.0000 mg | MEDICATED_PATCH | TRANSDERMAL | Status: DC
  Filled 2021-06-15 (×2): qty 1

## 2021-06-15 MED ORDER — HEPARIN SODIUM (PORCINE) 5000 UNIT/ML IJ SOLN
5000.0000 [IU] | Freq: Three times a day (TID) | INTRAMUSCULAR | Status: DC
  Administered 2021-06-15 – 2021-06-17 (×6): 5000 [IU] via SUBCUTANEOUS
  Filled 2021-06-15 (×6): qty 1

## 2021-06-15 MED ORDER — CLONIDINE HCL 0.2 MG PO TABS
0.2000 mg | ORAL_TABLET | Freq: Two times a day (BID) | ORAL | Status: DC
  Administered 2021-06-15 – 2021-06-17 (×4): 0.2 mg via ORAL
  Filled 2021-06-15 (×5): qty 1

## 2021-06-15 MED ORDER — PANTOPRAZOLE SODIUM 40 MG PO TBEC
40.0000 mg | DELAYED_RELEASE_TABLET | Freq: Every day | ORAL | Status: DC
  Administered 2021-06-16 – 2021-06-17 (×2): 40 mg via ORAL
  Filled 2021-06-15 (×3): qty 1

## 2021-06-15 MED ORDER — ASPIRIN 81 MG PO CHEW
324.0000 mg | CHEWABLE_TABLET | Freq: Once | ORAL | Status: AC
  Administered 2021-06-15: 324 mg via ORAL
  Filled 2021-06-15: qty 4

## 2021-06-15 MED ORDER — GABAPENTIN 300 MG PO CAPS
300.0000 mg | ORAL_CAPSULE | Freq: Every day | ORAL | Status: DC
  Administered 2021-06-15 – 2021-06-16 (×2): 300 mg via ORAL
  Filled 2021-06-15 (×2): qty 1

## 2021-06-15 MED ORDER — IOHEXOL 350 MG/ML SOLN
100.0000 mL | Freq: Once | INTRAVENOUS | Status: AC | PRN
  Administered 2021-06-15: 100 mL via INTRAVENOUS

## 2021-06-15 MED ORDER — AMLODIPINE BESYLATE 10 MG PO TABS
10.0000 mg | ORAL_TABLET | Freq: Every day | ORAL | Status: DC
  Administered 2021-06-15 – 2021-06-17 (×3): 10 mg via ORAL
  Filled 2021-06-15: qty 2
  Filled 2021-06-15 (×2): qty 1

## 2021-06-15 MED ORDER — NITROGLYCERIN IN D5W 200-5 MCG/ML-% IV SOLN
0.0000 ug/min | INTRAVENOUS | Status: DC
  Administered 2021-06-15: 50 ug/min via INTRAVENOUS
  Filled 2021-06-15: qty 250

## 2021-06-15 MED ORDER — LABETALOL HCL 200 MG PO TABS
300.0000 mg | ORAL_TABLET | Freq: Two times a day (BID) | ORAL | Status: DC
  Administered 2021-06-15 – 2021-06-17 (×4): 300 mg via ORAL
  Filled 2021-06-15 (×5): qty 1

## 2021-06-15 NOTE — ED Notes (Signed)
Received verbal report from Tara D RN at this time 

## 2021-06-15 NOTE — ED Notes (Signed)
ED TO INPATIENT HANDOFF REPORT  ED Nurse Name and Phone #: Baxter Flattery, RN [phone]  S Name/Age/Gender Richard Patterson 45 y.o. male Room/Bed: 024C/024C  Code Status   Code Status: Full Code  Home/SNF/Other Home Patient oriented to: self, place, time, and situation Is this baseline? Yes   Triage Complete: Triage complete  Chief Complaint Chest pain [R07.9] Precordial pain [R07.2]  Triage Note Pt. Stated, ive had chest pain with left sided numbness started 2 hours ago   Allergies Allergies  Allergen Reactions   Shellfish Allergy Hives and Rash   Shellfish Allergy Hives, Itching, Swelling and Rash    Level of Care/Admitting Diagnosis ED Disposition     ED Disposition  Admit   Condition  --   [place]: [place] [[id number]]  Level of Care: Progressive [102]  Admit to Progressive based on following criteria: CARDIOVASCULAR & THORACIC of moderate stability with acute coronary syndrome symptoms/low risk myocardial infarction/hypertensive urgency/arrhythmias/heart failure potentially compromising stability and stable post cardiovascular intervention patients.  May place patient in observation at [facility] or [facility] if equivalent level of care is available:: No  Covid Evaluation: Asymptomatic Screening Protocol (No Symptoms)  Diagnosis: Precordial pain [786.51.ICD-9-CM]  Admitting Physician: Rex Kras [[id number]]  Attending Physician: Rex Kras [[id number]]  Bed request comments: Progressive; HTN crisis on admission; Hx of Aortic dissection.          B Medical/Surgery History Past Medical History:  Diagnosis Date   Essential hypertension    Hypertension    Past Surgical History:  Procedure Laterality Date   AORTIC VALVE REPAIR N/A 04/22/2020   Procedure: REPAIR OF AORTIC DISSECTION USING HEMASHIELD PLATINUM 30 MM VASCULAR GRAFT.;  Surgeon: Gaye Pollack, MD;  Location: [place] [facility];  Service: Open Heart Surgery;  Laterality: N/A;   Axillary cannulation   CHOLECYSTECTOMY     HAND TENDON SURGERY Right      A IV Location/Drains/Wounds Patient Lines/Drains/Airways Status     Active Line/Drains/Airways     Name Placement date Placement time Site Days   Peripheral IV 06/15/21 20 G 1" Anterior;Distal;Left;Upper Arm 06/15/21  0940  Arm  less than 1   Peripheral IV 06/15/21 20 G 1" Posterior;Right Forearm 06/15/21  0951  Forearm  less than 1   Incision (Closed) 04/23/20 Axilla Right 04/23/20  0038  -- 418   Incision (Closed) 04/23/20 Chest Other (Comment) 04/23/20  0038  -- 418            Intake/Output Last 24 hours  Intake/Output Summary (Last 24 hours) at 06/15/2021 1748 Last data filed at 06/15/2021 1218 Gross per 24 hour  Intake 1066.32 ml  Output --  Net 1066.32 ml    Labs/Imaging Results for orders placed or performed during the hospital encounter of 06/15/21 (from the past 48 hour(s))  Troponin I (High Sensitivity)     Status: Abnormal   Collection Time: 06/15/21  9:06 AM  Result Value Ref Range   Troponin I (High Sensitivity) 31 (H) <18 ng/L    Comment: (NOTE) Elevated high sensitivity troponin I (hsTnI) values and significant  changes across serial measurements may suggest ACS but many other  chronic and acute conditions are known to elevate hsTnI results.  Refer to the "Links" section for chest pain algorithms and additional  guidance. Performed at [facility], [place] [address] [place], [place] [address]   Comprehensive metabolic panel     Status: Abnormal   Collection Time: 06/15/21  9:06 AM  Result Value Ref Range   Sodium 139 135 - 145 mmol/L   Potassium 3.6 3.5 - 5.1 mmol/L   Chloride 106 98 - 111 mmol/L   CO2 24 22 - 32 mmol/L   Glucose, Bld 101 (H) 70 - 99 mg/dL    Comment: Glucose reference range applies only to samples taken after fasting for at least 8 hours.   BUN 13 6 - 20 mg/dL   Creatinine, Ser 0.92 0.61 - 1.24 mg/dL   Calcium 8.8 (L) 8.9 - 10.3 mg/dL   Total  Protein 6.9 6.5 - 8.1 g/dL   Albumin 3.7 3.5 - 5.0 g/dL   AST 39 15 - 41 U/L   ALT 36 0 - 44 U/L   Alkaline Phosphatase 66 38 - 126 U/L   Total Bilirubin 0.9 0.3 - 1.2 mg/dL   GFR, Estimated >60 >60 mL/min    Comment: (NOTE) Calculated using the CKD-EPI Creatinine Equation (2021)    Anion gap 9 5 - 15    Comment: Performed at [facility] [address] [place], [place] [address]  Magnesium     Status: None   Collection Time: 06/15/21  9:06 AM  Result Value Ref Range   Magnesium 2.0 1.7 - 2.4 mg/dL    Comment: Performed at [facility] [address] [place], [place] [address]  Lipase, blood     Status: None   Collection Time: 06/15/21  9:06 AM  Result Value Ref Range   Lipase 25 11 - 51 U/L    Comment: Performed at [facility] [address] [place], [place] [address]  CBC with Differential/Platelet     Status: Abnormal   Collection Time: 06/15/21  9:06 AM  Result Value Ref Range   WBC 9.8 4.0 - 10.5 K/uL   RBC 5.92 (H) 4.22 - 5.81 MIL/uL   Hemoglobin 19.0 (H) 13.0 - 17.0 g/dL   HCT 53.3 (H) 39.0 - 52.0 %   MCV 90.0 80.0 - 100.0 fL   MCH 32.1 26.0 - 34.0 pg   MCHC 35.6 30.0 - 36.0 g/dL   RDW 12.6 11.5 - 15.5 %   Platelets 172 150 - 400 K/uL   nRBC 0.0 0.0 - 0.2 %   Neutrophils Relative % 62 %   Neutro Abs 6.2 1.7 - 7.7 K/uL   Lymphocytes Relative 28 %   Lymphs Abs 2.7 0.7 - 4.0 K/uL   Monocytes Relative 7 %   Monocytes Absolute 0.7 0.1 - 1.0 K/uL   Eosinophils Relative 2 %   Eosinophils Absolute 0.2 0.0 - 0.5 K/uL   Basophils Relative 1 %   Basophils Absolute 0.1 0.0 - 0.1 K/uL   Immature Granulocytes 0 %   Abs Immature Granulocytes 0.03 0.00 - 0.07 K/uL    Comment: Performed at [facility] [address], [place], [place] [address]  Protime-INR     Status: None   Collection Time: 06/15/21  9:06 AM  Result Value Ref Range   Prothrombin Time 13.4 11.4 - 15.2 seconds   INR 1.0 0.8 - 1.2    Comment: (NOTE) INR goal varies based on  device and disease states. Performed at [facility], [place] [address], [place], [place] [address]   Resp Panel by RT-PCR (Flu A&B, Covid) Nasopharyngeal Swab     Status: None   Collection Time: 06/15/21  9:30 AM   Specimen: Nasopharyngeal Swab; Nasopharyngeal(NP) swabs in vial transport medium  Result Value  Ref Range   SARS Coronavirus 2 by RT PCR NEGATIVE NEGATIVE    Comment: (NOTE) SARS-CoV-2 target nucleic acids are NOT DETECTED.  The SARS-CoV-2 RNA is generally detectable in upper respiratory specimens during the acute phase of infection. The lowest concentration of SARS-CoV-2 viral copies this assay can detect is 138 copies/mL. A negative result does not preclude SARS-Cov-2 infection and should not be used as the sole basis for treatment or other patient management decisions. A negative result may occur with  improper specimen collection/handling, submission of specimen other than nasopharyngeal swab, presence of viral mutation(s) within the areas targeted by this assay, and inadequate number of viral copies(<138 copies/mL). A negative result must be combined with clinical observations, patient history, and epidemiological information. The expected result is Negative.  Fact Sheet for Patients:  EntrepreneurPulse.com.au  Fact Sheet for Healthcare Providers:  IncredibleEmployment.be  This test is no t yet approved or cleared by the [place] FDA and  has been authorized for detection and/or diagnosis of SARS-CoV-2 by FDA under an Emergency Use Authorization (EUA). This EUA will remain  in effect (meaning this test can be used) for the duration of the COVID-19 declaration under Section 564(b)(1) of the Act, 21 U.S.C.section 360bbb-3(b)(1), unless the authorization is terminated  or revoked sooner.       Influenza A by PCR NEGATIVE NEGATIVE   Influenza B by PCR NEGATIVE NEGATIVE    Comment: (NOTE) The Xpert Xpress  SARS-CoV-2/FLU/RSV plus assay is intended as an aid in the diagnosis of influenza from Nasopharyngeal swab specimens and should not be used as a sole basis for treatment. Nasal washings and aspirates are unacceptable for Xpert Xpress SARS-CoV-2/FLU/RSV testing.  Fact Sheet for Patients: EntrepreneurPulse.com.au  Fact Sheet for Healthcare Providers: IncredibleEmployment.be  This test is not yet approved or cleared by the [place] FDA and has been authorized for detection and/or diagnosis of SARS-CoV-2 by FDA under an Emergency Use Authorization (EUA). This EUA will remain in effect (meaning this test can be used) for the duration of the COVID-19 declaration under Section 564(b)(1) of the Act, 21 U.S.C. section 360bbb-3(b)(1), unless the authorization is terminated or revoked.  Performed at [facility], [place] [address] [place], [place] [address]   I-Stat Chem 8, ED     Status: Abnormal   Collection Time: 06/15/21  9:40 AM  Result Value Ref Range   Sodium 141 135 - 145 mmol/L   Potassium 3.5 3.5 - 5.1 mmol/L   Chloride 105 98 - 111 mmol/L   BUN 16 6 - 20 mg/dL   Creatinine, Ser 0.80 0.61 - 1.24 mg/dL   Glucose, Bld 96 70 - 99 mg/dL    Comment: Glucose reference range applies only to samples taken after fasting for at least 8 hours.   Calcium, Ion 1.09 (L) 1.15 - 1.40 mmol/L   TCO2 26 22 - 32 mmol/L   Hemoglobin 19.0 (H) 13.0 - 17.0 g/dL   HCT 56.0 (H) 39.0 - 52.0 %  Troponin I (High Sensitivity)     Status: Abnormal   Collection Time: 06/15/21 11:06 AM  Result Value Ref Range   Troponin I (High Sensitivity) 34 (H) <18 ng/L    Comment: (NOTE) Elevated high sensitivity troponin I (hsTnI) values and significant  changes across serial measurements may suggest ACS but many other  chronic and acute conditions are known to elevate hsTnI results.  Refer to the "Links" section for chest pain algorithms and additional   guidance. Performed at [facility], [place] [address] [place], [place] [address]   TSH     Status: None   Collection Time: 06/15/21  1:45 PM  Result Value Ref Range   TSH 3.525 0.350 - 4.500 uIU/mL    Comment: Performed by a 3rd Generation assay with a functional sensitivity of <=0.01 uIU/mL. Performed at [facility], [place] [address], [place], [place] [address]    CT Head Wo Contrast  Result Date: 06/15/2021 CLINICAL DATA:  Neuro deficit, acute, stroke suspected EXAM: CT HEAD WITHOUT CONTRAST TECHNIQUE: Contiguous axial images were obtained from the base of the skull through the vertex without intravenous contrast. COMPARISON:  None. FINDINGS: Brain: No evidence of acute infarction, hemorrhage, hydrocephalus, extra-axial collection or mass lesion/mass effect. Small discrete hypodensity in the inferior right basal ganglia, most likely a benign dilated perivascular space given characteristic location. Vascular: No hyperdense vessel identified. Skull: No acute fracture. Sinuses/Orbits: Mild paranasal sinus mucosal thickening. Unremarkable orbits. Other: No mastoid effusions. IMPRESSION: No evidence of acute intracranial abnormality. Electronically Signed   By: Margaretha Sheffield M.D.   On: 06/15/2021 10:22   DG Chest Portable 1 View  Result Date: 06/15/2021 CLINICAL DATA:  Right side chest pain EXAM: PORTABLE CHEST 1 VIEW COMPARISON:  05/20/2020 FINDINGS: Prior median sternotomy. Heart and mediastinal contours are within normal limits. No focal opacities or effusions. No acute bony abnormality. IMPRESSION: No active disease. Electronically Signed   By: Rolm Baptise M.D.   On: 06/15/2021 09:28   CT Angio Chest/Abd/Pel for Dissection W and/or Wo Contrast  Result Date: 06/15/2021 CLINICAL DATA:  Abdominal pain, chest pain, left-sided numbness EXAM: CT ANGIOGRAPHY CHEST, ABDOMEN AND PELVIS TECHNIQUE: Multidetector CT imaging through the chest, abdomen and pelvis was performed using the  standard protocol during bolus administration of intravenous contrast. Multiplanar reconstructed images and MIPs were obtained and reviewed to evaluate the vascular anatomy. CONTRAST:  128mL OMNIPAQUE IOHEXOL 350 MG/ML SOLN COMPARISON:  09/02/2020 and previous FINDINGS: CTA CHEST FINDINGS Cardiovascular: Heart size normal. No pericardial effusion. Fair contrast opacification of the pulmonary arterial tree; the exam was not optimized for detection of pulmonary emboli. There is good contrast opacification of the thoracic aorta. Previous tube graft ascending aorta repair. Classic 3 vessel brachiocephalic arterial origin anatomy without proximal stenosis. Stable residual dissection flap through the arch and descending thoracic aorta which measures 3.5 cm maximum transverse diameter below the inferior left pulmonary vein (previously 3.3). Mediastinum/Nodes: No mass or adenopathy. Lungs/Pleura: No pleural effusion. No pneumothorax. Lungs are clear. Musculoskeletal: Sternotomy wires. No fracture or worrisome bone lesion Review of the MIP images confirms the above findings. CTA ABDOMEN AND PELVIS FINDINGS VASCULAR Aorta: Dissection continues through the length of the abdominal aorta as before. True and false lumens remain patent. No aneurysm or stenosis. Celiac: Patent, supplied by true lumen SMA: Patent, supplied by true lumen Renals: Single left, supplied by false lumen. Single right, supplied by true lumen. IMA: Patent, dissection flap crosses the origin without evident extension into the vessel. Inflow: Right iliac arterial system supplied by true lumen, patent without dissection or stenosis. On the left, the dissection flap extends into the common femoral artery with patency of true and false lumens. There is 2 cm dilatation of the distal common iliac. Veins: No obvious venous abnormality within the limitations of this arterial phase study. Review of the MIP images confirms the above findings. NON-VASCULAR  Hepatobiliary: No focal liver abnormality is seen. Status post cholecystectomy. No biliary dilatation. Pancreas: Unremarkable. No pancreatic ductal  dilatation or surrounding inflammatory changes. Spleen: Normal in size without focal abnormality. Adrenals/Urinary Tract: Adrenal glands are unremarkable. Kidneys are normal, without renal calculi, focal lesion, or hydronephrosis. Bladder is unremarkable. Stomach/Bowel: Stomach is within normal limits. Appendix appears normal. No evidence of bowel wall thickening, distention, or inflammatory changes. Lymphatic: No abdominal or pelvic adenopathy. Reproductive: Prostate is unremarkable. Other: No ascites.  No free air. Musculoskeletal: Degenerative disc disease L5-S1. Negative for fracture or worrisome bone lesion. Review of the MIP images confirms the above findings. IMPRESSION: 1. No acute findings. 2. Stable appearance of ascending aortic repair with residual dissection through the arch, descending abdominal aortic segments through to the left common femoral artery. 3. 2 cm fusiform dilatation of distal left common iliac artery. Electronically Signed   By: Lucrezia Europe M.D.   On: 06/15/2021 12:07   CT Angio Chest/Abd/Pel for Dissection W and/or Wo Contrast  Result Date: 06/15/2021 CLINICAL DATA:  Aortic dissection, repeat exam EXAM: CT ANGIOGRAPHY CHEST, ABDOMEN AND PELVIS TECHNIQUE: Non-contrast CT of the chest was initially obtained. Multidetector CT imaging through the chest, abdomen and pelvis was performed using the standard protocol during bolus administration of intravenous contrast. Multiplanar reconstructed images and MIPs were obtained and reviewed to evaluate the vascular anatomy. CONTRAST:  175mL OMNIPAQUE IOHEXOL 350 MG/ML SOLN COMPARISON:  06/15/2021, 10:03 a.m., 09/02/2020 FINDINGS: CTA CHEST FINDINGS Cardiovascular: Preferential opacification of the thoracic aorta. 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 The remnant dissection flap arises from the proximal arch, subtly extending into the innominate artery origin (series 5, image 33), without involvement of the remaining arch vessel origins, and extends to the abdominal aorta. Caliber of the tubular ascending thoracic aorta is unchanged at 3.9 x 3.8 cm. The arch is mildly enlarged, measuring up to 3.2 x 2.8 cm, and the descending thoracic aorta is enlarged measuring up to 3.9 x 3.8 cm (series 5, image 69). Cardiomegaly. Left ventricular hypertrophy. No pericardial effusion. Mediastinum/Nodes: No enlarged mediastinal, hilar, or axillary lymph nodes. Thyroid gland, trachea, and esophagus demonstrate no significant findings. Lungs/Pleura: Lungs are clear. No pleural effusion or pneumothorax. Musculoskeletal: No chest wall abnormality. No acute or significant osseous findings. Review of the MIP images confirms the above findings. CTA ABDOMEN AND PELVIS FINDINGS VASCULAR Redemonstrated dissection extending through the abdominal aorta, which is nonaneurysmal, measuring up to 2.9 x 2.9 cm (series 5, image 196). The dissection flap is fenestrated, and the true lumen supplies the celiac axis, superior mesenteric artery, right renal arteries, and inferior mesenteric artery, the false lumen supplying the left renal arteries. There are duplicated right renal arteries, a very small accessory superior pole right renal artery, and likely four left renal arteries, multiple small duplicated superior pole left renal arteries. Dissection flap extends into the left common iliac and external iliac arteries and terminates in the left common femoral artery. The left common iliac artery is mildly enlarged measuring up to 1.8 x 1.8 cm (series 5, image 235). Review of the MIP images confirms the above findings. NON-VASCULAR Hepatobiliary: No focal liver abnormality is seen. Hepatic steatosis. Status post cholecystectomy. No biliary  dilatation. Pancreas: Unremarkable. No pancreatic ductal dilatation or surrounding inflammatory changes. Spleen: Normal in size without significant abnormality. Adrenals/Urinary Tract: Adrenal glands are unremarkable. Kidneys are normal, without renal calculi, solid lesion, or hydronephrosis. Bladder is unremarkable. Stomach/Bowel: Stomach is within normal limits. Appendix appears normal. No evidence of bowel wall thickening, distention, or inflammatory  changes. Lymphatic: No enlarged abdominal or pelvic lymph nodes. Reproductive: No mass or other significant abnormality. Other: No abdominal wall hernia or abnormality. No abdominopelvic ascites. Musculoskeletal: No acute or significant osseous findings. Review of the MIP images confirms the above findings. IMPRESSION: 1. 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 Overall caliber and configuration of the dissected vessel is unchanged in comparison to both prior same day examination and examination dated 09/02/2020. The remnant dissection flap arises from the proximal arch, subtly extending into the innominate artery origin, without involvement of the remaining arch vessel origins, and extends to the abdominal aorta. 2. Caliber of the tubular ascending thoracic aorta post graft repair is unchanged at 3.9 x 3.8 cm. The arch is mildly enlarged, measuring up to 3.2 x 2.8 cm, and the descending thoracic aorta is enlarged measuring up to 3.9 x 3.8 cm, both unchanged. 3. The dissected abdominal aorta is nonaneurysmal, measuring up to 2.9 x 2.9 cm. The abdominal portion of the dissection flap is fenestrated, and the true lumen supplies the celiac axis, superior mesenteric artery, right renal arteries, and inferior mesenteric artery, the false lumen supplying the left renal arteries. 4. Dissection flap extends into the left common iliac and external iliac arteries and terminates in  the left common femoral artery. The left common iliac artery is mildly enlarged measuring up to 1.8 x 1.8 cm. 5. Cardiomegaly and left ventricular hypertrophy. 6. Hepatic steatosis. Electronically Signed   By: Delanna Ahmadi M.D.   On: 06/15/2021 11:54    Pending Labs Unresulted Labs (From admission, onward)     Start     Ordered   06/16/21 0500  Lipid panel  Tomorrow morning,   R        06/15/21 1705   06/15/21 1700  CBC  (heparin)  Once,   R       Comments: Baseline for heparin therapy IF NOT ALREADY DRAWN.  Notify MD if PLT < 100 K.    06/15/21 1705   06/15/21 1700  Creatinine, serum  (heparin)  Once,   R       Comments: Baseline for heparin therapy IF NOT ALREADY DRAWN.    06/15/21 1705   06/15/21 1659  HIV Antibody (routine testing w rflx)  (HIV Antibody (Routine testing w reflex) panel)  Once,   R        06/15/21 1705   06/15/21 1301  Urinalysis, Routine w reflex microscopic Urine, Clean Catch  Once,   STAT        06/15/21 1300   06/15/21 1301  Rapid urine drug screen (hospital performed)  ONCE - STAT,   STAT        06/15/21 1300            Vitals/Pain Today's Vitals   06/15/21 1215 06/15/21 1300 06/15/21 1315 06/15/21 1733  BP: (!) 138/96 (!) 150/103 (!) 147/93 (!) 160/84  Pulse:  62 71   Resp: (!) 22 16 19    Temp:      TempSrc:      SpO2:  97% 96%   Weight:      PainSc:        Isolation Precautions No active isolations  Medications Medications  morphine 4 MG/ML injection 4 mg (has no administration in time range)  acetaminophen (TYLENOL) tablet 650 mg (has no administration in time range)  ondansetron (ZOFRAN) injection 4 mg (has no administration in time  range)  heparin injection 5,000 Units (has no administration in time range)  cloNIDine (CATAPRES) tablet 0.2 mg (has no administration in time range)  amLODipine (NORVASC) tablet 10 mg (10 mg Oral Given 06/15/21 1733)  lisinopril (ZESTRIL) tablet 20 mg (20 mg Oral Given 06/15/21 1733)  labetalol  (NORMODYNE) tablet 300 mg (has no administration in time range)  pantoprazole (PROTONIX) EC tablet 40 mg (has no administration in time range)  gabapentin (NEURONTIN) capsule 300 mg (has no administration in time range)  aspirin chewable tablet 324 mg (324 mg Oral Given 06/15/21 0940)  nitroGLYCERIN (NITROGLYN) 2 % ointment 1 inch (1 inch Topical Given 06/15/21 0941)  labetalol (NORMODYNE) injection 20 mg (20 mg Intravenous Given 06/15/21 0941)  morphine 4 MG/ML injection 4 mg (4 mg Intravenous Given 06/15/21 0941)  iohexol (OMNIPAQUE) 350 MG/ML injection 100 mL (100 mLs Intravenous Contrast Given 06/15/21 1010)  lactated ringers bolus 1,000 mL (0 mLs Intravenous Stopped 06/15/21 1218)  iohexol (OMNIPAQUE) 350 MG/ML injection 100 mL (100 mLs Intravenous Contrast Given 06/15/21 1127)    Mobility walks Low fall risk   Focused Assessments Cardiac Assessment Handoff:  Cardiac Rhythm: Normal sinus rhythm Lab Results  Component Value Date   TROPONINI <0.03 03/04/2018   No results found for: DDIMER Does the Patient currently have chest pain? No    R Recommendations: See Admitting Provider Note  Report given to:   Additional Notes:

## 2021-06-15 NOTE — ED Notes (Signed)
Called floor reference pt and was advised not sure who the nurse is getting the pt and someone would call me back

## 2021-06-15 NOTE — H&P (Signed)
HISTORY AND PHYSICAL  Patient ID: Richard Patterson MRN: [id number] DOB/AGE: [birth year] 45 y.o.  Admit date: 06/15/2021 Attending physician: Rex Kras, [facility] Primary Physician:  [place], Betty G, MD  Chief complaint: Chest Pain.   HPI:  Richard Patterson is a 45 y.o. male who presents with a chief complaint of " chest pain." His past medical history and cardiovascular risk factors include: Hypertension, history of type A dissection, history of polycythemia, obesity due to excess calories, cigarette smoking.  Patient has been experiencing chest pain for the last 1 to 2 weeks, lasting 5 to 10 minutes in duration, intermittently present, woke up this morning around 5:55 PM with right-sided anterior precordial chest pain, sharp like sensation, 8 out of 10 in intensity, similar to what he experienced last year prior to being diagnosed with type a dissection who presents to the hospital for further evaluation and management.  Patient states that the pain is not pleuritic or positional in nature.  The discomfort is not brought on by effort related activities and does not resolve with rest.  The discomfort is self-limited  Patient states that when he does take his medications his home SBP ranges between 140-150 mmHg.  However he has not taken any medications for the last 2 days.  Since his surgical repair of type a dissection last year he has not followed up with cardiology or cardiothoracic surgery.  Patient states that he has been refilling his prescriptions via mail order until now.  On presentation his blood pressure was 220/138 mmHg, underwent CT dissection protocol which did not illustrate new findings.  Results of the CTA dissection protocol results were conveyed to Dr. Roxan Hockey as per Dr. Godfrey Pick, ED provider and no additional cardiothoracic interventions recommended at this time.  Patient was placed on esmolol and nitro drip and  as needed morphine until his blood pressures have  improved.  Cardiology has been asked to admit the patient for further evaluation of chest pain and to rule out ACS.  High sensitive troponin is elevated but essentially flat 31 followed by 34.  Chest pain on presentation 8 out of 10 and with better blood pressure management 1 out of 10.  ALLERGIES: Allergies  Allergen Reactions   Shellfish Allergy Hives and Rash   Shellfish Allergy Hives, Itching, Swelling and Rash    PAST MEDICAL HISTORY: Past Medical History:  Diagnosis Date   Essential hypertension    Hypertension   History of type a dissection status postrepair  PAST SURGICAL HISTORY: Past Surgical History:  Procedure Laterality Date   AORTIC VALVE REPAIR N/A 04/22/2020   Procedure: REPAIR OF AORTIC DISSECTION USING HEMASHIELD PLATINUM 30 MM VASCULAR GRAFT.;  Surgeon: Gaye Pollack, MD;  Location: [facility];  Service: Open Heart Surgery;  Laterality: N/A;  Axillary cannulation   CHOLECYSTECTOMY     HAND TENDON SURGERY Right     FAMILY HISTORY: The patient family history includes Cancer in his maternal grandfather and maternal grandmother; Hypertension in his father and mother.  No family history of premature coronary artery disease.   SOCIAL HISTORY:  Freight forwarder at [employer].  Smokes 1 pack/day.  Consumes beer once a week.  Denies illicit drugs.  MEDICATIONS: Current Outpatient Medications  Medication Instructions   amLODipine (NORVASC) 5 mg, Oral, Daily   aspirin 325 mg, Oral, Daily   cloNIDine (CATAPRES) 0.3 mg, Oral, 2 times daily   gabapentin (NEURONTIN) 300 mg, Oral, Daily at bedtime   HYDROcodone-acetaminophen (NORCO/VICODIN) 5-325 MG tablet 1 tablet,  Oral, Every 6 hours PRN   labetalol (NORMODYNE) 300 mg, Oral, 2 times daily   lisinopril (ZESTRIL) 20 mg, Oral, Daily   pantoprazole (PROTONIX) 40 mg, Oral, Daily   REVIEW OF SYSTEMS: Review of Systems  Constitutional: Negative for chills and fever.  HENT:  Negative for hoarse voice and nosebleeds.   Eyes:   Negative for discharge, double vision and pain.  Cardiovascular:  Positive for chest pain (improving). Negative for claudication, dyspnea on exertion, leg swelling, near-syncope, orthopnea, palpitations, paroxysmal nocturnal dyspnea and syncope.  Respiratory:  Negative for hemoptysis and shortness of breath.   Musculoskeletal:  Negative for muscle cramps and myalgias.  Gastrointestinal:  Negative for abdominal pain, constipation, diarrhea, hematemesis, hematochezia, melena, nausea and vomiting.  Neurological:  Negative for dizziness and light-headedness.  All other systems reviewed and are negative.  PHYSICAL EXAM: Vitals with BMI 06/15/2021 06/15/2021 06/15/2021  Height - - -  Weight - - -  BMI - - -  Systolic 361 443 154  Diastolic 008 676 98  Pulse 62 70 73    Intake/Output Summary (Last 24 hours) at 06/15/2021 1321 Last data filed at 06/15/2021 1218 Gross per 24 hour  Intake 1066.32 ml  Output --  Net 1066.32 ml    Net IO Since Admission: 1,066.32 mL [06/15/21 1321] CONSTITUTIONAL: Appears older than stated age, hemodynamically stable, well-developed and well-nourished. No acute distress.  SKIN: Skin is warm and dry. No rash noted. No cyanosis. No pallor. No jaundice HEAD: Normocephalic and atraumatic.  EYES: No scleral icterus MOUTH/THROAT: Moist oral membranes.  NECK: No JVD present. No thyromegaly noted. No carotid bruits  LYMPHATIC: No visible cervical adenopathy.  CHEST Normal respiratory effort. No intercostal retractions prior sternotomy site is well-healed. LUNGS: Clear to auscultation bilaterally.  No stridor. No wheezes. No rales.  CARDIOVASCULAR: Regular rate and rhythm, positive P9-J0, soft holosystolic murmur, no gallops or rubs.   ABDOMINAL: Obese, soft, nontender, nondistended, positive bowel sounds in all 4 quadrants, no apparent ascites.  EXTREMITIES: No peripheral edema.  2+ bilateral radial pulses.  2+ right femoral, popliteal, DP and PT pulses.  1+ left  femoral, DP PT pulses. HEMATOLOGIC: No significant bruising NEUROLOGIC: Oriented to person, place, and time. Nonfocal. Normal muscle tone.  PSYCHIATRIC: Normal mood and affect. Normal behavior. Cooperative  RADIOLOGY: CT Head Wo Contrast  Result Date: 06/15/2021 CLINICAL DATA:  Neuro deficit, acute, stroke suspected EXAM: CT HEAD WITHOUT CONTRAST TECHNIQUE: Contiguous axial images were obtained from the base of the skull through the vertex without intravenous contrast. COMPARISON:  None. FINDINGS: Brain: No evidence of acute infarction, hemorrhage, hydrocephalus, extra-axial collection or mass lesion/mass effect. Small discrete hypodensity in the inferior right basal ganglia, most likely a benign dilated perivascular space given characteristic location. Vascular: No hyperdense vessel identified. Skull: No acute fracture. Sinuses/Orbits: Mild paranasal sinus mucosal thickening. Unremarkable orbits. Other: No mastoid effusions. IMPRESSION: No evidence of acute intracranial abnormality. Electronically Signed   By: Margaretha Sheffield M.D.   On: 06/15/2021 10:22   DG Chest Portable 1 View  Result Date: 06/15/2021 CLINICAL DATA:  Right side chest pain EXAM: PORTABLE CHEST 1 VIEW COMPARISON:  05/20/2020 FINDINGS: Prior median sternotomy. Heart and mediastinal contours are within normal limits. No focal opacities or effusions. No acute bony abnormality. IMPRESSION: No active disease. Electronically Signed   By: Rolm Baptise M.D.   On: 06/15/2021 09:28   CT Angio Chest/Abd/Pel for Dissection W and/or Wo Contrast  Result Date: 06/15/2021 CLINICAL DATA:  Abdominal pain, chest pain, left-sided  numbness EXAM: CT ANGIOGRAPHY CHEST, ABDOMEN AND PELVIS TECHNIQUE: Multidetector CT imaging through the chest, abdomen and pelvis was performed using the standard protocol during bolus administration of intravenous contrast. Multiplanar reconstructed images and MIPs were obtained and reviewed to evaluate the vascular  anatomy. CONTRAST:  157mL OMNIPAQUE IOHEXOL 350 MG/ML SOLN COMPARISON:  09/02/2020 and previous FINDINGS: CTA CHEST FINDINGS Cardiovascular: Heart size normal. No pericardial effusion. Fair contrast opacification of the pulmonary arterial tree; the exam was not optimized for detection of pulmonary emboli. There is good contrast opacification of the thoracic aorta. Previous tube graft ascending aorta repair. Classic 3 vessel brachiocephalic arterial origin anatomy without proximal stenosis. Stable residual dissection flap through the arch and descending thoracic aorta which measures 3.5 cm maximum transverse diameter below the inferior left pulmonary vein (previously 3.3). Mediastinum/Nodes: No mass or adenopathy. Lungs/Pleura: No pleural effusion. No pneumothorax. Lungs are clear. Musculoskeletal: Sternotomy wires. No fracture or worrisome bone lesion Review of the MIP images confirms the above findings. CTA ABDOMEN AND PELVIS FINDINGS VASCULAR Aorta: Dissection continues through the length of the abdominal aorta as before. True and false lumens remain patent. No aneurysm or stenosis. Celiac: Patent, supplied by true lumen SMA: Patent, supplied by true lumen Renals: Single left, supplied by false lumen. Single right, supplied by true lumen. IMA: Patent, dissection flap crosses the origin without evident extension into the vessel. Inflow: Right iliac arterial system supplied by true lumen, patent without dissection or stenosis. On the left, the dissection flap extends into the common femoral artery with patency of true and false lumens. There is 2 cm dilatation of the distal common iliac. Veins: No obvious venous abnormality within the limitations of this arterial phase study. Review of the MIP images confirms the above findings. NON-VASCULAR Hepatobiliary: No focal liver abnormality is seen. Status post cholecystectomy. No biliary dilatation. Pancreas: Unremarkable. No pancreatic ductal dilatation or surrounding  inflammatory changes. Spleen: Normal in size without focal abnormality. Adrenals/Urinary Tract: Adrenal glands are unremarkable. Kidneys are normal, without renal calculi, focal lesion, or hydronephrosis. Bladder is unremarkable. Stomach/Bowel: Stomach is within normal limits. Appendix appears normal. No evidence of bowel wall thickening, distention, or inflammatory changes. Lymphatic: No abdominal or pelvic adenopathy. Reproductive: Prostate is unremarkable. Other: No ascites.  No free air. Musculoskeletal: Degenerative disc disease L5-S1. Negative for fracture or worrisome bone lesion. Review of the MIP images confirms the above findings. IMPRESSION: 1. No acute findings. 2. Stable appearance of ascending aortic repair with residual dissection through the arch, descending abdominal aortic segments through to the left common femoral artery. 3. 2 cm fusiform dilatation of distal left common iliac artery. Electronically Signed   By: Lucrezia Europe M.D.   On: 06/15/2021 12:07   CT Angio Chest/Abd/Pel for Dissection W and/or Wo Contrast  Result Date: 06/15/2021 CLINICAL DATA:  Aortic dissection, repeat exam EXAM: CT ANGIOGRAPHY CHEST, ABDOMEN AND PELVIS TECHNIQUE: Non-contrast CT of the chest was initially obtained. Multidetector CT imaging through the chest, abdomen and pelvis was performed using the standard protocol during bolus administration of intravenous contrast. Multiplanar reconstructed images and MIPs were obtained and reviewed to evaluate the vascular anatomy. CONTRAST:  19mL OMNIPAQUE IOHEXOL 350 MG/ML SOLN COMPARISON:  06/15/2021, 10:03 a.m., 09/02/2020 FINDINGS: CTA CHEST FINDINGS Cardiovascular: Preferential opacification of the thoracic aorta. 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 The remnant dissection flap arises from the proximal arch, subtly extending into the innominate artery origin (series 5,  image 33), without involvement of the remaining arch vessel origins, and extends to the abdominal aorta. Caliber of the tubular ascending thoracic aorta is unchanged at 3.9 x 3.8 cm. The arch is mildly enlarged, measuring up to 3.2 x 2.8 cm, and the descending thoracic aorta is enlarged measuring up to 3.9 x 3.8 cm (series 5, image 69). Cardiomegaly. Left ventricular hypertrophy. No pericardial effusion. Mediastinum/Nodes: No enlarged mediastinal, hilar, or axillary lymph nodes. Thyroid gland, trachea, and esophagus demonstrate no significant findings. Lungs/Pleura: Lungs are clear. No pleural effusion or pneumothorax. Musculoskeletal: No chest wall abnormality. No acute or significant osseous findings. Review of the MIP images confirms the above findings. CTA ABDOMEN AND PELVIS FINDINGS VASCULAR Redemonstrated dissection extending through the abdominal aorta, which is nonaneurysmal, measuring up to 2.9 x 2.9 cm (series 5, image 196). The dissection flap is fenestrated, and the true lumen supplies the celiac axis, superior mesenteric artery, right renal arteries, and inferior mesenteric artery, the false lumen supplying the left renal arteries. There are duplicated right renal arteries, a very small accessory superior pole right renal artery, and likely four left renal arteries, multiple small duplicated superior pole left renal arteries. Dissection flap extends into the left common iliac and external iliac arteries and terminates in the left common femoral artery. The left common iliac artery is mildly enlarged measuring up to 1.8 x 1.8 cm (series 5, image 235). Review of the MIP images confirms the above findings. NON-VASCULAR Hepatobiliary: No focal liver abnormality is seen. Hepatic steatosis. Status post cholecystectomy. No biliary dilatation. Pancreas: Unremarkable. No pancreatic ductal dilatation or surrounding inflammatory changes. Spleen: Normal in size without significant abnormality. Adrenals/Urinary  Tract: Adrenal glands are unremarkable. Kidneys are normal, without renal calculi, solid lesion, or hydronephrosis. Bladder is unremarkable. Stomach/Bowel: Stomach is within normal limits. Appendix appears normal. No evidence of bowel wall thickening, distention, or inflammatory changes. Lymphatic: No enlarged abdominal or pelvic lymph nodes. Reproductive: No mass or other significant abnormality. Other: No abdominal wall hernia or abnormality. No abdominopelvic ascites. Musculoskeletal: No acute or significant osseous findings. Review of the MIP images confirms the above findings. IMPRESSION: 1. 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 Overall caliber and configuration of the dissected vessel is unchanged in comparison to both prior same day examination and examination dated 09/02/2020. The remnant dissection flap arises from the proximal arch, subtly extending into the innominate artery origin, without involvement of the remaining arch vessel origins, and extends to the abdominal aorta. 2. Caliber of the tubular ascending thoracic aorta post graft repair is unchanged at 3.9 x 3.8 cm. The arch is mildly enlarged, measuring up to 3.2 x 2.8 cm, and the descending thoracic aorta is enlarged measuring up to 3.9 x 3.8 cm, both unchanged. 3. The dissected abdominal aorta is nonaneurysmal, measuring up to 2.9 x 2.9 cm. The abdominal portion of the dissection flap is fenestrated, and the true lumen supplies the celiac axis, superior mesenteric artery, right renal arteries, and inferior mesenteric artery, the false lumen supplying the left renal arteries. 4. Dissection flap extends into the left common iliac and external iliac arteries and terminates in the left common femoral artery. The left common iliac artery is mildly enlarged measuring up to 1.8 x 1.8 cm. 5. Cardiomegaly and left ventricular hypertrophy. 6. Hepatic  steatosis. Electronically Signed   By: Delanna Ahmadi M.D.   On: 06/15/2021 11:54    LABORATORY DATA: Lab Results  Component Value Date   WBC 9.8 06/15/2021   HGB 19.0 (H) 06/15/2021   HCT 56.0 (H) 06/15/2021   MCV 90.0 06/15/2021   PLT 172 06/15/2021    Recent Labs  Lab 06/15/21 0906 06/15/21 0940  NA 139 141  K 3.6 3.5  CL 106 105  CO2 24  --   BUN 13 16  CREATININE 0.92 0.80  CALCIUM 8.8*  --   PROT 6.9  --   BILITOT 0.9  --   ALKPHOS 66  --   ALT 36  --   AST 39  --   GLUCOSE 101* 96    Lipid Panel  Lab Results  Component Value Date   CHOL 143 01/21/2019   HDL 44.20 01/21/2019   LDLCALC 66 01/21/2019   TRIG 162.0 (H) 01/21/2019   CHOLHDL 3 01/21/2019    BNP (last 3 results) No results for input(s): BNP in the last 8760 hours.  HEMOGLOBIN A1C Lab Results  Component Value Date   HGBA1C 5.2 04/25/2020   MPG 102.54 04/25/2020    Cardiac Panel (last 3 results) No results for input(s): CKTOTAL, CKMB, RELINDX in the last 8760 hours.  Invalid input(s): TROPONINHS  No results found for: CKTOTAL, CKMB, CKMBINDEX   TSH No results for input(s): TSH in the last 8760 hours.    CARDIAC DATABASE: 04/23/2020 with Dr. Gilford Raid: Median Sternotomy Extracorporeal circulation 3.   Replacement of the ascending aorta (hemi-arch) using a 30 mm Hemashield graft under deep hypothermic circulatory arrest  EKG: 06/15/2021: NSR, 77 bpm, right axis, biatrial enlargement, occasional PVCs, ST-T changes high lateral and lateral leads due to possible underlying ischemia versus repolarization due to LVH.  Similar ST-T changes on prior EKG dated 09/03/2020.  06/15/2021: NSR, 74 bpm, right axis, biatrial enlargement, prolonged QT, ST-T changes suggestive of possible ischemia versus repolarization abnormality.  Echocardiogram: Pending  RADIOLOGY: CT chest abdomen pelvis dissection protocol 04/22/2020: 1. Stanford type A aortic dissection originating in the ascending aorta  just above the sinus of Valsalva. Recommend immediate vascular surgery consultation. 2. Dissection flap extends through the entire the aorta terminating in the LEFT common iliac artery. 3. Dissection flap enters the superior mesenteric artery with PARTIAL OCCLUSION. 4. No significant aneurysmal dilatation of the aorta. 5. Renal arteries opacify. 6. Celiac trunk and IMA are patent. 7. Rounded lesion at the tail the pancreas either represents a primary pancreatic lesion large splenule. Recommend non emergent contrast MRI of the abdomen for further evaluation.  CTA chest abdomen pelvis dissection protocol 06/15/2021: 1. 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 Overall caliber and configuration of the dissected vessel is unchanged in comparison to both prior same day examination and examination dated 09/02/2020. The remnant dissection flap arises from the proximal arch, subtly extending into the innominate artery origin, without involvement of the remaining arch vessel origins, and extends to the abdominal aorta. 2. Caliber of the tubular ascending thoracic aorta post graft repair is unchanged at 3.9 x 3.8 cm. The arch is mildly enlarged, measuring up to 3.2 x 2.8 cm, and the descending thoracic aorta is enlarged measuring up to 3.9 x 3.8 cm, both unchanged. 3. The dissected abdominal aorta is nonaneurysmal, measuring up to 2.9 x 2.9 cm. The abdominal portion of the dissection flap is fenestrated, and the true lumen supplies the celiac axis, superior mesenteric artery, right renal arteries, and inferior mesenteric artery, the false lumen supplying the  left renal arteries. 4. Dissection flap extends into the left common iliac and external iliac arteries and terminates in the left common femoral artery. The left common iliac artery is mildly enlarged measuring up to 1.8 x 1.8 cm. 5.  Cardiomegaly and left ventricular hypertrophy. 6. Hepatic steatosis.    IMPRESSION & RECOMMENDATIONS: Richard Patterson is a 45 y.o. Caucasian male whose past medical history and cardiovascular risk factors include: Hypertension, history of type A dissection, history of polycythemia, obesity due to excess calories, cigarette smoking..  Precordial pain /elevated troponin secondary to type II MI (supply demand) /abnormal EKG: Not suggestive of ACS per symptoms. High sensitive troponin is slightly elevated but flat not suggestive of ACS -most likely secondary to supply demand ischemia in the setting of hypertensive emergency on presentation EKG shows a sinus rhythm with diffuse ST-T changes similar compared to prior EKG.  However, cannot rule out ischemia versus repolarization abnormality at this time. Echocardiogram will be ordered to evaluate for structural heart disease and left ventricular systolic function. We will schedule for a pharmacological stress test ordered in the setting of baseline ST-T changes, LVH.  Will evaluate for reversible ischemia.    Hypertensive emergency: Present on admission.  SBP 220/138 with symptoms of chest pain, elevated troponins Improving. Placed on IV nitro and esmolol drip while in ED. Will resume home medications. I suspect that his hypertensive crisis is due to not being on antihypertensive medications for the last several days including clonidine. Restart home medications with reducing the dose of clonidine to 0.2 mg twice daily, increasing amlodipine to 10 mg p.o. daily Will uptitrate antihypertensive medications.  Patient is recommended to have a goal systolic blood pressure of 120 mmHg if Patterson to tolerate given his history of type A aortic dissection status postrepair Check UDS Check BNP  History of type a aortic dissection status post surgical repair 03/2020: Given his presentation being very similar to when he presented with a dissection CTA  dissection protocol was performed during this hospitalization.   ED provider reviewed the case with cardiothoracic surgeon Dr. Roxan Hockey, according to Dr. Doren Custard no additional recommendations provided at this time. Will await recommendations if any.  History of polycythemia: Monitor for now recommend outpatient follow-up  Cigarette smoking: Currently smokes 1pack/week.  Educated on the importance of complete smoking cessation.  For now will prescribe nicotine patch.  Educated on the importance of medication compliance post discharge from the hospitalization and outpatient follow-up with primary care, cardiology, and cardiothoracic surgery once a year given his complex history.  Consultants: None   Code Status: Full.  In case of emergency patient would like his ex [place] to make decisions on his behalf.  Patient does not have any documentation to provide.   Family Communication: None present at bedside   Disposition Plan: Home   Time spent: 87 mins  Patient's questions and concerns were addressed to his satisfaction. He voices understanding of the instructions provided during this encounter.   This note was created using a voice recognition software as a result there may be grammatical errors inadvertently enclosed that do not reflect the nature of this encounter. Every attempt is made to correct such errors.  Mechele Claude [facility]  Pager: [phone] Office: [phone] 06/15/2021, 1:21 PM

## 2021-06-15 NOTE — ED Provider Notes (Signed)
MSE was initiated and I personally evaluated the patient and placed orders (if any) at  9:11 AM on June 15, 2021.  The patient appears stable so that the remainder of the MSE may be completed by another provider.  Patient with history of aortic dissection 1 year ago, presents for similar symptoms.  Onset of symptoms today was 2 hours ago.  At the time, he was at rest, laying in bed.  Patient reports right-sided chest pain, 8/10 in severity.  He also reports paresthesias in his left arm.  No focal neurologic deficits.  Left arm blood pressure elevated in the range of 220/138.  Similar blood pressure in right arm.  EKG with concern of anterior ST segment elevation.  Will consult cardiology and initiate work-up, including dissection study.  Blood pressure medications ordered.  Patient to have priority with being bedded.   Godfrey Pick, MD 06/15/21 [phone]

## 2021-06-15 NOTE — ED Triage Notes (Signed)
Pt. Stated, ive had chest pain with left sided numbness started 2 hours ago

## 2021-06-15 NOTE — ED Provider Notes (Signed)
[facility] EMERGENCY DEPARTMENT Provider Note   CSN: [id number] Arrival date & time: 06/15/21  0846     History Chief Complaint  Patient presents with   Chest Pain   Numbness    Richard Patterson is a 45 y.o. male.   Chest Pain Associated symptoms: no abdominal pain, no back pain, no cough, no dizziness, no fatigue, no fever, no headache, no nausea, no numbness, no palpitations, no shortness of breath, no vomiting and no weakness   Patient presents for 8/10 severity right-sided chest pain.  Onset was 2 hours prior to arrival.  At the time, he was at rest, laying in bed.  Pain does not radiate but patient does describe left arm paresthesias.  Symptoms are reminiscent to him of a episode of aortic dissection in September of last year.  Patient reports that his blood pressure is typically well controlled.  He does not take a daily aspirin.  He has had ongoing left leg pain and lower back pain which is chronic for him.  He denies any current shortness of breath or nausea. HPI: A 45 year old patient with a history of hypertension and obesity presents for evaluation of chest pain. Initial onset of pain was less than one hour ago. The patient's chest pain is described as heaviness/pressure/tightness, is not worse with exertion and is relieved by nitroglycerin. The patient's chest pain is not middle- or left-sided, is not well-localized, is not sharp and does not radiate to the arms/jaw/neck. The patient does not complain of nausea and denies diaphoresis. The patient has smoked in the past 90 days. The patient has no history of stroke, has no history of peripheral artery disease, denies any history of treated diabetes, has no relevant family history of coronary artery disease (first degree relative at less than age 70) and has no history of hypercholesterolemia.   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    Past Surgical History:  Procedure Laterality Date   AORTIC VALVE REPAIR N/A 04/22/2020   Procedure: REPAIR OF AORTIC DISSECTION USING HEMASHIELD PLATINUM 30 MM VASCULAR GRAFT.;  Surgeon: Gaye Pollack, MD;  Location: [facility] OR;  Service: Open Heart Surgery;  Laterality: N/A;  Axillary cannulation   CHOLECYSTECTOMY     HAND TENDON SURGERY Right        Family History  Problem Relation Age of Onset   Hypertension Mother    Hypertension Father    Cancer Maternal Grandmother    Cancer Maternal Grandfather    Heart attack Neg Hx    Heart failure Neg Hx     Social History   Tobacco Use   Smoking status: Former   Smokeless tobacco: Never  Substance Use Topics   Alcohol use: Yes   Drug use: Never    Home Medications Prior to Admission medications   Medication Sig Start Date End Date Taking? Authorizing Provider  amLODipine (NORVASC) 5 MG tablet Take 1 tablet (5 mg total) by  mouth daily. 01/19/21  Yes [place]ique, Betty G, MD  cloNIDine (CATAPRES) 0.3 MG tablet Take 1 tablet (0.3 mg total) by mouth 2 (two) times daily. 01/19/21  Yes [place], Betty G, MD  gabapentin (NEURONTIN) 300 MG capsule Take 1 capsule (300 mg total) by mouth at bedtime. 01/19/21  Yes [place], Betty G, MD  labetalol (NORMODYNE) 300 MG tablet Take 1 tablet (300 mg total) by mouth 2 (two) times daily. 01/19/21  Yes [place], Betty G, MD  lisinopril (ZESTRIL) 20 MG tablet Take 1 tablet (20 mg total) by mouth daily. 01/19/21  Yes [place], Betty G, MD  aspirin 325 MG EC tablet Take 1 tablet (325 mg  total) by mouth daily. Patient not taking: Reported on 06/15/2021 05/20/20   Gaye Pollack, MD  HYDROcodone-acetaminophen (NORCO/VICODIN) 5-325 MG tablet Take 1 tablet by mouth every 6 (six) hours as needed for moderate pain or severe pain. Patient not taking: Reported on 06/15/2021 10/15/19   Vanessa Kick, MD  pantoprazole (PROTONIX) 40 MG tablet Take 1 tablet (40 mg total) by mouth daily. Patient not taking: Reported on 06/15/2021 05/20/20   Gaye Pollack, MD  diphenhydrAMINE (BENADRYL) 25 mg capsule Take 1 capsule (25 mg total) by mouth every 6 (six) hours as needed. With  reglan for headache. 06/01/16 04/22/20  Charlesetta Shanks, MD    Allergies    Shellfish allergy and Shellfish allergy  Review of Systems   Review of Systems  Constitutional:  Negative for chills, fatigue and fever.  HENT:  Negative for congestion, ear pain and sore throat.   Eyes:  Negative for pain and visual disturbance.  Respiratory:  Negative for cough, chest tightness and shortness of breath.   Cardiovascular:  Positive for chest pain. Negative for palpitations and leg swelling.  Gastrointestinal:  Negative for abdominal pain, nausea and vomiting.  Genitourinary:  Negative for dysuria, flank pain and hematuria.  Musculoskeletal:  Negative for arthralgias, back pain, gait problem, myalgias and neck pain.  Skin:  Negative for color change and rash.  Neurological:  Negative for dizziness, seizures, syncope, weakness, numbness and headaches.       Left arm paresthesia  Psychiatric/Behavioral:  Negative for confusion and decreased concentration.   All other systems reviewed and are negative.  Physical Exam Updated Vital Signs BP (!) 160/84   Pulse 71   Temp 97.6 F (36.4 C) (Oral)   Resp 19   Wt 127 kg   SpO2 96%   BMI 36.94 kg/m   Physical Exam Vitals and nursing note reviewed.  Constitutional:      General: He is not in acute distress.    Appearance: He is well-developed. He is not ill-appearing,  toxic-appearing or diaphoretic.  HENT:     Head: Normocephalic and atraumatic.  Eyes:     Extraocular Movements: Extraocular movements intact.     Conjunctiva/sclera: Conjunctivae normal.  Cardiovascular:     Rate and Rhythm: Normal rate and regular rhythm.     Heart sounds: No murmur heard. Pulmonary:     Effort: Pulmonary effort is normal. No tachypnea or respiratory distress.     Breath sounds: Normal breath sounds. No decreased breath sounds or rales.  Chest:     Chest wall: No tenderness.  Abdominal:     Palpations: Abdomen is soft.     Tenderness: There is no abdominal tenderness.  Musculoskeletal:        General: No swelling.     Cervical back: Normal range of motion and neck supple.  Skin:    General: Skin is warm and dry.     Capillary Refill: Capillary refill takes less than 2 seconds.     Coloration: Skin is not cyanotic or pale.  Neurological:     Mental Status: He is alert and oriented to person, place, and time.     Cranial Nerves: Cranial nerves 2-12 are intact. No cranial nerve deficit, dysarthria or facial asymmetry.     Sensory: Sensation is intact. No sensory deficit.     Motor: Motor function is intact. No weakness, abnormal muscle tone or pronator drift.     Coordination: Coordination is intact.  Psychiatric:        Mood and Affect: Mood normal.    ED Results / Procedures / Treatments   Labs (all labs ordered are listed, but only abnormal results are displayed) Labs Reviewed  COMPREHENSIVE METABOLIC PANEL - Abnormal; Notable for the following components:      Result Value   Glucose, Bld 101 (*)    Calcium 8.8 (*)    All other components within normal limits  CBC WITH DIFFERENTIAL/PLATELET - Abnormal; Notable for the following components:   RBC 5.92 (*)    Hemoglobin 19.0 (*)    HCT 53.3 (*)    All other components within normal limits  URINALYSIS, ROUTINE W REFLEX MICROSCOPIC - Abnormal; Notable for the following components:   Specific Gravity,  Urine >1.046 (*)    All other components within normal limits  RAPID URINE DRUG SCREEN, HOSP PERFORMED - Abnormal; Notable for the following components:   Opiates POSITIVE (*)    All other components within normal limits  I-STAT CHEM 8, ED - Abnormal; Notable for the following components:   Calcium, Ion 1.09 (*)    Hemoglobin 19.0 (*)    HCT 56.0 (*)    All other components within normal limits  TROPONIN I (HIGH SENSITIVITY) - Abnormal; Notable for the following components:   Troponin I (High Sensitivity) 31 (*)    All other components within normal limits  TROPONIN I (HIGH SENSITIVITY) - Abnormal; Notable for the following components:   Troponin I (High Sensitivity) 34 (*)    All other components within normal limits  RESP PANEL BY RT-PCR (FLU A&B, COVID) ARPGX2  MAGNESIUM  LIPASE, BLOOD  PROTIME-INR  TSH  HIV ANTIBODY (ROUTINE TESTING W REFLEX)  CBC  CREATININE, SERUM  LIPID PANEL  BRAIN NATRIURETIC PEPTIDE  BASIC METABOLIC PANEL  MAGNESIUM    EKG EKG Interpretation  Date/Time:  Tuesday June 15 2021 08:41:49 EST Ventricular Rate:  77 PR Interval:  166 QRS Duration: 94 QT Interval:  436 QTC Calculation: 493 R Axis:   151 Text Interpretation: Sinus rhythm with occasional Premature ventricular complexes Biatrial enlargement Right axis deviation ST & T wave abnormality, consider inferolateral ischemia Prolonged QT Abnormal ECG Confirmed by Godfrey Pick ([id number]) on 06/15/2021 9:34:41 AM  Radiology CT Head Wo Contrast  Result Date: 06/15/2021 CLINICAL DATA:  Neuro deficit, acute, stroke suspected EXAM: CT HEAD WITHOUT CONTRAST TECHNIQUE: Contiguous axial images were obtained from the base of the skull through the vertex without intravenous contrast. COMPARISON:  None. FINDINGS: Brain: No evidence of acute infarction, hemorrhage, hydrocephalus, extra-axial collection or mass lesion/mass effect. Small discrete hypodensity in the inferior right basal ganglia, most likely a  benign dilated perivascular space given characteristic location. Vascular: No hyperdense vessel identified. Skull: No acute fracture. Sinuses/Orbits: Mild paranasal sinus mucosal thickening. Unremarkable orbits. Other: No mastoid effusions. IMPRESSION: No evidence of acute intracranial abnormality.  Electronically Signed   By: Margaretha Sheffield M.D.   On: 06/15/2021 10:22   DG Chest Portable 1 View  Result Date: 06/15/2021 CLINICAL DATA:  Right side chest pain EXAM: PORTABLE CHEST 1 VIEW COMPARISON:  05/20/2020 FINDINGS: Prior median sternotomy. Heart and mediastinal contours are within normal limits. No focal opacities or effusions. No acute bony abnormality. IMPRESSION: No active disease. Electronically Signed   By: Rolm Baptise M.D.   On: 06/15/2021 09:28   CT Angio Chest/Abd/Pel for Dissection W and/or Wo Contrast  Result Date: 06/15/2021 CLINICAL DATA:  Abdominal pain, chest pain, left-sided numbness EXAM: CT ANGIOGRAPHY CHEST, ABDOMEN AND PELVIS TECHNIQUE: Multidetector CT imaging through the chest, abdomen and pelvis was performed using the standard protocol during bolus administration of intravenous contrast. Multiplanar reconstructed images and MIPs were obtained and reviewed to evaluate the vascular anatomy. CONTRAST:  172mL OMNIPAQUE IOHEXOL 350 MG/ML SOLN COMPARISON:  09/02/2020 and previous FINDINGS: CTA CHEST FINDINGS Cardiovascular: Heart size normal. No pericardial effusion. Fair contrast opacification of the pulmonary arterial tree; the exam was not optimized for detection of pulmonary emboli. There is good contrast opacification of the thoracic aorta. Previous tube graft ascending aorta repair. Classic 3 vessel brachiocephalic arterial origin anatomy without proximal stenosis. Stable residual dissection flap through the arch and descending thoracic aorta which measures 3.5 cm maximum transverse diameter below the inferior left pulmonary vein (previously 3.3). Mediastinum/Nodes: No mass or  adenopathy. Lungs/Pleura: No pleural effusion. No pneumothorax. Lungs are clear. Musculoskeletal: Sternotomy wires. No fracture or worrisome bone lesion Review of the MIP images confirms the above findings. CTA ABDOMEN AND PELVIS FINDINGS VASCULAR Aorta: Dissection continues through the length of the abdominal aorta as before. True and false lumens remain patent. No aneurysm or stenosis. Celiac: Patent, supplied by true lumen SMA: Patent, supplied by true lumen Renals: Single left, supplied by false lumen. Single right, supplied by true lumen. IMA: Patent, dissection flap crosses the origin without evident extension into the vessel. Inflow: Right iliac arterial system supplied by true lumen, patent without dissection or stenosis. On the left, the dissection flap extends into the common femoral artery with patency of true and false lumens. There is 2 cm dilatation of the distal common iliac. Veins: No obvious venous abnormality within the limitations of this arterial phase study. Review of the MIP images confirms the above findings. NON-VASCULAR Hepatobiliary: No focal liver abnormality is seen. Status post cholecystectomy. No biliary dilatation. Pancreas: Unremarkable. No pancreatic ductal dilatation or surrounding inflammatory changes. Spleen: Normal in size without focal abnormality. Adrenals/Urinary Tract: Adrenal glands are unremarkable. Kidneys are normal, without renal calculi, focal lesion, or hydronephrosis. Bladder is unremarkable. Stomach/Bowel: Stomach is within normal limits. Appendix appears normal. No evidence of bowel wall thickening, distention, or inflammatory changes. Lymphatic: No abdominal or pelvic adenopathy. Reproductive: Prostate is unremarkable. Other: No ascites.  No free air. Musculoskeletal: Degenerative disc disease L5-S1. Negative for fracture or worrisome bone lesion. Review of the MIP images confirms the above findings. IMPRESSION: 1. No acute findings. 2. Stable appearance of  ascending aortic repair with residual dissection through the arch, descending abdominal aortic segments through to the left common femoral artery. 3. 2 cm fusiform dilatation of distal left common iliac artery. Electronically Signed   By: Lucrezia Europe M.D.   On: 06/15/2021 12:07   CT Angio Chest/Abd/Pel for Dissection W and/or Wo Contrast  Result Date: 06/15/2021 CLINICAL DATA:  Aortic dissection, repeat exam EXAM: CT ANGIOGRAPHY CHEST, ABDOMEN AND PELVIS TECHNIQUE: Non-contrast CT of the chest  was initially obtained. Multidetector CT imaging through the chest, abdomen and pelvis was performed using the standard protocol during bolus administration of intravenous contrast. Multiplanar reconstructed images and MIPs were obtained and reviewed to evaluate the vascular anatomy. CONTRAST:  19mL OMNIPAQUE IOHEXOL 350 MG/ML SOLN COMPARISON:  06/15/2021, 10:03 a.m., 09/02/2020 FINDINGS: CTA CHEST FINDINGS Cardiovascular: Preferential opacification of the thoracic aorta. 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 The remnant dissection flap arises from the proximal arch, subtly extending into the innominate artery origin (series 5, image 33), without involvement of the remaining arch vessel origins, and extends to the abdominal aorta. Caliber of the tubular ascending thoracic aorta is unchanged at 3.9 x 3.8 cm. The arch is mildly enlarged, measuring up to 3.2 x 2.8 cm, and the descending thoracic aorta is enlarged measuring up to 3.9 x 3.8 cm (series 5, image 69). Cardiomegaly. Left ventricular hypertrophy. No pericardial effusion. Mediastinum/Nodes: No enlarged mediastinal, hilar, or axillary lymph nodes. Thyroid gland, trachea, and esophagus demonstrate no significant findings. Lungs/Pleura: Lungs are clear. No pleural effusion or pneumothorax. Musculoskeletal: No chest wall abnormality. No acute or significant osseous  findings. Review of the MIP images confirms the above findings. CTA ABDOMEN AND PELVIS FINDINGS VASCULAR Redemonstrated dissection extending through the abdominal aorta, which is nonaneurysmal, measuring up to 2.9 x 2.9 cm (series 5, image 196). The dissection flap is fenestrated, and the true lumen supplies the celiac axis, superior mesenteric artery, right renal arteries, and inferior mesenteric artery, the false lumen supplying the left renal arteries. There are duplicated right renal arteries, a very small accessory superior pole right renal artery, and likely four left renal arteries, multiple small duplicated superior pole left renal arteries. Dissection flap extends into the left common iliac and external iliac arteries and terminates in the left common femoral artery. The left common iliac artery is mildly enlarged measuring up to 1.8 x 1.8 cm (series 5, image 235). Review of the MIP images confirms the above findings. NON-VASCULAR Hepatobiliary: No focal liver abnormality is seen. Hepatic steatosis. Status post cholecystectomy. No biliary dilatation. Pancreas: Unremarkable. No pancreatic ductal dilatation or surrounding inflammatory changes. Spleen: Normal in size without significant abnormality. Adrenals/Urinary Tract: Adrenal glands are unremarkable. Kidneys are normal, without renal calculi, solid lesion, or hydronephrosis. Bladder is unremarkable. Stomach/Bowel: Stomach is within normal limits. Appendix appears normal. No evidence of bowel wall thickening, distention, or inflammatory changes. Lymphatic: No enlarged abdominal or pelvic lymph nodes. Reproductive: No mass or other significant abnormality. Other: No abdominal wall hernia or abnormality. No abdominopelvic ascites. Musculoskeletal: No acute or significant osseous findings. Review of the MIP images confirms the above findings. IMPRESSION: 1. Redemonstrated type A dissection of the thoracic and abdominal aorta status post Bentall type graft  repair of the tubular ascending aorta. Overall caliber and configuration of the dissected vessel is unchanged in comparison to both prior same day examination and examination dated 09/02/2020. The remnant dissection flap arises from the proximal arch, subtly extending into the innominate artery origin, without involvement of the remaining arch vessel origins, and extends to the abdominal aorta. 2. Caliber of the tubular ascending thoracic aorta post graft repair is unchanged at 3.9 x 3.8 cm. The arch is mildly enlarged, measuring up to 3.2 x 2.8 cm, and the descending thoracic aorta is enlarged measuring up to 3.9 x 3.8 cm, both unchanged. 3. The dissected abdominal aorta is nonaneurysmal, measuring up to 2.9 x 2.9 cm.  The abdominal portion of the dissection flap is fenestrated, and the true lumen supplies the celiac axis, superior mesenteric artery, right renal arteries, and inferior mesenteric artery, the false lumen supplying the left renal arteries. 4. Dissection flap extends into the left common iliac and external iliac arteries and terminates in the left common femoral artery. The left common iliac artery is mildly enlarged measuring up to 1.8 x 1.8 cm. 5. Cardiomegaly and left ventricular hypertrophy. 6. Hepatic steatosis. Electronically Signed   By: Delanna Ahmadi M.D.   On: 06/15/2021 11:54    Procedures Procedures   Medications Ordered in ED Medications  morphine 4 MG/ML injection 4 mg (has no administration in time range)  acetaminophen (TYLENOL) tablet 650 mg (has no administration in time range)  ondansetron (ZOFRAN) injection 4 mg (has no administration in time range)  heparin injection 5,000 Units (5,000 Units Subcutaneous Given 06/15/21 1836)  cloNIDine (CATAPRES) tablet 0.2 mg (has no administration in time range)  amLODipine (NORVASC) tablet 10 mg (10 mg Oral Given 06/15/21 1733)  lisinopril (ZESTRIL) tablet 20 mg (20 mg Oral Given 06/15/21 1733)  labetalol (NORMODYNE) tablet 300 mg  (has no administration in time range)  pantoprazole (PROTONIX) EC tablet 40 mg (has no administration in time range)  gabapentin (NEURONTIN) capsule 300 mg (has no administration in time range)  nicotine (NICODERM CQ - dosed in mg/24 hr) patch 7 mg (has no administration in time range)  aspirin chewable tablet 324 mg (324 mg Oral Given 06/15/21 0940)  nitroGLYCERIN (NITROGLYN) 2 % ointment 1 inch (1 inch Topical Given 06/15/21 0941)  labetalol (NORMODYNE) injection 20 mg (20 mg Intravenous Given 06/15/21 0941)  morphine 4 MG/ML injection 4 mg (4 mg Intravenous Given 06/15/21 0941)  iohexol (OMNIPAQUE) 350 MG/ML injection 100 mL (100 mLs Intravenous Contrast Given 06/15/21 1010)  lactated ringers bolus 1,000 mL (0 mLs Intravenous Stopped 06/15/21 1218)  iohexol (OMNIPAQUE) 350 MG/ML injection 100 mL (100 mLs Intravenous Contrast Given 06/15/21 1127)    ED Course  I have reviewed the triage vital signs and the nursing notes.  Pertinent labs & imaging results that were available during my care of the patient were reviewed by me and considered in my medical decision making (see chart for details).    MDM Rules/Calculators/A&P HEAR Score: 5                       CRITICAL CARE Performed by: Godfrey Pick   Total critical care time: 35 minutes  Critical care time was exclusive of separately billable procedures and treating other patients.  Critical care was necessary to treat or prevent imminent or life-threatening deterioration.  Critical care was time spent personally by me on the following activities: development of treatment plan with patient and/or surrogate as well as nursing, discussions with consultants, evaluation of patient's response to treatment, examination of patient, obtaining history from patient or surrogate, ordering and performing treatments and interventions, ordering and review of laboratory studies, ordering and review of radiographic studies, pulse oximetry and  re-evaluation of patient's condition.    Patient with history of aortic dissection in September of last year, presenting for acute onset of right-sided chest pain and left arm paresthesias.  Blood pressure on arrival was 220/138.  This was checked on his left arm.  Right arm blood pressure measurement was similar.  Patient currently in endorsing 8/10 severity chest pain.  EKG shows lateral T wave abnormalities, which is chronic, based on previous EKGs.  There  is also concern for ST segment elevation in leads V2/V3.  I did discuss EKG findings with cardiologist on-call.  Cardiology recommendations to proceed with work-up.  No STEMI activation at this time.  Orders for diagnostic work-up, including CTA dissection study placed.  Morphine was given for analgesia.  Patient was also given 324 of ASA as well as nitroglycerin paste and labetalol for acute management of elevated blood pressure.  On reassessment, patient's blood pressure in the range of 170 SBP.  Nitroglycerin GTT ordered.  Patient was given priority for CT scan.  CT scan showed decreased opacification of ascending aorta, likely secondary to contrast bolus timing.  For descending aorta, patient had nonopacification of anterior aspect of dissection flap.  Cardiothoracic surgery was consulted.  I did speak with cardiothoracic surgeon on-call, Dr. Roxan Hockey, who requested a repeat CTA with earlier contrast timing.  This was ordered.  Patient now complaining of abdominal pain.  Empiric treatment for aortic dissection continued.  Patient was ordered esmolol gtt.  As needed morphine ordered for continued analgesia.  Repeat CTA was performed.  Results of CTA studies showed no acute findings.  Nitroglycerin and esmolol gtt. were discontinued.  On reassessment, patient endorses improved chest pain, currently rated at 1/10 in severity.  Based on his symptoms, EKG findings, and risk factors, patient is a heart score of 5.  Additionally, troponins were elevated at  31 --> 34.  Cardiology was consulted and will admit the patient.  I also reached out to Dr. Roxan Hockey again who stated that he will give clearance from a cardiothoracic surgery standpoint.  Patient was admitted to cardiology for further management.  Final Clinical Impression(s) / ED Diagnoses Final diagnoses:  Chest pain, unspecified type    Rx / DC Orders ED Discharge Orders     None        Godfrey Pick, MD 06/15/21 1843

## 2021-06-16 ENCOUNTER — Telehealth: Payer: Self-pay | Admitting: Cardiology

## 2021-06-16 ENCOUNTER — Observation Stay (HOSPITAL_COMMUNITY): Payer: Self-pay

## 2021-06-16 ENCOUNTER — Encounter (HOSPITAL_COMMUNITY)
Admission: EM | Disposition: A | Discharge status: Home / Self Care | Payer: Self-pay | Source: Home / Self Care | Attending: Emergency Medicine

## 2021-06-16 DIAGNOSIS — D45 Polycythemia vera: Secondary | ICD-10-CM

## 2021-06-16 DIAGNOSIS — I21A1 Myocardial infarction type 2: Secondary | ICD-10-CM

## 2021-06-16 DIAGNOSIS — R9439 Abnormal result of other cardiovascular function study: Secondary | ICD-10-CM

## 2021-06-16 DIAGNOSIS — Z8679 Personal history of other diseases of the circulatory system: Secondary | ICD-10-CM

## 2021-06-16 DIAGNOSIS — F17210 Nicotine dependence, cigarettes, uncomplicated: Secondary | ICD-10-CM

## 2021-06-16 HISTORY — PX: LEFT HEART CATH AND CORONARY ANGIOGRAPHY: CATH118249

## 2021-06-16 LAB — BASIC METABOLIC PANEL
Anion gap: 12 (ref 5–15)
BUN: 11 mg/dL (ref 6–20)
CO2: 21 mmol/L — ABNORMAL LOW (ref 22–32)
Calcium: 8.5 mg/dL — ABNORMAL LOW (ref 8.9–10.3)
Chloride: 105 mmol/L (ref 98–111)
Creatinine, Ser: 0.92 mg/dL (ref 0.61–1.24)
GFR, Estimated: >60 mL/min (ref >60)
Glucose, Bld: 89 mg/dL (ref 70–99)
Potassium: 3.4 mmol/L — ABNORMAL LOW (ref 3.5–5.1)
Sodium: 138 mmol/L (ref 135–145)

## 2021-06-16 LAB — ECHOCARDIOGRAM COMPLETE
Area-P 1/2: 3.65 cm2
Calc EF: 42.6 %
Height: 74 in
S' Lateral: 3.6 cm
Single Plane A2C EF: 45.3 %
Single Plane A4C EF: 44.3 %
Weight: 4723.2 oz

## 2021-06-16 LAB — MAGNESIUM: Magnesium: 1.9 mg/dL (ref 1.7–2.4)

## 2021-06-16 LAB — LIPID PANEL
Cholesterol: 150 mg/dL (ref 0–200)
HDL: 41 mg/dL (ref >40)
LDL Cholesterol: 81 mg/dL (ref 0–99)
Total CHOL/HDL Ratio: 3.7 RATIO
Triglycerides: 138 mg/dL (ref <150)
VLDL: 28 mg/dL (ref 0–40)

## 2021-06-16 SURGERY — LEFT HEART CATH AND CORONARY ANGIOGRAPHY
Anesthesia: LOCAL

## 2021-06-16 MED ORDER — FENTANYL CITRATE (PF) 100 MCG/2ML IJ SOLN
INTRAMUSCULAR | Status: AC
  Filled 2021-06-16: qty 2

## 2021-06-16 MED ORDER — LISINOPRIL 20 MG PO TABS
20.0000 mg | ORAL_TABLET | Freq: Once | ORAL | Status: AC
  Administered 2021-06-16: 20 mg via ORAL
  Filled 2021-06-16: qty 1

## 2021-06-16 MED ORDER — MIDAZOLAM HCL 2 MG/2ML IJ SOLN
INTRAMUSCULAR | Status: DC | PRN
  Administered 2021-06-16: 2 mg via INTRAVENOUS

## 2021-06-16 MED ORDER — TECHNETIUM TC 99M TETROFOSMIN IV KIT
30.8000 | PACK | Freq: Once | INTRAVENOUS | Status: AC | PRN
  Administered 2021-06-16: 30.8 via INTRAVENOUS

## 2021-06-16 MED ORDER — SODIUM CHLORIDE 0.9% FLUSH: 3.0000 mL | INTRAVENOUS | Status: DC | PRN

## 2021-06-16 MED ORDER — HYDRALAZINE HCL 20 MG/ML IJ SOLN: 10.0000 mg | INTRAMUSCULAR | Status: AC | PRN

## 2021-06-16 MED ORDER — TECHNETIUM TC 99M TETROFOSMIN IV KIT
11.0000 | PACK | Freq: Once | INTRAVENOUS | Status: AC | PRN
  Administered 2021-06-16: 11 via INTRAVENOUS

## 2021-06-16 MED ORDER — SODIUM CHLORIDE 0.9% FLUSH
3.0000 mL | Freq: Two times a day (BID) | INTRAVENOUS | Status: DC
  Administered 2021-06-16: 3 mL via INTRAVENOUS

## 2021-06-16 MED ORDER — REGADENOSON 0.4 MG/5ML IV SOLN
0.4000 mg | Freq: Once | INTRAVENOUS | Status: AC
  Filled 2021-06-16: qty 5

## 2021-06-16 MED ORDER — SODIUM CHLORIDE 0.9% FLUSH: 3.0000 mL | Freq: Two times a day (BID) | INTRAVENOUS | Status: DC

## 2021-06-16 MED ORDER — REGADENOSON 0.4 MG/5ML IV SOLN
INTRAVENOUS | Status: AC
  Administered 2021-06-16: 0.4 mg via INTRAVENOUS
  Filled 2021-06-16: qty 5

## 2021-06-16 MED ORDER — LABETALOL HCL 5 MG/ML IV SOLN: 10.0000 mg | INTRAVENOUS | Status: AC | PRN

## 2021-06-16 MED ORDER — HEPARIN SODIUM (PORCINE) 1000 UNIT/ML IJ SOLN
INTRAMUSCULAR | Status: DC | PRN
  Administered 2021-06-16: 5000 [IU] via INTRAVENOUS

## 2021-06-16 MED ORDER — HEPARIN (PORCINE) IN NACL 1000-0.9 UT/500ML-% IV SOLN
INTRAVENOUS | Status: AC
  Filled 2021-06-16: qty 500

## 2021-06-16 MED ORDER — IOHEXOL 350 MG/ML SOLN
INTRAVENOUS | Status: DC | PRN
  Administered 2021-06-16: 55 mL

## 2021-06-16 MED ORDER — PERFLUTREN LIPID MICROSPHERE
1.0000 mL | INTRAVENOUS | Status: AC | PRN
Start: 2021-06-16 — End: 2021-06-16
  Administered 2021-06-16: 2 mL via INTRAVENOUS
  Filled 2021-06-16: qty 10

## 2021-06-16 MED ORDER — LISINOPRIL 40 MG PO TABS
40.0000 mg | ORAL_TABLET | Freq: Every day | ORAL | Status: DC
  Administered 2021-06-17: 40 mg via ORAL
  Filled 2021-06-16: qty 1

## 2021-06-16 MED ORDER — HEPARIN (PORCINE) IN NACL 1000-0.9 UT/500ML-% IV SOLN
INTRAVENOUS | Status: DC | PRN
  Administered 2021-06-16 (×2): 500 mL

## 2021-06-16 MED ORDER — ASPIRIN 81 MG PO CHEW
81.0000 mg | CHEWABLE_TABLET | ORAL | Status: AC
  Administered 2021-06-16: 81 mg via ORAL
  Filled 2021-06-16: qty 1

## 2021-06-16 MED ORDER — VERAPAMIL HCL 2.5 MG/ML IV SOLN
INTRAVENOUS | Status: AC
  Filled 2021-06-16: qty 2

## 2021-06-16 MED ORDER — FENTANYL CITRATE (PF) 100 MCG/2ML IJ SOLN
INTRAMUSCULAR | Status: DC | PRN
  Administered 2021-06-16 (×2): 50 ug via INTRAVENOUS

## 2021-06-16 MED ORDER — LIDOCAINE HCL (PF) 1 % IJ SOLN
INTRAMUSCULAR | Status: DC | PRN
  Administered 2021-06-16: 2 mL

## 2021-06-16 MED ORDER — LIDOCAINE HCL (PF) 1 % IJ SOLN
INTRAMUSCULAR | Status: AC
  Filled 2021-06-16: qty 30

## 2021-06-16 MED ORDER — HYDROCHLOROTHIAZIDE 25 MG PO TABS
25.0000 mg | ORAL_TABLET | Freq: Every day | ORAL | Status: DC
  Administered 2021-06-16 – 2021-06-17 (×2): 25 mg via ORAL
  Filled 2021-06-16 (×2): qty 1

## 2021-06-16 MED ORDER — SODIUM CHLORIDE 0.9 % IV SOLN: 250.0000 mL | INTRAVENOUS | Status: DC | PRN

## 2021-06-16 MED ORDER — SODIUM CHLORIDE 0.9 % IV SOLN: INTRAVENOUS | Status: DC

## 2021-06-16 MED ORDER — HEPARIN SODIUM (PORCINE) 1000 UNIT/ML IJ SOLN
INTRAMUSCULAR | Status: AC
  Filled 2021-06-16: qty 10

## 2021-06-16 MED ORDER — VERAPAMIL HCL 2.5 MG/ML IV SOLN
INTRAVENOUS | Status: DC | PRN
  Administered 2021-06-16: 7 mL via INTRA_ARTERIAL

## 2021-06-16 MED ORDER — MIDAZOLAM HCL 2 MG/2ML IJ SOLN
INTRAMUSCULAR | Status: AC
  Filled 2021-06-16: qty 2

## 2021-06-16 SURGICAL SUPPLY — 10 items
CATH OPTITORQUE TIG 4.0 5F (CATHETERS) ×2 IMPLANT
DEVICE RAD COMP TR BAND LRG (VASCULAR PRODUCTS) ×2 IMPLANT
GLIDESHEATH SLEND A-KIT 6F 22G (SHEATH) ×2 IMPLANT
GUIDEWIRE INQWIRE 1.5J.035X260 (WIRE) ×1 IMPLANT
INQWIRE 1.5J .035X260CM (WIRE) ×2
KIT HEART LEFT (KITS) ×2 IMPLANT
PACK CARDIAC CATHETERIZATION (CUSTOM PROCEDURE TRAY) ×2 IMPLANT
SHEATH PROBE COVER 6X72 (BAG) ×2 IMPLANT
TRANSDUCER W/STOPCOCK (MISCELLANEOUS) ×2 IMPLANT
TUBING CIL FLEX 10 FLL-RA (TUBING) ×2 IMPLANT

## 2021-06-16 NOTE — Progress Notes (Signed)
Returned from [employer] at this time.

## 2021-06-16 NOTE — Progress Notes (Signed)
Progress Note  Patient Name: Richard Patterson Date of Encounter: 06/16/2021  Attending physician: Rex Kras, DO Primary care provider: [place], Betty G, MD  Subjective: Richard Patterson is a 45 y.o. male who was seen and examined in stress lab prior to his study. Chest pain has improved since admission. Denies shortness of breath, orthopnea, paroxysmal nocturnal dyspnea or lower extremity swelling. Case discussed and reviewed with his nurse.  Objective: Vital Signs in the last 24 hours: Temp:  [97.7 F (36.5 C)-98.3 F (36.8 C)] 98.3 F (36.8 C) (11/23 0743) Pulse Rate:  [62-79] 64 (11/23 0743) Resp:  [15-29] 18 (11/23 0743) BP: (128-160)/(68-120) 146/68 (11/23 0743) SpO2:  [91 %-100 %] 98 % (11/23 0743) Weight:  [127 kg-133.9 kg] 133.9 kg (11/23 0100)  Intake/Output:  Intake/Output Summary (Last 24 hours) at 06/16/2021 0933 Last data filed at 06/16/2021 0408 Gross per 24 hour  Intake 1306.32 ml  Output 900 ml  Net 406.32 ml    Net IO Since Admission: 406.32 mL [06/16/21 0933]  Weights:  Filed Weights   06/15/21 1000 06/16/21 0100  Weight: 127 kg 133.9 kg    Telemetry: Personally reviewed.   Physical examination: PHYSICAL EXAM: Vitals with BMI 06/16/2021 06/16/2021 06/16/2021  Height - - 6\' 2"   Weight - - 295 lbs 3 oz  BMI - - 59.56  Systolic 387 564 -  Diastolic 68 70 -  Pulse 64 62 -    CONSTITUTIONAL: Appears older than stated age, hemodynamically stable. No acute distress.  SKIN: Skin is warm and dry. No rash noted. No cyanosis. No pallor. No jaundice HEAD: Normocephalic and atraumatic.  EYES: No scleral icterus MOUTH/THROAT: Moist oral membranes.  NECK: No JVD present. No thyromegaly noted. No carotid bruits  LYMPHATIC: No visible cervical adenopathy.  CHEST Normal respiratory effort. No intercostal retractions.  Prior sternotomy site is well-healed. LUNGS: Clear to auscultation bilaterally.  No stridor. No wheezes. No rales.   CARDIOVASCULAR:  Regular rate and rhythm, positive P3-I9, soft holosystolic murmur, no rubs or gallops appreciated.  ABDOMINAL: Soft, nontender, nondistended, positive bowel sounds in all 4 quadrants, no apparent ascites.  EXTREMITIES: No pitting edema, warm to touch, 2+ bilateral radial pulses, 2+ right femoral, popliteal, DP and PT pulses, 1+ left femoral/DP and PT pulses. HEMATOLOGIC: No significant bruising NEUROLOGIC: Oriented to person, place, and time. Nonfocal. Normal muscle tone.  PSYCHIATRIC: Normal mood and affect. Normal behavior. Cooperative  Lab Results: Hematology Recent Labs  Lab 06/15/21 0906 06/15/21 0940 06/15/21 2011  WBC 9.8  --  10.8*  RBC 5.92*  --  5.43  HGB 19.0* 19.0* 17.5*  HCT 53.3* 56.0* 49.5  MCV 90.0  --  91.2  MCH 32.1  --  32.2  MCHC 35.6  --  35.4  RDW 12.6  --  13.1  PLT 172  --  168    Chemistry Recent Labs  Lab 06/15/21 0906 06/15/21 0940 06/15/21 2011 06/16/21 0306  NA 139 141  --  138  K 3.6 3.5  --  3.4*  CL 106 105  --  105  CO2 24  --   --  21*  GLUCOSE 101* 96  --  89  BUN 13 16  --  11  CREATININE 0.92 0.80 0.95 0.92  CALCIUM 8.8*  --   --  8.5*  PROT 6.9  --   --   --   ALBUMIN 3.7  --   --   --   AST 39  --   --   --  ALT 36  --   --   --   ALKPHOS 66  --   --   --   BILITOT 0.9  --   --   --   GFRNONAA >60  --  >60 >60  ANIONGAP 9  --   --  12     Cardiac Enzymes: Cardiac Panel (last 3 results) Recent Labs    06/15/21 0906 06/15/21 1106  TROPONINIHS 31* 34*    BNP (last 3 results) Recent Labs    06/15/21 2011  BNP 204.3*    ProBNP (last 3 results) No results for input(s): PROBNP in the last 8760 hours.   DDimer No results for input(s): DDIMER in the last 168 hours.   Hemoglobin A1c:  Lab Results  Component Value Date   HGBA1C 5.2 04/25/2020   MPG 102.54 04/25/2020    TSH  Recent Labs    06/15/21 1345  TSH 3.525    Lipid Panel     Component Value Date/Time   CHOL 150 06/16/2021  0306   TRIG 138 06/16/2021 0306   HDL 41 06/16/2021 0306   CHOLHDL 3.7 06/16/2021 0306   VLDL 28 06/16/2021 0306   LDLCALC 81 06/16/2021 0306    Imaging: CT Head Wo Contrast  Result Date: 06/15/2021 CLINICAL DATA:  Neuro deficit, acute, stroke suspected EXAM: CT HEAD WITHOUT CONTRAST TECHNIQUE: Contiguous axial images were obtained from the base of the skull through the vertex without intravenous contrast. COMPARISON:  None. FINDINGS: Brain: No evidence of acute infarction, hemorrhage, hydrocephalus, extra-axial collection or mass lesion/mass effect. Small discrete hypodensity in the inferior right basal ganglia, most likely a benign dilated perivascular space given characteristic location. Vascular: No hyperdense vessel identified. Skull: No acute fracture. Sinuses/Orbits: Mild paranasal sinus mucosal thickening. Unremarkable orbits. Other: No mastoid effusions. IMPRESSION: No evidence of acute intracranial abnormality. Electronically Signed   By: Margaretha Sheffield M.D.   On: 06/15/2021 10:22   DG Chest Portable 1 View  Result Date: 06/15/2021 CLINICAL DATA:  Right side chest pain EXAM: PORTABLE CHEST 1 VIEW COMPARISON:  05/20/2020 FINDINGS: Prior median sternotomy. Heart and mediastinal contours are within normal limits. No focal opacities or effusions. No acute bony abnormality. IMPRESSION: No active disease. Electronically Signed   By: Rolm Baptise M.D.   On: 06/15/2021 09:28   CT Angio Chest/Abd/Pel for Dissection W and/or Wo Contrast  Result Date: 06/15/2021 CLINICAL DATA:  Abdominal pain, chest pain, left-sided numbness EXAM: CT ANGIOGRAPHY CHEST, ABDOMEN AND PELVIS TECHNIQUE: Multidetector CT imaging through the chest, abdomen and pelvis was performed using the standard protocol during bolus administration of intravenous contrast. Multiplanar reconstructed images and MIPs were obtained and reviewed to evaluate the vascular anatomy. CONTRAST:  166mL OMNIPAQUE IOHEXOL 350 MG/ML SOLN  COMPARISON:  09/02/2020 and previous FINDINGS: CTA CHEST FINDINGS Cardiovascular: Heart size normal. No pericardial effusion. Fair contrast opacification of the pulmonary arterial tree; the exam was not optimized for detection of pulmonary emboli. There is good contrast opacification of the thoracic aorta. Previous tube graft ascending aorta repair. Classic 3 vessel brachiocephalic arterial origin anatomy without proximal stenosis. Stable residual dissection flap through the arch and descending thoracic aorta which measures 3.5 cm maximum transverse diameter below the inferior left pulmonary vein (previously 3.3). Mediastinum/Nodes: No mass or adenopathy. Lungs/Pleura: No pleural effusion. No pneumothorax. Lungs are clear. Musculoskeletal: Sternotomy wires. No fracture or worrisome bone lesion Review of the MIP images confirms the above findings. CTA ABDOMEN AND PELVIS FINDINGS VASCULAR Aorta: Dissection continues through the  length of the abdominal aorta as before. True and false lumens remain patent. No aneurysm or stenosis. Celiac: Patent, supplied by true lumen SMA: Patent, supplied by true lumen Renals: Single left, supplied by false lumen. Single right, supplied by true lumen. IMA: Patent, dissection flap crosses the origin without evident extension into the vessel. Inflow: Right iliac arterial system supplied by true lumen, patent without dissection or stenosis. On the left, the dissection flap extends into the common femoral artery with patency of true and false lumens. There is 2 cm dilatation of the distal common iliac. Veins: No obvious venous abnormality within the limitations of this arterial phase study. Review of the MIP images confirms the above findings. NON-VASCULAR Hepatobiliary: No focal liver abnormality is seen. Status post cholecystectomy. No biliary dilatation. Pancreas: Unremarkable. No pancreatic ductal dilatation or surrounding inflammatory changes. Spleen: Normal in size without focal  abnormality. Adrenals/Urinary Tract: Adrenal glands are unremarkable. Kidneys are normal, without renal calculi, focal lesion, or hydronephrosis. Bladder is unremarkable. Stomach/Bowel: Stomach is within normal limits. Appendix appears normal. No evidence of bowel wall thickening, distention, or inflammatory changes. Lymphatic: No abdominal or pelvic adenopathy. Reproductive: Prostate is unremarkable. Other: No ascites.  No free air. Musculoskeletal: Degenerative disc disease L5-S1. Negative for fracture or worrisome bone lesion. Review of the MIP images confirms the above findings. IMPRESSION: 1. No acute findings. 2. Stable appearance of ascending aortic repair with residual dissection through the arch, descending abdominal aortic segments through to the left common femoral artery. 3. 2 cm fusiform dilatation of distal left common iliac artery. Electronically Signed   By: Lucrezia Europe M.D.   On: 06/15/2021 12:07   CT Angio Chest/Abd/Pel for Dissection W and/or Wo Contrast  Result Date: 06/15/2021 CLINICAL DATA:  Aortic dissection, repeat exam EXAM: CT ANGIOGRAPHY CHEST, ABDOMEN AND PELVIS TECHNIQUE: Non-contrast CT of the chest was initially obtained. Multidetector CT imaging through the chest, abdomen and pelvis was performed using the standard protocol during bolus administration of intravenous contrast. Multiplanar reconstructed images and MIPs were obtained and reviewed to evaluate the vascular anatomy. CONTRAST:  17mL OMNIPAQUE IOHEXOL 350 MG/ML SOLN COMPARISON:  06/15/2021, 10:03 a.m., 09/02/2020 FINDINGS: CTA CHEST FINDINGS Cardiovascular: Preferential opacification of the thoracic aorta. 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 The remnant dissection flap arises from the proximal arch, subtly extending into the innominate artery origin (series 5, image 33), without involvement of the remaining arch vessel  origins, and extends to the abdominal aorta. Caliber of the tubular ascending thoracic aorta is unchanged at 3.9 x 3.8 cm. The arch is mildly enlarged, measuring up to 3.2 x 2.8 cm, and the descending thoracic aorta is enlarged measuring up to 3.9 x 3.8 cm (series 5, image 69). Cardiomegaly. Left ventricular hypertrophy. No pericardial effusion. Mediastinum/Nodes: No enlarged mediastinal, hilar, or axillary lymph nodes. Thyroid gland, trachea, and esophagus demonstrate no significant findings. Lungs/Pleura: Lungs are clear. No pleural effusion or pneumothorax. Musculoskeletal: No chest wall abnormality. No acute or significant osseous findings. Review of the MIP images confirms the above findings. CTA ABDOMEN AND PELVIS FINDINGS VASCULAR Redemonstrated dissection extending through the abdominal aorta, which is nonaneurysmal, measuring up to 2.9 x 2.9 cm (series 5, image 196). The dissection flap is fenestrated, and the true lumen supplies the celiac axis, superior mesenteric artery, right renal arteries, and inferior mesenteric artery, the false lumen supplying the left renal arteries. There are duplicated right renal arteries, a very  small accessory superior pole right renal artery, and likely four left renal arteries, multiple small duplicated superior pole left renal arteries. Dissection flap extends into the left common iliac and external iliac arteries and terminates in the left common femoral artery. The left common iliac artery is mildly enlarged measuring up to 1.8 x 1.8 cm (series 5, image 235). Review of the MIP images confirms the above findings. NON-VASCULAR Hepatobiliary: No focal liver abnormality is seen. Hepatic steatosis. Status post cholecystectomy. No biliary dilatation. Pancreas: Unremarkable. No pancreatic ductal dilatation or surrounding inflammatory changes. Spleen: Normal in size without significant abnormality. Adrenals/Urinary Tract: Adrenal glands are unremarkable. Kidneys are normal,  without renal calculi, solid lesion, or hydronephrosis. Bladder is unremarkable. Stomach/Bowel: Stomach is within normal limits. Appendix appears normal. No evidence of bowel wall thickening, distention, or inflammatory changes. Lymphatic: No enlarged abdominal or pelvic lymph nodes. Reproductive: No mass or other significant abnormality. Other: No abdominal wall hernia or abnormality. No abdominopelvic ascites. Musculoskeletal: No acute or significant osseous findings. Review of the MIP images confirms the above findings. IMPRESSION: 1. 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 Overall caliber and configuration of the dissected vessel is unchanged in comparison to both prior same day examination and examination dated 09/02/2020. The remnant dissection flap arises from the proximal arch, subtly extending into the innominate artery origin, without involvement of the remaining arch vessel origins, and extends to the abdominal aorta. 2. Caliber of the tubular ascending thoracic aorta post graft repair is unchanged at 3.9 x 3.8 cm. The arch is mildly enlarged, measuring up to 3.2 x 2.8 cm, and the descending thoracic aorta is enlarged measuring up to 3.9 x 3.8 cm, both unchanged. 3. The dissected abdominal aorta is nonaneurysmal, measuring up to 2.9 x 2.9 cm. The abdominal portion of the dissection flap is fenestrated, and the true lumen supplies the celiac axis, superior mesenteric artery, right renal arteries, and inferior mesenteric artery, the false lumen supplying the left renal arteries. 4. Dissection flap extends into the left common iliac and external iliac arteries and terminates in the left common femoral artery. The left common iliac artery is mildly enlarged measuring up to 1.8 x 1.8 cm. 5. Cardiomegaly and left ventricular hypertrophy. 6. Hepatic steatosis. Electronically Signed   By: Delanna Ahmadi M.D.   On:  06/15/2021 11:54    CARDIAC DATABASE: EKG: 06/15/2021: NSR, 77 bpm, right axis, biatrial enlargement, occasional PVCs, ST-T changes high lateral and lateral leads due to possible underlying ischemia versus repolarization due to LVH.  Similar ST-T changes on prior EKG dated 09/03/2020.   06/15/2021: NSR, 74 bpm, right axis, biatrial enlargement, prolonged QT, ST-T changes suggestive of possible ischemia versus repolarization abnormality.  Echocardiogram: 06/16/2021: LVEF 50-55%, moderate to severe LVH, indeterminate diastolic function, elevated LAP, trivial AI, estimated RAP 8 mmHg.  Stress test: 06/16/2021: 1. Large inferior wall myocardial perfusion defect shows partial reversibility in the mid and apical regions, suspicious for inferior wall myocardial infarct with peri-infarct ischemia.   2. Inferior and septal wall hypokinesis.   3. Left ventricular ejection fraction 36%   4. Non invasive risk stratification*: High  Heart catheterization: None  RADIOLOGY: CT chest abdomen pelvis dissection protocol 04/22/2020: 1. 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 Recommend immediate vascular surgery consultation. 2. Dissection flap extends through the entire the aorta terminating in the LEFT common iliac artery. 3.  Dissection flap enters the superior mesenteric artery with PARTIAL OCCLUSION. 4. No significant aneurysmal dilatation of the aorta. 5. Renal arteries opacify. 6. Celiac trunk and IMA are patent. 7. Rounded lesion at the tail the pancreas either represents a primary pancreatic lesion large splenule. Recommend non emergent contrast MRI of the abdomen for further evaluation.   CTA chest abdomen pelvis dissection protocol 06/15/2021: 1. 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 Overall caliber and configuration of the  dissected vessel is unchanged in comparison to both prior same day examination and examination dated 09/02/2020. The remnant dissection flap arises from the proximal arch, subtly extending into the innominate artery origin, without involvement of the remaining arch vessel origins, and extends to the abdominal aorta. 2. Caliber of the tubular ascending thoracic aorta post graft repair is unchanged at 3.9 x 3.8 cm. The arch is mildly enlarged, measuring up to 3.2 x 2.8 cm, and the descending thoracic aorta is enlarged measuring up to 3.9 x 3.8 cm, both unchanged. 3. The dissected abdominal aorta is nonaneurysmal, measuring up to 2.9 x 2.9 cm. The abdominal portion of the dissection flap is fenestrated, and the true lumen supplies the celiac axis, superior mesenteric artery, right renal arteries, and inferior mesenteric artery, the false lumen supplying the left renal arteries. 4. Dissection flap extends into the left common iliac and external iliac arteries and terminates in the left common femoral artery. The left common iliac artery is mildly enlarged measuring up to 1.8 x 1.8 cm. 5. Cardiomegaly and left ventricular hypertrophy. 6. Hepatic steatosis.  Scheduled Meds:  amLODipine  10 mg Oral Daily   cloNIDine  0.2 mg Oral BID   gabapentin  300 mg Oral QHS   heparin  5,000 Units Subcutaneous Q8H   labetalol  300 mg Oral BID   lisinopril  20 mg Oral Daily   nicotine  7 mg Transdermal Q24H   pantoprazole  40 mg Oral Daily    Continuous Infusions:   PRN Meds: acetaminophen, morphine injection, ondansetron (ZOFRAN) IV   IMPRESSION & RECOMMENDATIONS: Dakari Kahlel Peake is a 45 y.o. Caucasian male whose past medical history and cardiac risk factors include: Hypertension, history of type A dissection, history of polycythemia, obesity due to excess calories, cigarette smoking.  Precordial pain/elevated troponin due to type II MI (supply demand ischemia)/abnormal EKG & nuclear  stress test: Continues to have precordial discomfort despite improvement in blood pressure, has multiple cardiovascular risk factors, ST-T changes on EKG.  The shared decision was to proceed with nuclear stress test to evaluate for reversible ischemia. Nuclear stress test was reported to be high risk with findings of inferior wall infarct with peri-infarct ischemia, dilated LV, and moderately reduced LVEF with regional wall motion abnormalities. Discussed proceeding with left heart catheterization with possible intervention with the patient during afternoon rounds in the presence of his nurse.  The procedure of left heart catheterization with possible intervention was explained to the patient in detail.  The indication, alternatives, risks and benefits were reviewed.  Complications include but not limited to bleeding, infection, vascular injury, stroke, myocardial infection, arrhythmia, kidney injury requiring short-term or long-term hemodialysis, temporary or permanent pacemaker, radiation-related injury in the case of prolonged fluoroscopy use, emergency cardiac surgery, and death. The patient understands the risks of serious complication is 1-2 in 8182 with diagnostic cardiac cath and 1-2% or less with angioplasty/stenting. The patient voices understanding and provides verbal feedback and  wishes to proceed with coronary angiography with possible PCI.  Hypertensive emergency: Resolved. On admission SBP 220/138 mmHg with chest pain and elevated troponins Treated with IV nitro and esmolol drip Blood pressures have improved. UDS positive for opioids BNP slightly elevated.  Benign essential hypertension: Medications reconciled. Increase lisinopril to 40 mg p.o. daily Add hydrochlorothiazide 25 mg p.o. daily We emphasized the importance of low-salt diet.  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 Roxan Hockey and will  verbally was notified that no additional recommendations needed. No formal consult from cardiothoracic surgery. Spoke to Dr. Kipp Brood during morning rounds no additional recommendations.  History of polycythemia: recommend outpatient follow-up  Cigarette smoking: Educated importance of complete smoking cessation.  Patient is educated extensively with regards to longitudinal follow-up with primary and cardiology once discharged.  I also had the office reach out to him to make a transitional care follow-up visit which she refused at this time.  Patient stated that he will call us back once discharged to proceed forward.  Patient's questions and concerns were addressed to his satisfaction. He voices understanding of the instructions provided during this encounter.   This note was created using a voice recognition software as a result there may be grammatical errors inadvertently enclosed that do not reflect the nature of this encounter. Every attempt is made to correct such errors.  Total time spent: 35 minutes.  Mechele Claude [facility]  Pager: [phone] Office: [phone] 06/16/2021, 9:33 AM

## 2021-06-16 NOTE — Progress Notes (Signed)
Pt to cath lab at this time

## 2021-06-16 NOTE — Consult Note (Signed)
      [address]       [place] [address]             [phone]      06/16/2021 This note is to document acknowledgment of [name]' admission to the hospital for evaluation of chest pain and subsequent findings of the CT angiogram of his chest, abdomen, and pelvis.  Richard Patterson is a 45 year old male known to our service with a history of hypertension, polycythemia, tobacco use, and obesity.  He presented with a type a aortic dissection in September 2021 that was repaired utilizing a Hemashield straight graft and the arch replacement under hypothermic circulatory arrest by Dr. Cyndia Bent on 04/23/2020.  The postoperative course was unremarkable except for his intractable hypertension.  He was eventually weaned from intravenous antihypertensive agents to 3 drug oral regimen providing adequate control.  He was seen in follow-up by Dr. Caffie Pinto on 05/20/2020 and was felt to be progressing appropriately.  His blood pressure was controlled.  Follow-up CTA was recommended and scheduled for the following month but this study was not completed and Richard Patterson did not appear for his appointment.  He presented to the emergency room in February of this year for evaluation of chest pain.  At that time, he was found to be severely hypertensive and his chest pain resolved once his blood pressure was controlled.  Work-up at the time of that admission included CT angiography of the chest abdomen and pelvis which demonstrated stability of the repair and no significant extension of the dissection. Richard Patterson presented to the emergency room here at [facility] yesterday again complaining of chest pain of 4 to 5 days duration with acutely intensifying pain earlier yesterday.  The patient was again noted to be severely hypertensive with a blood pressure of 220/110. He admitted to having not taken his blood pressure medications for several days.  Once his blood pressure was controlled, his chest pain  abated.  The EKG showed some questionable ischemic changes.  High-sensitivity troponin was mildly elevated but serial assays were flat.  CT angiography of the chest /abdomen/ pelvis was repeated and compared with the study from earlier this year.  Again, there was no extension of the dissection and the surgical repair appeared stable. Richard Patterson' presentation and CT findings were discussed with Dr. Roxan Hockey by phone.  The patient was not felt to have any indication for surgical intervention. Continued work-up is in progress today by the cardiology team including a nuclear stress test and echocardiography.  There is currently no indication for further involvement by CT surgery but we remain available to assist if surgical issues arise as the work up proceeds. I will arrange follow up with Dr. Cyndia Bent in 1 year for repeat CTA chest/abd/pelvis.  I attempted to see Richard Patterson 3 times this morning but he has been out of his room or having other studies.    Enid Cutter, [place] [phone]

## 2021-06-16 NOTE — TOC Progression Note (Signed)
Transition of Care [facility]) - Progression Note    Patient Details  Name: Richard Patterson MRN: [id number] Date of Birth: [birth year]  Transition of Care [facility]) CM/SW Contact  Zenon Mayo, RN Phone Number: 06/16/2021, 11:51 AM  Clinical Narrative:    NCM spoke with patient he states he has insurance but left his card at home,and he will take his card to the pharmacy to fill his meds when he is discharged. He states he will call hospital back to give them the insurance information.          Expected Discharge Plan and Services                                                 Social Determinants of Health (SDOH) Interventions    Readmission Risk Interventions No flowsheet data found.

## 2021-06-16 NOTE — Telephone Encounter (Signed)
Called pt today to schedule his TOC appt for 2-3 weeks out. Pt stated he did not have his work schedule yet and he would call back on 06/21/21 to schedule the TOC

## 2021-06-16 NOTE — H&P (View-Only) (Signed)
Progress Note  Patient Name: Richard Patterson Date of Encounter: 06/16/2021  Attending physician: Rex Kras, DO Primary care provider: [place], Betty G, MD  Subjective: Richard Patterson is a 45 y.o. male who was seen and examined in stress lab prior to his study. Chest pain has improved since admission. Denies shortness of breath, orthopnea, paroxysmal nocturnal dyspnea or lower extremity swelling. Case discussed and reviewed with his nurse.  Objective: Vital Signs in the last 24 hours: Temp:  [97.7 F (36.5 C)-98.3 F (36.8 C)] 98.3 F (36.8 C) (11/23 0743) Pulse Rate:  [62-79] 64 (11/23 0743) Resp:  [15-29] 18 (11/23 0743) BP: (128-160)/(68-120) 146/68 (11/23 0743) SpO2:  [91 %-100 %] 98 % (11/23 0743) Weight:  [127 kg-133.9 kg] 133.9 kg (11/23 0100)  Intake/Output:  Intake/Output Summary (Last 24 hours) at 06/16/2021 0933 Last data filed at 06/16/2021 0408 Gross per 24 hour  Intake 1306.32 ml  Output 900 ml  Net 406.32 ml    Net IO Since Admission: 406.32 mL [06/16/21 0933]  Weights:  Filed Weights   06/15/21 1000 06/16/21 0100  Weight: 127 kg 133.9 kg    Telemetry: Personally reviewed.   Physical examination: PHYSICAL EXAM: Vitals with BMI 06/16/2021 06/16/2021 06/16/2021  Height - - 6\' 2"   Weight - - 295 lbs 3 oz  BMI - - 32.95  Systolic 188 416 -  Diastolic 68 70 -  Pulse 64 62 -    CONSTITUTIONAL: Appears older than stated age, hemodynamically stable. No acute distress.  SKIN: Skin is warm and dry. No rash noted. No cyanosis. No pallor. No jaundice HEAD: Normocephalic and atraumatic.  EYES: No scleral icterus MOUTH/THROAT: Moist oral membranes.  NECK: No JVD present. No thyromegaly noted. No carotid bruits  LYMPHATIC: No visible cervical adenopathy.  CHEST Normal respiratory effort. No intercostal retractions.  Prior sternotomy site is well-healed. LUNGS: Clear to auscultation bilaterally.  No stridor. No wheezes. No rales.   CARDIOVASCULAR:  Regular rate and rhythm, positive S0-Y3, soft holosystolic murmur, no rubs or gallops appreciated.  ABDOMINAL: Soft, nontender, nondistended, positive bowel sounds in all 4 quadrants, no apparent ascites.  EXTREMITIES: No pitting edema, warm to touch, 2+ bilateral radial pulses, 2+ right femoral, popliteal, DP and PT pulses, 1+ left femoral/DP and PT pulses. HEMATOLOGIC: No significant bruising NEUROLOGIC: Oriented to person, place, and time. Nonfocal. Normal muscle tone.  PSYCHIATRIC: Normal mood and affect. Normal behavior. Cooperative  Lab Results: Hematology Recent Labs  Lab 06/15/21 0906 06/15/21 0940 06/15/21 2011  WBC 9.8  --  10.8*  RBC 5.92*  --  5.43  HGB 19.0* 19.0* 17.5*  HCT 53.3* 56.0* 49.5  MCV 90.0  --  91.2  MCH 32.1  --  32.2  MCHC 35.6  --  35.4  RDW 12.6  --  13.1  PLT 172  --  168    Chemistry Recent Labs  Lab 06/15/21 0906 06/15/21 0940 06/15/21 2011 06/16/21 0306  NA 139 141  --  138  K 3.6 3.5  --  3.4*  CL 106 105  --  105  CO2 24  --   --  21*  GLUCOSE 101* 96  --  89  BUN 13 16  --  11  CREATININE 0.92 0.80 0.95 0.92  CALCIUM 8.8*  --   --  8.5*  PROT 6.9  --   --   --   ALBUMIN 3.7  --   --   --   AST 39  --   --   --  ALT 36  --   --   --   ALKPHOS 66  --   --   --   BILITOT 0.9  --   --   --   GFRNONAA >60  --  >60 >60  ANIONGAP 9  --   --  12     Cardiac Enzymes: Cardiac Panel (last 3 results) Recent Labs    06/15/21 0906 06/15/21 1106  TROPONINIHS 31* 34*    BNP (last 3 results) Recent Labs    06/15/21 2011  BNP 204.3*    ProBNP (last 3 results) No results for input(s): PROBNP in the last 8760 hours.   DDimer No results for input(s): DDIMER in the last 168 hours.   Hemoglobin A1c:  Lab Results  Component Value Date   HGBA1C 5.2 04/25/2020   MPG 102.54 04/25/2020    TSH  Recent Labs    06/15/21 1345  TSH 3.525    Lipid Panel     Component Value Date/Time   CHOL 150 06/16/2021  0306   TRIG 138 06/16/2021 0306   HDL 41 06/16/2021 0306   CHOLHDL 3.7 06/16/2021 0306   VLDL 28 06/16/2021 0306   LDLCALC 81 06/16/2021 0306    Imaging: CT Head Wo Contrast  Result Date: 06/15/2021 CLINICAL DATA:  Neuro deficit, acute, stroke suspected EXAM: CT HEAD WITHOUT CONTRAST TECHNIQUE: Contiguous axial images were obtained from the base of the skull through the vertex without intravenous contrast. COMPARISON:  None. FINDINGS: Brain: No evidence of acute infarction, hemorrhage, hydrocephalus, extra-axial collection or mass lesion/mass effect. Small discrete hypodensity in the inferior right basal ganglia, most likely a benign dilated perivascular space given characteristic location. Vascular: No hyperdense vessel identified. Skull: No acute fracture. Sinuses/Orbits: Mild paranasal sinus mucosal thickening. Unremarkable orbits. Other: No mastoid effusions. IMPRESSION: No evidence of acute intracranial abnormality. Electronically Signed   By: Margaretha Sheffield M.D.   On: 06/15/2021 10:22   DG Chest Portable 1 View  Result Date: 06/15/2021 CLINICAL DATA:  Right side chest pain EXAM: PORTABLE CHEST 1 VIEW COMPARISON:  05/20/2020 FINDINGS: Prior median sternotomy. Heart and mediastinal contours are within normal limits. No focal opacities or effusions. No acute bony abnormality. IMPRESSION: No active disease. Electronically Signed   By: Rolm Baptise M.D.   On: 06/15/2021 09:28   CT Angio Chest/Abd/Pel for Dissection W and/or Wo Contrast  Result Date: 06/15/2021 CLINICAL DATA:  Abdominal pain, chest pain, left-sided numbness EXAM: CT ANGIOGRAPHY CHEST, ABDOMEN AND PELVIS TECHNIQUE: Multidetector CT imaging through the chest, abdomen and pelvis was performed using the standard protocol during bolus administration of intravenous contrast. Multiplanar reconstructed images and MIPs were obtained and reviewed to evaluate the vascular anatomy. CONTRAST:  186mL OMNIPAQUE IOHEXOL 350 MG/ML SOLN  COMPARISON:  09/02/2020 and previous FINDINGS: CTA CHEST FINDINGS Cardiovascular: Heart size normal. No pericardial effusion. Fair contrast opacification of the pulmonary arterial tree; the exam was not optimized for detection of pulmonary emboli. There is good contrast opacification of the thoracic aorta. Previous tube graft ascending aorta repair. Classic 3 vessel brachiocephalic arterial origin anatomy without proximal stenosis. Stable residual dissection flap through the arch and descending thoracic aorta which measures 3.5 cm maximum transverse diameter below the inferior left pulmonary vein (previously 3.3). Mediastinum/Nodes: No mass or adenopathy. Lungs/Pleura: No pleural effusion. No pneumothorax. Lungs are clear. Musculoskeletal: Sternotomy wires. No fracture or worrisome bone lesion Review of the MIP images confirms the above findings. CTA ABDOMEN AND PELVIS FINDINGS VASCULAR Aorta: Dissection continues through the  length of the abdominal aorta as before. True and false lumens remain patent. No aneurysm or stenosis. Celiac: Patent, supplied by true lumen SMA: Patent, supplied by true lumen Renals: Single left, supplied by false lumen. Single right, supplied by true lumen. IMA: Patent, dissection flap crosses the origin without evident extension into the vessel. Inflow: Right iliac arterial system supplied by true lumen, patent without dissection or stenosis. On the left, the dissection flap extends into the common femoral artery with patency of true and false lumens. There is 2 cm dilatation of the distal common iliac. Veins: No obvious venous abnormality within the limitations of this arterial phase study. Review of the MIP images confirms the above findings. NON-VASCULAR Hepatobiliary: No focal liver abnormality is seen. Status post cholecystectomy. No biliary dilatation. Pancreas: Unremarkable. No pancreatic ductal dilatation or surrounding inflammatory changes. Spleen: Normal in size without focal  abnormality. Adrenals/Urinary Tract: Adrenal glands are unremarkable. Kidneys are normal, without renal calculi, focal lesion, or hydronephrosis. Bladder is unremarkable. Stomach/Bowel: Stomach is within normal limits. Appendix appears normal. No evidence of bowel wall thickening, distention, or inflammatory changes. Lymphatic: No abdominal or pelvic adenopathy. Reproductive: Prostate is unremarkable. Other: No ascites.  No free air. Musculoskeletal: Degenerative disc disease L5-S1. Negative for fracture or worrisome bone lesion. Review of the MIP images confirms the above findings. IMPRESSION: 1. No acute findings. 2. Stable appearance of ascending aortic repair with residual dissection through the arch, descending abdominal aortic segments through to the left common femoral artery. 3. 2 cm fusiform dilatation of distal left common iliac artery. Electronically Signed   By: Lucrezia Europe M.D.   On: 06/15/2021 12:07   CT Angio Chest/Abd/Pel for Dissection W and/or Wo Contrast  Result Date: 06/15/2021 CLINICAL DATA:  Aortic dissection, repeat exam EXAM: CT ANGIOGRAPHY CHEST, ABDOMEN AND PELVIS TECHNIQUE: Non-contrast CT of the chest was initially obtained. Multidetector CT imaging through the chest, abdomen and pelvis was performed using the standard protocol during bolus administration of intravenous contrast. Multiplanar reconstructed images and MIPs were obtained and reviewed to evaluate the vascular anatomy. CONTRAST:  145mL OMNIPAQUE IOHEXOL 350 MG/ML SOLN COMPARISON:  06/15/2021, 10:03 a.m., 09/02/2020 FINDINGS: CTA CHEST FINDINGS Cardiovascular: Preferential opacification of the thoracic aorta. 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 The remnant dissection flap arises from the proximal arch, subtly extending into the innominate artery origin (series 5, image 33), without involvement of the remaining arch vessel  origins, and extends to the abdominal aorta. Caliber of the tubular ascending thoracic aorta is unchanged at 3.9 x 3.8 cm. The arch is mildly enlarged, measuring up to 3.2 x 2.8 cm, and the descending thoracic aorta is enlarged measuring up to 3.9 x 3.8 cm (series 5, image 69). Cardiomegaly. Left ventricular hypertrophy. No pericardial effusion. Mediastinum/Nodes: No enlarged mediastinal, hilar, or axillary lymph nodes. Thyroid gland, trachea, and esophagus demonstrate no significant findings. Lungs/Pleura: Lungs are clear. No pleural effusion or pneumothorax. Musculoskeletal: No chest wall abnormality. No acute or significant osseous findings. Review of the MIP images confirms the above findings. CTA ABDOMEN AND PELVIS FINDINGS VASCULAR Redemonstrated dissection extending through the abdominal aorta, which is nonaneurysmal, measuring up to 2.9 x 2.9 cm (series 5, image 196). The dissection flap is fenestrated, and the true lumen supplies the celiac axis, superior mesenteric artery, right renal arteries, and inferior mesenteric artery, the false lumen supplying the left renal arteries. There are duplicated right renal arteries, a very  small accessory superior pole right renal artery, and likely four left renal arteries, multiple small duplicated superior pole left renal arteries. Dissection flap extends into the left common iliac and external iliac arteries and terminates in the left common femoral artery. The left common iliac artery is mildly enlarged measuring up to 1.8 x 1.8 cm (series 5, image 235). Review of the MIP images confirms the above findings. NON-VASCULAR Hepatobiliary: No focal liver abnormality is seen. Hepatic steatosis. Status post cholecystectomy. No biliary dilatation. Pancreas: Unremarkable. No pancreatic ductal dilatation or surrounding inflammatory changes. Spleen: Normal in size without significant abnormality. Adrenals/Urinary Tract: Adrenal glands are unremarkable. Kidneys are normal,  without renal calculi, solid lesion, or hydronephrosis. Bladder is unremarkable. Stomach/Bowel: Stomach is within normal limits. Appendix appears normal. No evidence of bowel wall thickening, distention, or inflammatory changes. Lymphatic: No enlarged abdominal or pelvic lymph nodes. Reproductive: No mass or other significant abnormality. Other: No abdominal wall hernia or abnormality. No abdominopelvic ascites. Musculoskeletal: No acute or significant osseous findings. Review of the MIP images confirms the above findings. IMPRESSION: 1. 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 Overall caliber and configuration of the dissected vessel is unchanged in comparison to both prior same day examination and examination dated 09/02/2020. The remnant dissection flap arises from the proximal arch, subtly extending into the innominate artery origin, without involvement of the remaining arch vessel origins, and extends to the abdominal aorta. 2. Caliber of the tubular ascending thoracic aorta post graft repair is unchanged at 3.9 x 3.8 cm. The arch is mildly enlarged, measuring up to 3.2 x 2.8 cm, and the descending thoracic aorta is enlarged measuring up to 3.9 x 3.8 cm, both unchanged. 3. The dissected abdominal aorta is nonaneurysmal, measuring up to 2.9 x 2.9 cm. The abdominal portion of the dissection flap is fenestrated, and the true lumen supplies the celiac axis, superior mesenteric artery, right renal arteries, and inferior mesenteric artery, the false lumen supplying the left renal arteries. 4. Dissection flap extends into the left common iliac and external iliac arteries and terminates in the left common femoral artery. The left common iliac artery is mildly enlarged measuring up to 1.8 x 1.8 cm. 5. Cardiomegaly and left ventricular hypertrophy. 6. Hepatic steatosis. Electronically Signed   By: Delanna Ahmadi M.D.   On:  06/15/2021 11:54    CARDIAC DATABASE: EKG: 06/15/2021: NSR, 77 bpm, right axis, biatrial enlargement, occasional PVCs, ST-T changes high lateral and lateral leads due to possible underlying ischemia versus repolarization due to LVH.  Similar ST-T changes on prior EKG dated 09/03/2020.   06/15/2021: NSR, 74 bpm, right axis, biatrial enlargement, prolonged QT, ST-T changes suggestive of possible ischemia versus repolarization abnormality.  Echocardiogram: 06/16/2021: LVEF 50-55%, moderate to severe LVH, indeterminate diastolic function, elevated LAP, trivial AI, estimated RAP 8 mmHg.  Stress test: 06/16/2021: 1. Large inferior wall myocardial perfusion defect shows partial reversibility in the mid and apical regions, suspicious for inferior wall myocardial infarct with peri-infarct ischemia.   2. Inferior and septal wall hypokinesis.   3. Left ventricular ejection fraction 36%   4. Non invasive risk stratification*: High  Heart catheterization: None  RADIOLOGY: CT chest abdomen pelvis dissection protocol 04/22/2020: 1. 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 Recommend immediate vascular surgery consultation. 2. Dissection flap extends through the entire the aorta terminating in the LEFT common iliac artery. 3.  Dissection flap enters the superior mesenteric artery with PARTIAL OCCLUSION. 4. No significant aneurysmal dilatation of the aorta. 5. Renal arteries opacify. 6. Celiac trunk and IMA are patent. 7. Rounded lesion at the tail the pancreas either represents a primary pancreatic lesion large splenule. Recommend non emergent contrast MRI of the abdomen for further evaluation.   CTA chest abdomen pelvis dissection protocol 06/15/2021: 1. 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 Overall caliber and configuration of the  dissected vessel is unchanged in comparison to both prior same day examination and examination dated 09/02/2020. The remnant dissection flap arises from the proximal arch, subtly extending into the innominate artery origin, without involvement of the remaining arch vessel origins, and extends to the abdominal aorta. 2. Caliber of the tubular ascending thoracic aorta post graft repair is unchanged at 3.9 x 3.8 cm. The arch is mildly enlarged, measuring up to 3.2 x 2.8 cm, and the descending thoracic aorta is enlarged measuring up to 3.9 x 3.8 cm, both unchanged. 3. The dissected abdominal aorta is nonaneurysmal, measuring up to 2.9 x 2.9 cm. The abdominal portion of the dissection flap is fenestrated, and the true lumen supplies the celiac axis, superior mesenteric artery, right renal arteries, and inferior mesenteric artery, the false lumen supplying the left renal arteries. 4. Dissection flap extends into the left common iliac and external iliac arteries and terminates in the left common femoral artery. The left common iliac artery is mildly enlarged measuring up to 1.8 x 1.8 cm. 5. Cardiomegaly and left ventricular hypertrophy. 6. Hepatic steatosis.  Scheduled Meds:  amLODipine  10 mg Oral Daily   cloNIDine  0.2 mg Oral BID   gabapentin  300 mg Oral QHS   heparin  5,000 Units Subcutaneous Q8H   labetalol  300 mg Oral BID   lisinopril  20 mg Oral Daily   nicotine  7 mg Transdermal Q24H   pantoprazole  40 mg Oral Daily    Continuous Infusions:   PRN Meds: acetaminophen, morphine injection, ondansetron (ZOFRAN) IV   IMPRESSION & RECOMMENDATIONS: Jamonta Zev Blue is a 45 y.o. Caucasian male whose past medical history and cardiac risk factors include: Hypertension, history of type A dissection, history of polycythemia, obesity due to excess calories, cigarette smoking.  Precordial pain/elevated troponin due to type II MI (supply demand ischemia)/abnormal EKG & nuclear  stress test: Continues to have precordial discomfort despite improvement in blood pressure, has multiple cardiovascular risk factors, ST-T changes on EKG.  The shared decision was to proceed with nuclear stress test to evaluate for reversible ischemia. Nuclear stress test was reported to be high risk with findings of inferior wall infarct with peri-infarct ischemia, dilated LV, and moderately reduced LVEF with regional wall motion abnormalities. Discussed proceeding with left heart catheterization with possible intervention with the patient during afternoon rounds in the presence of his nurse.  The procedure of left heart catheterization with possible intervention was explained to the patient in detail.  The indication, alternatives, risks and benefits were reviewed.  Complications include but not limited to bleeding, infection, vascular injury, stroke, myocardial infection, arrhythmia, kidney injury requiring short-term or long-term hemodialysis, temporary or permanent pacemaker, radiation-related injury in the case of prolonged fluoroscopy use, emergency cardiac surgery, and death. The patient understands the risks of serious complication is 1-2 in 4010 with diagnostic cardiac cath and 1-2% or less with angioplasty/stenting. The patient voices understanding and provides verbal feedback and  wishes to proceed with coronary angiography with possible PCI.  Hypertensive emergency: Resolved. On admission SBP 220/138 mmHg with chest pain and elevated troponins Treated with IV nitro and esmolol drip Blood pressures have improved. UDS positive for opioids BNP slightly elevated.  Benign essential hypertension: Medications reconciled. Increase lisinopril to 40 mg p.o. daily Add hydrochlorothiazide 25 mg p.o. daily We emphasized the importance of low-salt diet.  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 Roxan Hockey and will  verbally was notified that no additional recommendations needed. No formal consult from cardiothoracic surgery. Spoke to Dr. Kipp Brood during morning rounds no additional recommendations.  History of polycythemia: recommend outpatient follow-up  Cigarette smoking: Educated importance of complete smoking cessation.  Patient is educated extensively with regards to longitudinal follow-up with primary and cardiology once discharged.  I also had the office reach out to him to make a transitional care follow-up visit which she refused at this time.  Patient stated that he will call us back once discharged to proceed forward.  Patient's questions and concerns were addressed to his satisfaction. He voices understanding of the instructions provided during this encounter.   This note was created using a voice recognition software as a result there may be grammatical errors inadvertently enclosed that do not reflect the nature of this encounter. Every attempt is made to correct such errors.  Total time spent: 35 minutes.  Mechele Claude [facility]  Pager: [phone] Office: [phone] 06/16/2021, 9:33 AM

## 2021-06-16 NOTE — Interval H&P Note (Signed)
History and Physical Interval Note:  06/16/2021 6:18 PM  Richard Patterson  has presented today for surgery, with the diagnosis of abnormal nuc.  The various methods of treatment have been discussed with the patient and family. After consideration of risks, benefits and other options for treatment, the patient has consented to  Procedure(s): LEFT HEART CATH AND CORONARY ANGIOGRAPHY (N/A) as a surgical intervention.  The patient's history has been reviewed, patient examined, no change in status, stable for surgery.  I have reviewed the patient's chart and labs.  Questions were answered to the patient's satisfaction.     [name]

## 2021-06-17 LAB — BASIC METABOLIC PANEL
Anion gap: 7 (ref 5–15)
BUN: 9 mg/dL (ref 6–20)
CO2: 26 mmol/L (ref 22–32)
Calcium: 8.6 mg/dL — ABNORMAL LOW (ref 8.9–10.3)
Chloride: 103 mmol/L (ref 98–111)
Creatinine, Ser: 0.85 mg/dL (ref 0.61–1.24)
GFR, Estimated: >60 mL/min (ref >60)
Glucose, Bld: 92 mg/dL (ref 70–99)
Potassium: 3.2 mmol/L — ABNORMAL LOW (ref 3.5–5.1)
Sodium: 136 mmol/L (ref 135–145)

## 2021-06-17 LAB — MAGNESIUM: Magnesium: 2.1 mg/dL (ref 1.7–2.4)

## 2021-06-17 MED ORDER — SPIRONOLACTONE 25 MG PO TABS
25.0000 mg | ORAL_TABLET | Freq: Once | ORAL | Status: DC
  Filled 2021-06-17: qty 1

## 2021-06-17 MED ORDER — HYDROCHLOROTHIAZIDE 25 MG PO TABS: 25.0000 mg | ORAL_TABLET | Freq: Every morning | ORAL | 0 refills | Status: AC

## 2021-06-17 MED ORDER — SPIRONOLACTONE 25 MG PO TABS: 25.0000 mg | ORAL_TABLET | Freq: Every morning | ORAL | 11 refills | Status: AC

## 2021-06-17 MED ORDER — ASPIRIN 81 MG PO TBEC: 81.0000 mg | DELAYED_RELEASE_TABLET | Freq: Every day | ORAL | 11 refills | Status: AC

## 2021-06-17 MED ORDER — NICOTINE 7 MG/24HR TD PT24: 7.0000 mg | MEDICATED_PATCH | TRANSDERMAL | 0 refills | Status: AC

## 2021-06-17 MED ORDER — CLONIDINE HCL 0.2 MG PO TABS
0.2000 mg | ORAL_TABLET | Freq: Two times a day (BID) | ORAL | 0 refills | Status: AC
Start: 2021-06-17 — End: 2021-07-17

## 2021-06-17 MED ORDER — AMLODIPINE BESYLATE 10 MG PO TABS: 10.0000 mg | ORAL_TABLET | Freq: Every day | ORAL | 0 refills | Status: AC

## 2021-06-17 MED ORDER — LABETALOL HCL 300 MG PO TABS: 300.0000 mg | ORAL_TABLET | Freq: Two times a day (BID) | ORAL | 0 refills | Status: AC

## 2021-06-17 MED ORDER — ASPIRIN EC 81 MG PO TBEC
81.0000 mg | DELAYED_RELEASE_TABLET | Freq: Every day | ORAL | Status: DC
Start: 2021-06-17 — End: 2021-06-17
  Administered 2021-06-17: 81 mg via ORAL
  Filled 2021-06-17: qty 1

## 2021-06-17 MED ORDER — LISINOPRIL 40 MG PO TABS: 40.0000 mg | ORAL_TABLET | Freq: Every day | ORAL | 0 refills | Status: AC

## 2021-06-17 NOTE — Progress Notes (Signed)
Went over discharge instructions with the patient. Patient verbalize understanding about medication changes and where to pick up the medication. RN gave patient's tonight doses of labetalol and clonidine and tomorrow morning dose of spirolactone to take because patient's pharmacy is not open until tomorrow. Patient is made aware of follow up visits and the instructions of when to return back to work. PIV and tele monitor have been removed and CCMD has been notified.

## 2021-06-17 NOTE — Discharge Summary (Signed)
Physician Discharge Summary  Patient ID: Richard Patterson MRN: [id number] DOB/AGE: 46-Oct-1977 46 y.o.  Admit date: 06/15/2021 Discharge date: 06/17/2021  Primary Discharge Diagnosis: Hypertensive emergency Precordial pain Type II myocardial infarction  Secondary Discharge Diagnosis: History of type a aortic dissection status postsurgical repair 03/2020 Polycythemia. Cigarette smoking Obesity due to excess calories.  Hospital Course:   45 y.o. Caucasian male  with  Hypertension, history of type A dissection, history of polycythemia, obesity due to excess calories, cigarette smoking.  Presented to the hospital with acute chest pain similar to his presentation back in 04/13/2020 when he suffered a type aortic dissection.  Patient was found to have hypertensive emergency and placed on esmolol and nitro drip and his CT dissection protocol was performed.  Patient was noted to have no acute dissection.  ED physician reached out to cardiothoracic surgery and was verbally told no additional work-up needed from their standpoint.  Cardiology was called to admit the patient for further evaluation.  As his blood pressure medications were initiated patient's chest discomfort was improving but not completely resolved.  Patient states that he had stopped taking his antihypertensive medications 2 days prior to his arrival to the ED.  Given his risk factors he underwent a nuclear stress test which was reported to be high risk and subsequently underwent left heart catheterization.  No obstructive CAD per angiography.  Patient has not had any postprocedural complication and wishes to go home today.  He denies any active chest pain or shortness of breath at rest or with effort related activities.  Our office reached out to him prior to this holiday weekend to have a follow-up appointment scheduled; however, he refused and stated he would call us back.  Discharge Exam: PHYSICAL EXAM: Vitals with BMI  06/17/2021 06/17/2021 06/17/2021  Height - - -  Weight - - 287 lbs 14 oz  BMI - - 77.82  Systolic 423 536 144  Diastolic 99 93 95  Pulse 66 69 68    CONSTITUTIONAL: Appears older than stated age, hemodynamically stable.  Well-developed and well-nourished. SKIN: Skin is warm and dry. No rash noted. No cyanosis. No pallor. No jaundice HEAD: Normocephalic and atraumatic.  EYES: No scleral icterus MOUTH/THROAT: Moist oral membranes.  NECK: No JVD present. No thyromegaly noted. No carotid bruits  LYMPHATIC: No visible cervical adenopathy.  CHEST Normal respiratory effort. No intercostal retractions.  Prior sternotomy site is well-healed. LUNGS: Clear to auscultation bilaterally.  No stridor. No wheezes. No rales.  CARDIOVASCULAR: Regular rate and rhythm, positive R1-V4, soft holosystolic murmur heard at the apex, no murmurs rubs or gallops appreciated ABDOMINAL: Obese, soft, nontender, nondistended, positive bowel sounds in all 4 quadrants no apparent ascites.  EXTREMITIES: No pitting edema, warm to touch.  Right radial site appropriate hemostasis and no bruit or hematoma present HEMATOLOGIC: No significant bruising NEUROLOGIC: Oriented to person, place, and time. Nonfocal. Normal muscle tone.  PSYCHIATRIC: Normal mood and affect. Normal behavior. Cooperative   Recommendations on discharge:  #1 educated on the importance of medication compliance and follow-up with PCP and cardiology as outpatient.  #2 given his history of type a aortic dissection and extensive surgery back in September 2021 follow-up visit with CT surgery going forward.  He will be provided with contact information on his discharge documentation.  #3 medications refilled and sent to pharmacy.  Educated on importance of low-salt diet and goal systolic blood pressure of 120 mmHg if able to tolerate in the setting of history of type A aortic  dissection.  #4 educated on importance of complete smoking cessation.  #5 patient  is aware that he will need labs in one week to check renal function and electrolytes given his blood pressure medication titration. He can call our office or his PCP to schedule this after discharge.   CARDIAC DATABASE: EKG: 06/15/2021: NSR, 77 bpm, right axis, biatrial enlargement, occasional PVCs, ST-T changes high lateral and lateral leads due to possible underlying ischemia versus repolarization due to LVH.  Similar ST-T changes on prior EKG dated 09/03/2020.   06/15/2021: NSR, 74 bpm, right axis, biatrial enlargement, prolonged QT, ST-T changes suggestive of possible ischemia versus repolarization abnormality.   Echocardiogram: 06/16/2021: LVEF 50-55%, moderate to severe LVH, indeterminate diastolic function, elevated LAP, trivial AI, estimated RAP 8 mmHg.   Stress test: 06/16/2021: 1. Large inferior wall myocardial perfusion defect shows partial reversibility in the mid and apical regions, suspicious for inferior wall myocardial infarct with peri-infarct ischemia.   2. Inferior and septal wall hypokinesis.   3. Left ventricular ejection fraction 36%   4. Non invasive risk stratification*: High   Heart catheterization:   There is mild left ventricular systolic dysfunction.   LV end diastolic pressure is severely elevated.   The left ventricular ejection fraction is 45-50% by visual estimate.   Left Heart Catheterization 06/16/21:  LV: 185/12, EDP 27 mmHg.  Ao: 170/97, mean 125 mmHg.  No pressure gradient across the aortic valve. LV: Mild global hypokinesis, mild LV dilatation, EF 45 to 50%. RCA: Codominant with circumflex.  Smooth and normal. LM: Normal LCx: Codominant, normal RI: Smooth and normal LAD: Large vessel, gives origin to large D1.  Smooth and normal.  Impression: Severe elevation in LVEDP, nonischemic cardiomyopathy, probably hypertensive heart disease.  50 mL contrast utilized.  Labs:   Lab Results  Component Value Date   WBC 10.8 (H) 06/15/2021   HGB 17.5  (H) 06/15/2021   HCT 49.5 06/15/2021   MCV 91.2 06/15/2021   PLT 168 06/15/2021    Recent Labs  Lab 06/15/21 0906 06/15/21 0940 06/17/21 0334  NA 139   < > 136  K 3.6   < > 3.2*  CL 106   < > 103  CO2 24   < > 26  BUN 13   < > 9  CREATININE 0.92   < > 0.85  CALCIUM 8.8*   < > 8.6*  PROT 6.9  --   --   BILITOT 0.9  --   --   ALKPHOS 66  --   --   ALT 36  --   --   AST 39  --   --   GLUCOSE 101*   < > 92   < > = values in this interval not displayed.    Lipid Panel     Component Value Date/Time   CHOL 150 06/16/2021 0306   TRIG 138 06/16/2021 0306   HDL 41 06/16/2021 0306   CHOLHDL 3.7 06/16/2021 0306   VLDL 28 06/16/2021 0306   LDLCALC 81 06/16/2021 0306    BNP (last 3 results) Recent Labs    06/15/21 2011  BNP 204.3*    HEMOGLOBIN A1C Lab Results  Component Value Date   HGBA1C 5.2 04/25/2020   MPG 102.54 04/25/2020    Cardiac Panel (last 3 results) No results for input(s): CKTOTAL, CKMB, TROPONINI, RELINDX in the last 8760 hours.  Lab Results  Component Value Date   TROPONINI <0.03 03/04/2018     TSH Recent Labs  06/15/21 1345  TSH 3.525    Radiology: CT Head Wo Contrast  Result Date: 06/15/2021 CLINICAL DATA:  Neuro deficit, acute, stroke suspected EXAM: CT HEAD WITHOUT CONTRAST TECHNIQUE: Contiguous axial images were obtained from the base of the skull through the vertex without intravenous contrast. COMPARISON:  None. FINDINGS: Brain: No evidence of acute infarction, hemorrhage, hydrocephalus, extra-axial collection or mass lesion/mass effect. Small discrete hypodensity in the inferior right basal ganglia, most likely a benign dilated perivascular space given characteristic location. Vascular: No hyperdense vessel identified. Skull: No acute fracture. Sinuses/Orbits: Mild paranasal sinus mucosal thickening. Unremarkable orbits. Other: No mastoid effusions. IMPRESSION: No evidence of acute intracranial abnormality. Electronically Signed   By:  Margaretha Sheffield M.D.   On: 06/15/2021 10:22   CARDIAC CATHETERIZATION  Result Date: 06/16/2021   There is mild left ventricular systolic dysfunction.   LV end diastolic pressure is severely elevated.   The left ventricular ejection fraction is 45-50% by visual estimate. Left Heart Catheterization 06/16/21: LV: 185/12, EDP 27 mmHg.  Ao: 170/97, mean 125 mmHg.  No pressure gradient across the aortic valve. LV: Mild global hypokinesis, mild LV dilatation, EF 45 to 50%. RCA: Codominant with circumflex.  Smooth and normal. LM: Normal LCx: Codominant, normal RI: Smooth and normal LAD: Large vessel, gives origin to large D1.  Smooth and normal. Impression: Severe elevation in LVEDP, nonischemic cardiomyopathy, probably hypertensive heart disease.  50 mL contrast utilized.   NM Myocar Multi W/Spect W/Wall Motion / EF  Result Date: 06/16/2021 CLINICAL DATA:  Chest pain.  Abnormal EKG. EXAM: MYOCARDIAL IMAGING WITH SPECT (REST AND EXERCISE) GATED LEFT VENTRICULAR WALL MOTION STUDY LEFT VENTRICULAR EJECTION FRACTION TECHNIQUE: Standard myocardial SPECT imaging was performed after resting intravenous injection of 11.0 mCi Tc-40m tetrofosmin. Subsequently, exercise tolerance test was performed by the patient under the supervision of the Cardiology staff. At peak-stress, 30.8 mCi Tc-41m tetrofosminwas injected intravenously and standard myocardial SPECT imaging was performed. Quantitative gated imaging was also performed to evaluate left ventricular wall motion, and estimate left ventricular ejection fraction. COMPARISON:  None. FINDINGS: Perfusion: A large inferior wall defect is seen on stress images, which shows partial reversibility in the mid and apical portions on resting images. This is suspicious for inferior wall myocardial infarct, with peri-infarct ischemia. Wall Motion: Left ventricular dilatation is seen. Inferior and septal wall hypokinesis is noted. Left Ventricular Ejection Fraction: 36 % End diastolic  volume 416 ml End systolic volume 384 ml IMPRESSION: 1. Large inferior wall myocardial perfusion defect shows partial reversibility in the mid and apical regions, suspicious for inferior wall myocardial infarct with peri-infarct ischemia. 2. Inferior and septal wall hypokinesis. 3. Left ventricular ejection fraction 36% 4. Non invasive risk stratification*: High *2012 Appropriate Use Criteria for Coronary Revascularization Focused Update: J Am Coll Cardiol. 5364;68(0):321-224. http://content.airportbarriers.com.aspx?articleid=[email or web address] Electronically Signed   By: Marlaine Hind M.D.   On: 06/16/2021 12:38   DG Chest Portable 1 View  Result Date: 06/15/2021 CLINICAL DATA:  Right side chest pain EXAM: PORTABLE CHEST 1 VIEW COMPARISON:  05/20/2020 FINDINGS: Prior median sternotomy. Heart and mediastinal contours are within normal limits. No focal opacities or effusions. No acute bony abnormality. IMPRESSION: No active disease. Electronically Signed   By: Rolm Baptise M.D.   On: 06/15/2021 09:28   ECHOCARDIOGRAM COMPLETE  Result Date: 06/16/2021    ECHOCARDIOGRAM REPORT   Patient Name:   PRESS CASALE Date of Exam: 06/16/2021 Medical Rec #:  [id number]  Height:       74.0 in Accession #:    [id number]            Weight:       295.2 lb Date of Birth:  04-19-1976              BSA:          2.565 m Patient Age:    42 years              BP:           146/68 mmHg Patient Gender: M                     HR:           67 bpm. Exam Location:  Inpatient Procedure: 2D Echo, Cardiac Doppler, Color Doppler and Intracardiac            Opacification Agent Indications:     R07.9* Chest pain, unspecified  History:         Patient has no prior history of Echocardiogram examinations.                  Risk Factors:Hypertension and Current Smoker. Aortic dissection                  repair.  Sonographer:     Roseanna Rainbow RDCS Referring Phys:  [id number] Rex Kras Diagnosing Phys: Rex Kras DO  Sonographer Comments:  Technically difficult study due to poor echo windows and patient is morbidly obese. Image acquisition challenging due to patient body habitus. IMPRESSIONS  1. Left ventricular ejection fraction, by estimation, is 50 to 55%. The left ventricle has low normal function. The left ventricle has no regional wall motion abnormalities. Moderate to severe left ventricular hypertrophy. Left ventricular diastolic parameters are indeterminate. Elevated left atrial pressure.  2. Right ventricular systolic function is normal. The right ventricular size is normal. There is normal pulmonary artery systolic pressure.  3. The mitral valve is normal in structure. No evidence of mitral valve regurgitation. No evidence of mitral stenosis.  4. The aortic valve is normal in structure. Aortic valve regurgitation is trivial. Aortic valve sclerosis/calcification is present, without any evidence of aortic stenosis.  5. The inferior vena cava is dilated in size with >50% respiratory variability, suggesting right atrial pressure of 8 mmHg. FINDINGS  Left Ventricle: Left ventricular ejection fraction, by estimation, is 50 to 55%. The left ventricle has low normal function. The left ventricle has no regional wall motion abnormalities. Definity contrast agent was given IV to delineate the left ventricular endocardial borders. The left ventricular internal cavity size was normal in size. Moderate to severe left ventricular hypertrophy. Left ventricular diastolic parameters are indeterminate. Elevated left atrial pressure. Right Ventricle: The right ventricular size is normal. No increase in right ventricular wall thickness. Right ventricular systolic function is normal. There is normal pulmonary artery systolic pressure. Left Atrium: Left atrial size was normal in size. Right Atrium: Right atrial size was normal in size. Pericardium: There is no evidence of pericardial effusion. Mitral Valve: The mitral valve is normal in structure. No evidence of  mitral valve regurgitation. No evidence of mitral valve stenosis. Tricuspid Valve: The tricuspid valve is normal in structure. Tricuspid valve regurgitation is trivial. No evidence of tricuspid stenosis. Aortic Valve: The aortic valve is normal in structure. Aortic valve regurgitation is trivial. Aortic valve sclerosis/calcification is present, without any evidence of aortic stenosis. Pulmonic Valve: The pulmonic valve  was not well visualized. Pulmonic valve regurgitation is not visualized. No evidence of pulmonic stenosis. Aorta: The aortic root and ascending aorta are structurally normal, with no evidence of dilitation. Venous: The inferior vena cava is dilated in size with greater than 50% respiratory variability, suggesting right atrial pressure of 8 mmHg. IAS/Shunts: The interatrial septum was not well visualized.  LEFT VENTRICLE PLAX 2D LVIDd:         4.40 cm      Diastology LVIDs:         3.60 cm      LV e' medial:    4.35 cm/s LV PW:         1.90 cm      LV E/e' medial:  18.7 LV IVS:        1.70 cm      LV e' lateral:   3.90 cm/s LVOT diam:     2.20 cm      LV E/e' lateral: 20.8 LV SV:         73 LV SV Index:   28 LVOT Area:     3.80 cm  LV Volumes (MOD) LV vol d, MOD A2C: 168.0 ml LV vol d, MOD A4C: 125.0 ml LV vol s, MOD A2C: 91.9 ml LV vol s, MOD A4C: 69.6 ml LV SV MOD A2C:     76.1 ml LV SV MOD A4C:     125.0 ml LV SV MOD BP:      67.2 ml RIGHT VENTRICLE            IVC RV S prime:     6.98 cm/s  IVC diam: 2.60 cm TAPSE (M-mode): 2.2 cm LEFT ATRIUM             Index        RIGHT ATRIUM           Index LA diam:        4.60 cm 1.79 cm/m   RA Area:     14.30 cm LA Vol (A2C):   52.0 ml 20.28 ml/m  RA Volume:   32.00 ml  12.48 ml/m LA Vol (A4C):   49.8 ml 19.42 ml/m LA Biplane Vol: 51.0 ml 19.89 ml/m  AORTIC VALVE LVOT Vmax:   117.00 cm/s LVOT Vmean:  68.700 cm/s LVOT VTI:    0.192 m  AORTA Ao Root diam: 3.60 cm Ao Asc diam:  3.20 cm MITRAL VALVE MV Area (PHT): 3.65 cm    SHUNTS MV Decel Time: 208 msec     Systemic VTI:  0.19 m MV E velocity: 81.20 cm/s  Systemic Diam: 2.20 cm MV A velocity: 80.20 cm/s MV E/A ratio:  1.01 Rinaldo Macqueen DO Electronically signed by Rex Kras DO Signature Date/Time: 06/16/2021/4:18:43 PM    Final    CT Angio Chest/Abd/Pel for Dissection W and/or Wo Contrast  Result Date: 06/15/2021 CLINICAL DATA:  Abdominal pain, chest pain, left-sided numbness EXAM: CT ANGIOGRAPHY CHEST, ABDOMEN AND PELVIS TECHNIQUE: Multidetector CT imaging through the chest, abdomen and pelvis was performed using the standard protocol during bolus administration of intravenous contrast. Multiplanar reconstructed images and MIPs were obtained and reviewed to evaluate the vascular anatomy. CONTRAST:  168mL OMNIPAQUE IOHEXOL 350 MG/ML SOLN COMPARISON:  09/02/2020 and previous FINDINGS: CTA CHEST FINDINGS Cardiovascular: Heart size normal. No pericardial effusion. Fair contrast opacification of the pulmonary arterial tree; the exam was not optimized for detection of pulmonary emboli. There is good contrast opacification of the thoracic aorta. Previous tube graft ascending  aorta repair. Classic 3 vessel brachiocephalic arterial origin anatomy without proximal stenosis. Stable residual dissection flap through the arch and descending thoracic aorta which measures 3.5 cm maximum transverse diameter below the inferior left pulmonary vein (previously 3.3). Mediastinum/Nodes: No mass or adenopathy. Lungs/Pleura: No pleural effusion. No pneumothorax. Lungs are clear. Musculoskeletal: Sternotomy wires. No fracture or worrisome bone lesion Review of the MIP images confirms the above findings. CTA ABDOMEN AND PELVIS FINDINGS VASCULAR Aorta: Dissection continues through the length of the abdominal aorta as before. True and false lumens remain patent. No aneurysm or stenosis. Celiac: Patent, supplied by true lumen SMA: Patent, supplied by true lumen Renals: Single left, supplied by false lumen. Single right, supplied by true  lumen. IMA: Patent, dissection flap crosses the origin without evident extension into the vessel. Inflow: Right iliac arterial system supplied by true lumen, patent without dissection or stenosis. On the left, the dissection flap extends into the common femoral artery with patency of true and false lumens. There is 2 cm dilatation of the distal common iliac. Veins: No obvious venous abnormality within the limitations of this arterial phase study. Review of the MIP images confirms the above findings. NON-VASCULAR Hepatobiliary: No focal liver abnormality is seen. Status post cholecystectomy. No biliary dilatation. Pancreas: Unremarkable. No pancreatic ductal dilatation or surrounding inflammatory changes. Spleen: Normal in size without focal abnormality. Adrenals/Urinary Tract: Adrenal glands are unremarkable. Kidneys are normal, without renal calculi, focal lesion, or hydronephrosis. Bladder is unremarkable. Stomach/Bowel: Stomach is within normal limits. Appendix appears normal. No evidence of bowel wall thickening, distention, or inflammatory changes. Lymphatic: No abdominal or pelvic adenopathy. Reproductive: Prostate is unremarkable. Other: No ascites.  No free air. Musculoskeletal: Degenerative disc disease L5-S1. Negative for fracture or worrisome bone lesion. Review of the MIP images confirms the above findings. IMPRESSION: 1. No acute findings. 2. Stable appearance of ascending aortic repair with residual dissection through the arch, descending abdominal aortic segments through to the left common femoral artery. 3. 2 cm fusiform dilatation of distal left common iliac artery. Electronically Signed   By: Lucrezia Europe M.D.   On: 06/15/2021 12:07   CT Angio Chest/Abd/Pel for Dissection W and/or Wo Contrast  Result Date: 06/15/2021 CLINICAL DATA:  Aortic dissection, repeat exam EXAM: CT ANGIOGRAPHY CHEST, ABDOMEN AND PELVIS TECHNIQUE: Non-contrast CT of the chest was initially obtained. Multidetector CT  imaging through the chest, abdomen and pelvis was performed using the standard protocol during bolus administration of intravenous contrast. Multiplanar reconstructed images and MIPs were obtained and reviewed to evaluate the vascular anatomy. CONTRAST:  172mL OMNIPAQUE IOHEXOL 350 MG/ML SOLN COMPARISON:  06/15/2021, 10:03 a.m., 09/02/2020 FINDINGS: CTA CHEST FINDINGS Cardiovascular: Preferential opacification of the thoracic aorta. 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 The remnant dissection flap arises from the proximal arch, subtly extending into the innominate artery origin (series 5, image 33), without involvement of the remaining arch vessel origins, and extends to the abdominal aorta. Caliber of the tubular ascending thoracic aorta is unchanged at 3.9 x 3.8 cm. The arch is mildly enlarged, measuring up to 3.2 x 2.8 cm, and the descending thoracic aorta is enlarged measuring up to 3.9 x 3.8 cm (series 5, image 69). Cardiomegaly. Left ventricular hypertrophy. No pericardial effusion. Mediastinum/Nodes: No enlarged mediastinal, hilar, or axillary lymph nodes. Thyroid gland, trachea, and esophagus demonstrate no significant findings. Lungs/Pleura: Lungs are clear. No pleural effusion or pneumothorax. Musculoskeletal: No chest wall abnormality.  No acute or significant osseous findings. Review of the MIP images confirms the above findings. CTA ABDOMEN AND PELVIS FINDINGS VASCULAR Redemonstrated dissection extending through the abdominal aorta, which is nonaneurysmal, measuring up to 2.9 x 2.9 cm (series 5, image 196). The dissection flap is fenestrated, and the true lumen supplies the celiac axis, superior mesenteric artery, right renal arteries, and inferior mesenteric artery, the false lumen supplying the left renal arteries. There are duplicated right renal arteries, a very small accessory superior pole right  renal artery, and likely four left renal arteries, multiple small duplicated superior pole left renal arteries. Dissection flap extends into the left common iliac and external iliac arteries and terminates in the left common femoral artery. The left common iliac artery is mildly enlarged measuring up to 1.8 x 1.8 cm (series 5, image 235). Review of the MIP images confirms the above findings. NON-VASCULAR Hepatobiliary: No focal liver abnormality is seen. Hepatic steatosis. Status post cholecystectomy. No biliary dilatation. Pancreas: Unremarkable. No pancreatic ductal dilatation or surrounding inflammatory changes. Spleen: Normal in size without significant abnormality. Adrenals/Urinary Tract: Adrenal glands are unremarkable. Kidneys are normal, without renal calculi, solid lesion, or hydronephrosis. Bladder is unremarkable. Stomach/Bowel: Stomach is within normal limits. Appendix appears normal. No evidence of bowel wall thickening, distention, or inflammatory changes. Lymphatic: No enlarged abdominal or pelvic lymph nodes. Reproductive: No mass or other significant abnormality. Other: No abdominal wall hernia or abnormality. No abdominopelvic ascites. Musculoskeletal: No acute or significant osseous findings. Review of the MIP images confirms the above findings. IMPRESSION: 1. 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 Overall caliber and configuration of the dissected vessel is unchanged in comparison to both prior same day examination and examination dated 09/02/2020. The remnant dissection flap arises from the proximal arch, subtly extending into the innominate artery origin, without involvement of the remaining arch vessel origins, and extends to the abdominal aorta. 2. Caliber of the tubular ascending thoracic aorta post graft repair is unchanged at 3.9 x 3.8 cm. The arch is mildly enlarged, measuring up to 3.2 x  2.8 cm, and the descending thoracic aorta is enlarged measuring up to 3.9 x 3.8 cm, both unchanged. 3. The dissected abdominal aorta is nonaneurysmal, measuring up to 2.9 x 2.9 cm. The abdominal portion of the dissection flap is fenestrated, and the true lumen supplies the celiac axis, superior mesenteric artery, right renal arteries, and inferior mesenteric artery, the false lumen supplying the left renal arteries. 4. Dissection flap extends into the left common iliac and external iliac arteries and terminates in the left common femoral artery. The left common iliac artery is mildly enlarged measuring up to 1.8 x 1.8 cm. 5. Cardiomegaly and left ventricular hypertrophy. 6. Hepatic steatosis. Electronically Signed   By: Delanna Ahmadi M.D.   On: 06/15/2021 11:54      FOLLOW UP PLANS AND APPOINTMENTS  Allergies as of 06/17/2021       Reactions   Shellfish Allergy Hives, Rash   Shellfish Allergy Hives, Itching, Swelling, Rash        Medication List     TAKE these medications    amLODipine 10 MG tablet Commonly known as: NORVASC Take 1 tablet (10 mg total) by mouth daily. What changed:  medication strength how much to take   aspirin 81 MG EC tablet Take 1 tablet (81 mg total) by mouth daily. Swallow whole. What changed:  medication strength how much to  take additional instructions   cloNIDine 0.2 MG tablet Commonly known as: CATAPRES Take 1 tablet (0.2 mg total) by mouth 2 (two) times daily. What changed:  medication strength how much to take   gabapentin 300 MG capsule Commonly known as: Neurontin Take 1 capsule (300 mg total) by mouth at bedtime.   hydrochlorothiazide 25 MG tablet Commonly known as: HYDRODIURIL Take 1 tablet (25 mg total) by mouth in the morning.   HYDROcodone-acetaminophen 5-325 MG tablet Commonly known as: NORCO/VICODIN Take 1 tablet by mouth every 6 (six) hours as needed for moderate pain or severe pain.   labetalol 300 MG tablet Commonly known  as: NORMODYNE Take 1 tablet (300 mg total) by mouth 2 (two) times daily.   lisinopril 40 MG tablet Commonly known as: ZESTRIL Take 1 tablet (40 mg total) by mouth daily at 10 pm. What changed:  medication strength how much to take when to take this   nicotine 7 mg/24hr patch Commonly known as: NICODERM CQ - dosed in mg/24 hr Place 1 patch (7 mg total) onto the skin daily.   pantoprazole 40 MG tablet Commonly known as: PROTONIX Take 1 tablet (40 mg total) by mouth daily.   spironolactone 25 MG tablet Commonly known as: Aldactone Take 1 tablet (25 mg total) by mouth every morning.        Follow-up Information     Gaige Sebo, DO. Schedule an appointment as soon as possible for a visit in 2 week(s).   Specialties: Cardiology, Vascular Surgery Why: Will need to have labs done prior to arrival.  Call to make an appt and to have labs ordered. Contact information: [place] [address] [phone]         Martin[place], Betty G, MD Follow up in 1 week(s).   Specialty: Family Medicine Contact information: [place] [address] [phone]         Gaye Pollack, MD. Schedule an appointment as soon as possible for a visit.   Specialty: Cardiothoracic Surgery Why: Your cardiothoracic surgeon -recommend annual follow-up visit Contact information: [address] [place] [address] [phone]               Time Spent: 40 minutes.   Rex Kras, [place], [facility]  Pager: [phone] Office: [phone]

## 2021-06-17 NOTE — Plan of Care (Signed)

## 2021-06-17 NOTE — Discharge Instructions (Signed)
Excuse the patient from work from 06/15/2021 through 06/20/2021.    He may resume work on 06/21/2021 with his regular duties.  Rex Kras, [place], [facility]  Pager: [phone] Office: [phone]

## 2021-06-18 ENCOUNTER — Encounter (HOSPITAL_COMMUNITY): Payer: Self-pay | Admitting: Cardiology

## 2021-09-15 ENCOUNTER — Ambulatory Visit: Payer: Self-pay | Admitting: Surgery

## 2021-10-19 ENCOUNTER — Other Ambulatory Visit: Payer: Self-pay | Admitting: Cardiovascular Disease

## 2021-10-19 DIAGNOSIS — I10 Essential (primary) hypertension: Secondary | ICD-10-CM

## 2022-01-01 IMAGING — CT CT ANGIO CHEST-ABD-PELV FOR DISSECTION W/ AND WO/W CM
2 of 7 series · 14 of 46 positions shown, 16 images · IV contrast (APPLIED)
Comparison: CTA chest abdomen pelvis April 22, 2020.

CLINICAL DATA: History of aortic dissection, concern for recurrence
or complication

EXAM:
CT ANGIOGRAPHY CHEST, ABDOMEN AND PELVIS
TECHNIQUE: Non-contrast CT of the chest was initially obtained.

[Series 6: arterial · axial · arterial · 0.88mm/px · z∈[+810,+1330]mm · 11 of 304 slices shown, 13 images]
[im 22/304  soft-tissue]
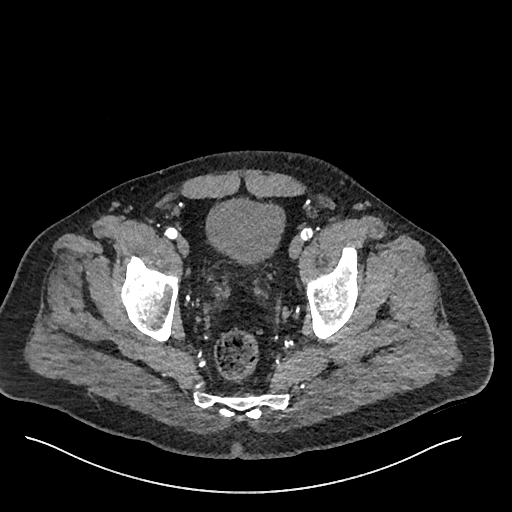
[im 22/304  bone]
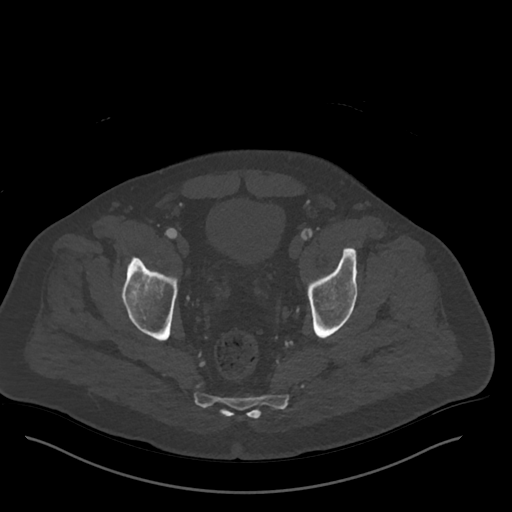
[im 44/304  soft-tissue]
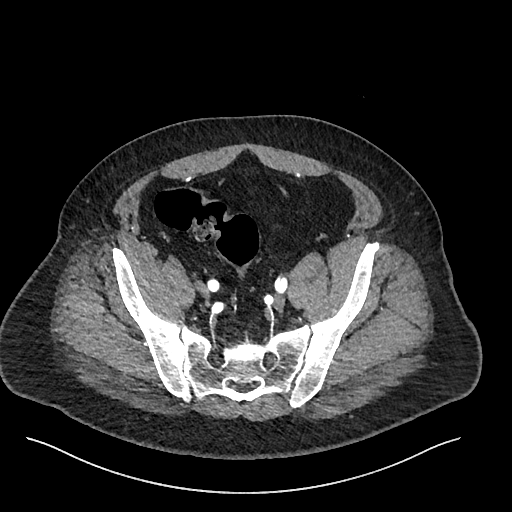
[im 65/304  soft-tissue]
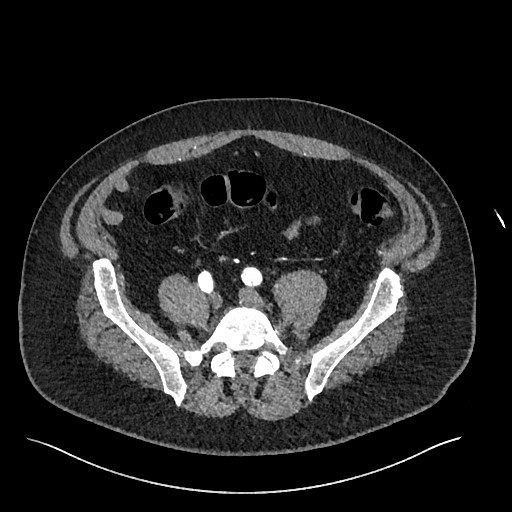
[im 109/304  soft-tissue]
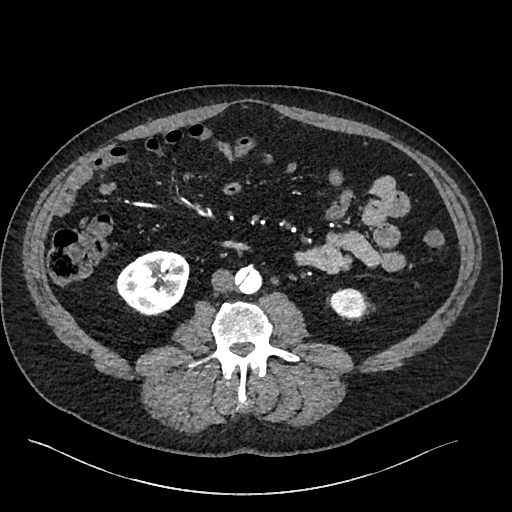
[im 130/304  soft-tissue]
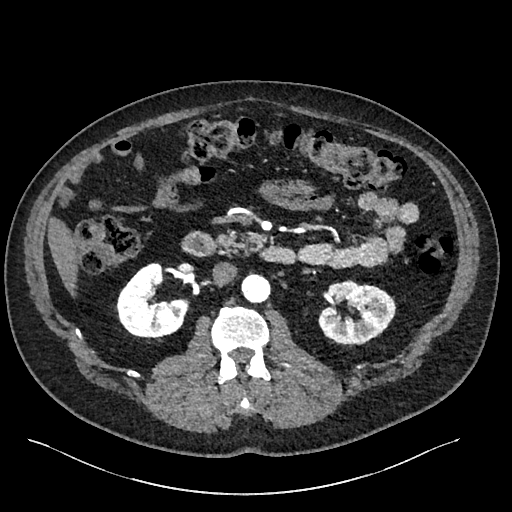
[im 152/304  soft-tissue]
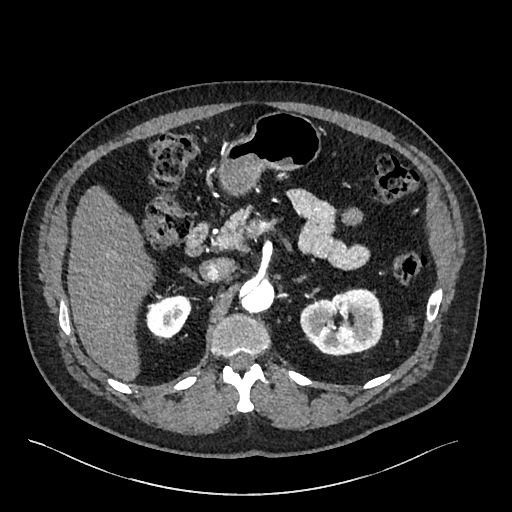
[im 174/304  soft-tissue]
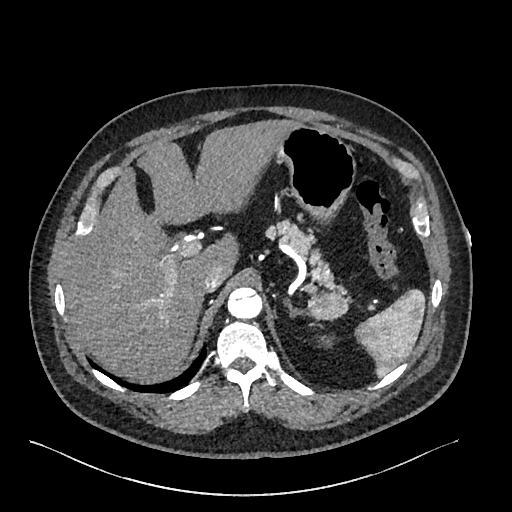
[im 195/304  soft-tissue]
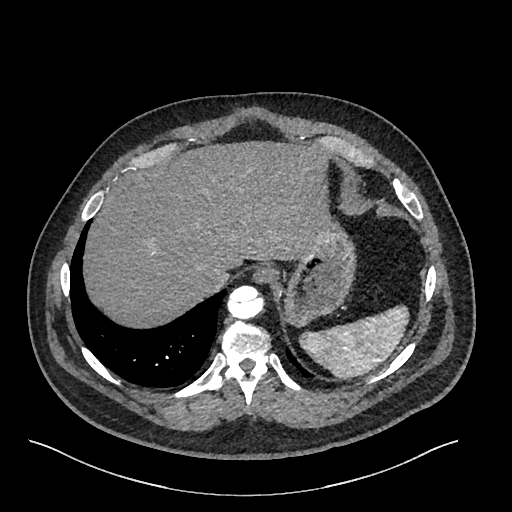
[im 239/304  soft-tissue]
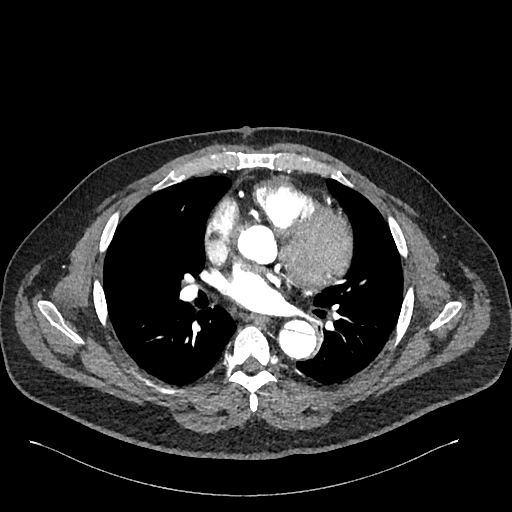
[im 239/304  bone]
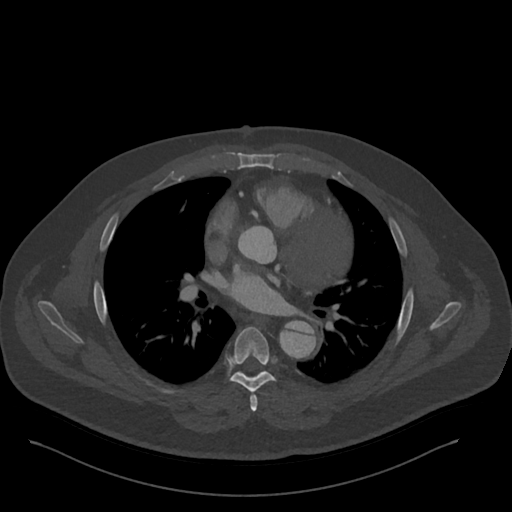
[im 260/304  soft-tissue]
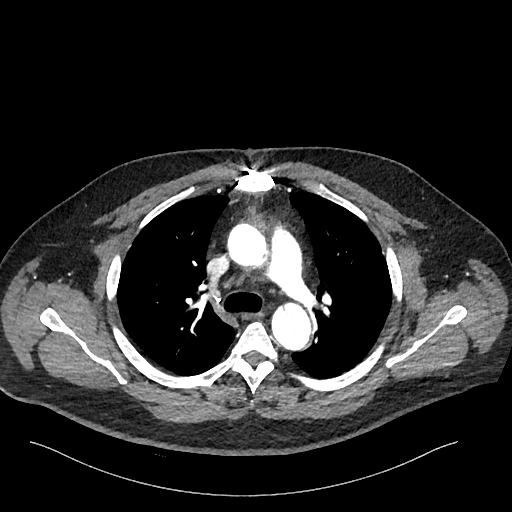
[im 282/304  soft-tissue]
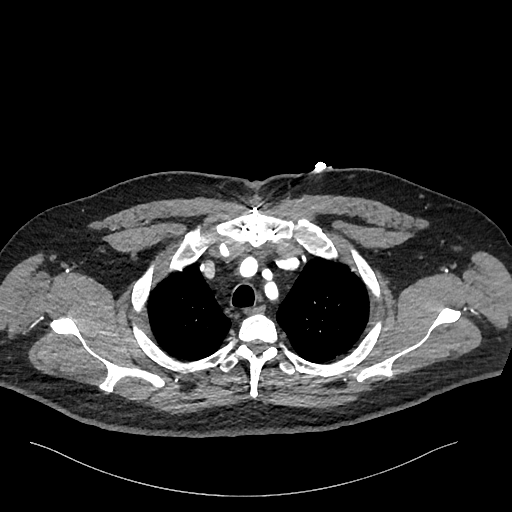

[Series 9: cor · coronal · 0.89mm/px · 3 of 151 slices shown]
[im 38/151  soft-tissue]
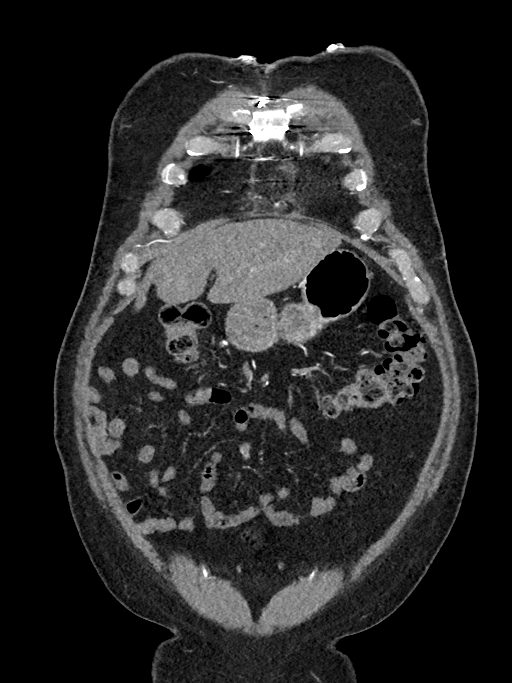
[im 76/151  soft-tissue]
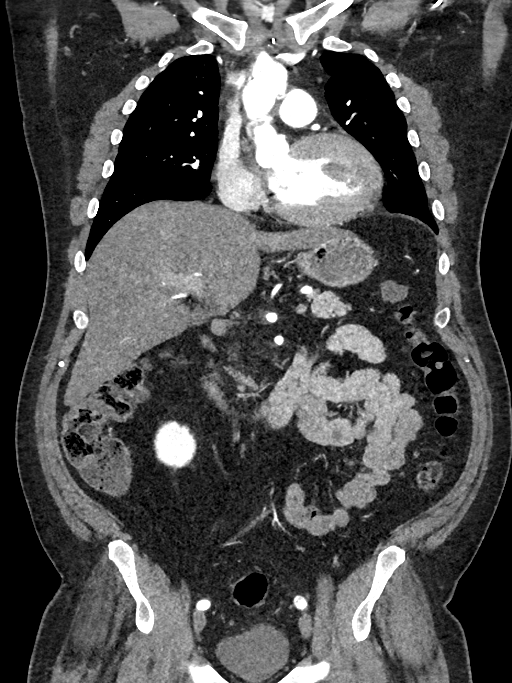
[im 113/151  soft-tissue]
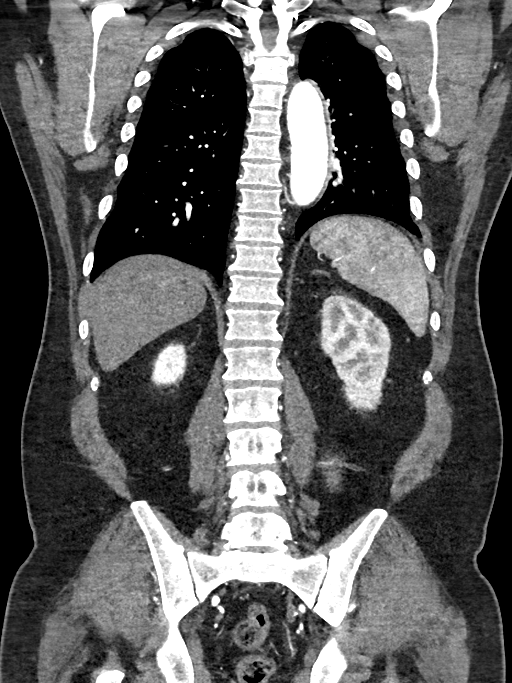

[14 of 46 positions shown; findings below may reference images not displayed]

Multidetector CT imaging through the chest, abdomen and pelvis was
performed using the standard protocol during bolus administration of
intravenous contrast. Multiplanar reconstructed images and MIPs were
obtained and reviewed to evaluate the vascular anatomy.

CONTRAST:  100mL OMNIPAQUE IOHEXOL 350 MG/ML SOLN
FINDINGS: CTA CHEST FINDINGS

Cardiovascular: Noncontrast imaging demonstrates no intramural
hematoma. Postsurgical changes of ascending aortic dissection
repair, without evidence of recurrence or complication. Proximal
coronary arteries appear opacified. Aortic dissection extending from
the proximal arch in terminating in the left external iliac artery
appears similar to prior imaging. Dissection extends into the
proximal right brachiocephalic.

The true lumen fills the left common carotid, subclavian, celiac,
SMA, right renal, IMA, right common iliac and left internal iliac
arteries.

False lumen supplies the left renal artery.

Mediastinum/Nodes: There is trace superior mediastinal fluid
adjacent to proximal aorta. Thyroids unremarkable. No mediastinal,
hilar or axillary lymphadenopathy. Trachea and esophagus appear
unremarkable.

Lungs/Pleura: No suspicious pulmonary nodules or masses. No pleural
effusion. No pneumothorax.

Musculoskeletal: Prior median sternotomy. No acute osseous
abnormality.

Review of the MIP images confirms the above findings.

CTA ABDOMEN AND PELVIS FINDINGS

VASCULAR

Aorta: Postsurgical changes of ascending aortic dissection repair,
without evidence of recurrence or complication. Aortic dissection
extending from the proximal arch in terminating in the left external
iliac artery appears similar to prior imaging.

Celiac: Opacifies normally, supplied by the true lumen.

SMA: Opacifies normally, supplied by the true lumen.

Renals: Left renal artery opacifies normally supplied by the false
lumen. Right renal artery opacifies normally opacifies normally,
supplied by the true lumen.

IMA: Opacifies normally, supplied by the true lumen

Review of the MIP images confirms the above findings.

NON-VASCULAR

Hepatobiliary: No suspicious hepatic lesions. Cholecystectomy. No
biliary ductal dilatation.

Pancreas: Unchanged size of the 2.8 cm round lesion in the tail the
pancreas which appears similar in density and enhancement to the
spleen.

Spleen: Normal in size without focal abnormality.

Adrenals/Urinary Tract: Adrenal glands are unremarkable. Kidneys are
normal, without renal calculi, focal lesion, or hydronephrosis.
Bladder is unremarkable.

Stomach/Bowel: Stomach, small bowel, terminal ileum, appendix and
colon appear normal without inflammation or evidence of obstruction.

Lymphatic: No adenopathy

Reproductive: Prostate is unremarkable.

Other: None

Musculoskeletal: No acute osseous abnormality

Review of the MIP images confirms the above findings.
IMPRESSION: Postsurgical changes ascending aortic dissection repair without
definite evidence of complication, aneurysm or leak. There is a very
small volume of mediastinal fluid adjacent to the aorta and
pulmonary artery, this could be further evaluated with ECHO.

Similar appearance of the transverse and descending aortic
dissection which terminates in the LEFT common iliac artery, further
described above.

Similar appearance of the round lesion at the tail the pancreas with
primary considerations of a primary pancreatic lesion versus large
splenule. Further evaluation with nonemergent pancreas protocol MRI
with and without contrast is recommended

These results were called by telephone at the time of interpretation
on 09/02/2020 at [DATE] to provider FABRIZIO BERLIN , who verbally
acknowledged these results.

## 2022-04-04 ENCOUNTER — Other Ambulatory Visit: Payer: Self-pay | Admitting: Surgery

## 2022-04-04 DIAGNOSIS — I7101 Dissection of ascending aorta: Secondary | ICD-10-CM

## 2022-04-04 DIAGNOSIS — Z8679 Personal history of other diseases of the circulatory system: Secondary | ICD-10-CM

## 2022-04-22 ENCOUNTER — Other Ambulatory Visit: Payer: Self-pay | Admitting: Surgery

## 2022-04-22 DIAGNOSIS — Z8679 Personal history of other diseases of the circulatory system: Secondary | ICD-10-CM

## 2022-06-06 NOTE — Progress Notes (Signed)
This encounter was created in error - please disregard.

## 2022-06-15 ENCOUNTER — Ambulatory Visit: Payer: Self-pay | Admitting: Surgery

## 2022-06-22 ENCOUNTER — Encounter: Payer: Self-pay | Admitting: Surgery

## 2022-06-22 ENCOUNTER — Ambulatory Visit: Payer: Self-pay | Admitting: Surgery

## 2022-06-22 ENCOUNTER — Inpatient Hospital Stay: Admission: RE | Admit: 2022-06-22 | Payer: Self-pay | Source: Ambulatory Visit

## 2022-11-23 NOTE — Progress Notes (Addendum)
 Subjective      NAME: Robert Valdez MRN: [id number]    DOB: 1976-04-28 AGE: 47 y.o. SEX: male   Chief Complaint: Chief Complaint   Patient presents with   . Follow-up      History of Present Illness  47 year old male presents today for routine follow-up.  Patient states that since his last visit he feels like he really has not made any progress with his neuropathy.  He states that he still continues to have significant symptoms.  He states that he is taking the duloxetine but he is not sure how much it is really helping.  He states that he missed his follow-up appointment here as well as with the spine specialist due to his mom having a stroke.  He states that he has rescheduled his appointment with the spine specialist for next week.  He is hopeful to get some imaging ordered as he has completed physical therapy.  He does not feel like the physical therapy did much of anything, he feels like it may have actually made things worse.    He does mention some concerns today with his blood pressure medication.  Patient states that he has been noticing some urinary frequency especially at night when he is trying to sleep.  He states that he thought it might be a side effect from his medication so he actually stopped taking them for a while to see if it would help.  He has not noticed any change in his urinary symptoms.  He denies any headaches, no chest pain.  He states that he knows he needs to be better with taking his blood pressure medication more routinely.    He also mentions some concerns today with erectile dysfunction.  Patient states that he has recently gotten into a new relationship and states that he is struggling to both achieve as well as maintain an erection.  He states that a friend gave him some sildenafil to try and he wanted to make sure it was safe before he tried it.          Review of Systems  Review of Systems   Constitutional:  Negative for fever and malaise/fatigue.   Eyes:  Negative for blurred  vision.   Respiratory:  Negative for cough and shortness of breath.    Cardiovascular:  Negative for chest pain, palpitations and leg swelling.   Gastrointestinal:  Negative for abdominal pain.   Genitourinary:  Negative for dysuria and urgency.   Musculoskeletal:  Positive for back pain. Negative for falls, joint pain, myalgias and neck pain.   Neurological:  Positive for tingling. Negative for dizziness, focal weakness, weakness and headaches.   Endo/Heme/Allergies:  Negative for polydipsia.          Tobacco History: Patient  reports that he has been smoking cigarettes. He has a 1.50 pack-year smoking history. He does not have any smokeless tobacco history on file.   []  Smoking cessation discussed (Please indicate in Vitals or Social History section)  []  Smoking checklist completed (see Care Plan)     Past Medical History:   Diagnosis Date   . Acute kidney failure    . Diverticulitis    . Hypertension    . Personal history of diseases of skin and subcutaneous tissue     Converted from [employer]   . Renal failure        Problem List   Patient Active Problem List   Diagnosis   . Acute renal failure   .  Abdominal pain   . Hypertension   . Renal insufficiency   . Major depressive disorder   . D (diarrhea)   . Biliary calculi   . BP (high blood pressure)   . Headache   . Vitamin D deficiency   . Aortic dissection, thoracoabdominal   . Lumbosacral radiculopathy at L5   . Disc disease, degenerative, lumbar or lumbosacral         Medications and Allergies   Outpatient Medications Prior to Visit   Medication Sig Dispense Refill   . amLODIPine  10 MG Oral tablet Take 1 tablet by mouth daily. 90 tablet 1   . DULoxetine 30 MG Oral DR capsule Take 1 capsule by mouth daily. 30 capsule 1   . EPINEPHrine 0.3 mg/0.3 mL Inj injection Inject 0.3 mLs into the muscle as needed for Anaphylaxis. 2 each 1   . ergocalciferol (VITAMIN D2) 1,250 mcg (50,000 unit) Oral capsule Take 1 capsule by mouth once a week. 4 capsule 2   .  hydroCHLOROthiazide 25 MG Oral tablet Take 1 tablet by mouth daily. 90 tablet 1   . labetaloL  300 MG Oral tablet Take 1 tablet by mouth 2 (two) times daily. 180 tablet 1   . lisinopriL  40 MG Oral tablet Take 1 tablet by mouth daily. 90 tablet 1   . predniSONE 20 MG Oral tablet Take 3 tablets by mouth daily. For 3 days then 2 tablets daily for 3 days then 1 tablet daily for 3 days 18 tablet 0     No facility-administered medications prior to visit.     Allergies   Allergen Reactions   . Shellfish [Seafood] Itching and Rash       I have reviewed the above medications and allergies       Objective:     Today's vitals: Visit Vitals  BP (!) 156/100 (BP Location: Left arm, Patient Position: Sitting)   Pulse 76   Temp 36.4 C (97.6 F) (Temporal)   Ht 6\' 2"  (1.88 m)   Wt 274 lb (124.3 kg)   SpO2 100%   BMI 35.18 kg/m   Smoking Status Some Days   BSA 2.55 m       Physical Exam  Vitals and nursing note reviewed.   Constitutional:       General: He is not in acute distress.     Appearance: He is well-developed. He is not diaphoretic.   Cardiovascular:      Rate and Rhythm: Normal rate and regular rhythm.      Heart sounds: No murmur heard.     No friction rub. No gallop.   Pulmonary:      Effort: Pulmonary effort is normal. No respiratory distress.      Breath sounds: Normal breath sounds. No wheezing or rales.            Data   Lab Results   Component Value Date    HBA1C 4.9 10/21/2021    LDLC 113 (H) 10/21/2021    TSH 1.08 10/21/2021    GLU 90 10/21/2021    CA 9.5 10/21/2021    NA 142 10/21/2021    K 3.8 10/21/2021    BUN 14 10/21/2021    CREAT 1.0 10/21/2021    AST 35 10/21/2021       Lab Results   Component Value Date    WBCR 7.6 05/15/2013    HCT 50.5 05/15/2013    PLTR 185 05/06/2013     BP Readings from Last  3 Encounters:   11/23/22 (!) 156/100   08/10/22 (!) 145/88   04/18/22 (!) 138/91       Body mass index is 35.18 kg/m.     Wt Readings from Last 3 Encounters:   11/23/22 274 lb (124.3 kg)   08/10/22 250 lb (113.4  kg)   02/25/22 276 lb 3.2 oz (125.3 kg)        I have reviewed the above data         Assessment/Plan       1. Lumbosacral radiculopathy at L5  Support offered  Discussed medication  Increase DULoxetine 60 MG Oral DR capsule; Take 1 capsule by mouth daily.  Dispense: 30 capsule; Refill: 1  Follow-up with pain management as scheduled    2. Peripheral polyneuropathy  Reassurance  Will continue to monitor    3. Erectile dysfunction, unspecified erectile dysfunction type  Discussed possible causes including medication side effects as well as his chronic back pain  Trial with sildenafiL 50 MG Oral tablet; Take 1 tablet by mouth as needed for Erectile Dysfunction.  Dispense: 20 tablet; Refill: 1  Discussed importance of getting his blood pressure down before taking medication and consequences of not getting his blood pressure improved  Side effects discussed    4. Hypertension, unspecified type  Discussed importance of routine follow-up for his blood pressure  Refill:  - amLODIPine  10 MG Oral tablet; Take 1 tablet by mouth daily.  Dispense: 90 tablet; Refill: 1  - hydroCHLOROthiazide 25 MG Oral tablet; Take 1 tablet by mouth daily.  Dispense: 90 tablet; Refill: 1  - labetaloL  300 MG Oral tablet; Take 1 tablet by mouth 2 (two) times daily.  Dispense: 180 tablet; Refill: 1  - lisinopriL  40 MG Oral tablet; Take 1 tablet by mouth daily.  Dispense: 90 tablet; Refill: 1  Update labs as well given urinary symptoms to rule out diabetes  - Basic Metabolic Panel; Future  - Hepatic Function Panel; Future  - Lipid Panel (Calc. LDL); Future  - Vitamin D 25-OH; Future  - Hemoglobin A1C; Future       Follow up  recommended: Return in about 6 weeks (around 01/04/2023).       Reviewed when to call the office, when to follow up and when to go to the ED. Reviewed medications, symptoms of emergency, risks/benefits of treatment plan, symptom managment. Patient verbalizes understanding and agreement of plan.      Karyn Mesiti,  PA-C  Electronically signed on 11/23/2022 at 9:16 AM.

## 2022-12-30 NOTE — Progress Notes (Signed)
 Video Visit  Consent Obtained prior to completing this visit   By proceeding with this visit, you are providing consent for a [facility] provider to treat (the patient) via a telemedicine visit. By providing your consent, you acknowledge:  You will communicate to the [facility] provider using an interactive video link or through telephonic communication. You have the right to discontinue this telemedicine visit at any time.  You will be informed about the [facility] provider conducting the visit and any other personnel who will be at the other end of the transmission.   There are risks associated with a telemedicine visit, including that the information transmitted may not be sufficient because of technological glitches or the lack of hands-on care.   As an alternative, you can schedule an in-person visit, but this may cause a delay in service and would require you to travel to the provider's location.  Medical information shared with the [facility] provider during this visit will be documented and safeguarded just as it would during an in-person visit.   All questions you have regarding [facility] staff or the telemedicine technology will be answered.    Follow up Note:    Patient Identification   Robert Valdez is a(n) 47 y.o. male.  DOB:  1975-09-22    Brief Hx:  This is a patient with depression, prior thoracoabdominal aortic dissection  who presents for evaluation of mixed nociceptive versus neuropathic pain of the low back and bilateral lower extremities.    Interim Hx:    Since he was last seen, he completed lumbar spine MRI on 12/09/2022, notable for disc protrusion at the L5-S1 level with severe bilateral neuroforaminal stenosis.      He continues to have pain in the low back region, bilateral lower extremities, more severe in the right foot, left leg region.  The pain is most severe in the right lower extremity region.  It's difficult to say, perhaps more in the  bilateral lateral extremities.  He describes the pain as sharp, other times dull.  The pain is present constantly.  Aggravating factors include prolonged sitting or standing or walking.  Relieving factors include changing position.  Pain score varies, currently a 9 out of 10.    He continues to endorse intermittent bilateral lower extremity weakness, as well as numbness of the entire right leg, most prominently in the right foot region.  He is now having numbness/tingling of the left leg as well.  No bowel or bladder dysfunction, saddle anesthesia, loss of balance or falls.    Current regimen:  - pregabalin 150 mg BID (PCP): currently taking once a day  - duloxetine 60 mg daily (PCP)     Treatment history:  Analgesics: Acetaminophen , hydrocodone , oxycodone   NSAIDS: Ibuprofen  Neuromodulators: gabapentin , pregabalin     PT/OT: none  Chiropractic: none  Massage: none  Acupuncture: none     Procedural interventions: none  Blood thinners: none     Work Status:  Works for [employer] company at [employer], prolonged standing    Pain Site:   There were no vitals filed for this visit.    Review of Systems   Constitutional:  Negative for fever, general weakness, sweats, anorexia and falls.   Genitourinary:  Negative for bladder incontinence.   Musculoskeletal:  Positive for back pain.   Neurological:  Positive for weakness and numbness. Negative for loss of balance.   Psychiatric/Behavioral:  Negative for nervous/anxious. The patient is not nervous/anxious.  There were no vitals filed for this visit.    Physical Exam  Vitals and nursing note reviewed.   Constitutional:       Appearance: Normal appearance.   HENT:      Head: Normocephalic and atraumatic.   Eyes:      Extraocular Movements: Extraocular movements intact.   Pulmonary:      Effort: Pulmonary effort is normal.   Skin:     General: Skin is warm and dry.   Psychiatric:         Mood and Affect: Mood normal.         Thought Content: Thought content  normal.         Judgment: Judgment normal.         IMAGING:   Lumbar spine MRI from 12/08/2022 and [facility]:  L4-5: Facet arthropathy, LFH, mild central canal stenosis  L5-S1: Mod-severe bilateral neuroforaminal stenosis    EMG from 05/26/22:  This is an abnormal electrodiagnostic study.  There is electrodiagnostic evidence of a chronic right L5 radiculopathy without evidence of any associated active/uncompensated denervation.  There is no electrodiagnostic evidence of a right tibial or peroneal or medial plantar mononeuropathy, or of a generalized large fiber polyneuropathy     Diagnosis Plan   1. Lumbar spondylosis  TFESI (Transforminal Epidural Steroid Injection)    FL OP SURGERY      2. Lumbosacral radiculopathy at L5  TFESI (Transforminal Epidural Steroid Injection)    FL OP SURGERY      3. Disc disease, degenerative, lumbar or lumbosacral  TFESI (Transforminal Epidural Steroid Injection)    FL OP SURGERY            Orders Placed This Encounter   Procedures   . FL OP SURGERY     Standing Status:   Future     Standing Expiration Date:   01/03/2024   . TFESI (Transforminal Epidural Steroid Injection)     Standing Status:   Future     Standing Expiration Date:   01/03/2024     Scheduling Instructions:            Order Specific Question:   Level     Answer:   L5     Order Specific Question:   Location     Answer:   Bilateral       ASSESSMENT: Robert Valdez is a 47 y.o. male who presents for follow-up evaluation of mixed nociceptive versus neuropathic pain of the low back and bilateral lower extremities.  He has a history of a thoracoabdominal aortic dissection status post repair about 2 years ago (per patient).  He has moved back to [place] since then.  His pain originally was the most severe in the right foot region.  He saw the podiatry team and eventually had an EMG that showed a chronic right L5 radiculopathy.  He was referred here for further workup of his lumbar spine region.  A lumbar spine MRI was  ordered by his PCP, but this was denied.     He does not have any lumbar imaging available for review.  Differential includes lumbar DDD versus lumbar spinal stenosis.  He was given a referral to physical therapy today.  He was also on pregabalin 150 mg 2 times a day, which was recently restarted.  He was also encouraged to use acetaminophen  or naproxen as needed.    When Mr. Walling was last seen, he was given a referral to physical therapy.  He has attended 2 sessions, and then was discharged with home exercise program.  He has been doing the home exercise program for 1 hour every day, using a combination of exercises he learned from PT and some of his own exercises.  He also goes to the gym.  He has also seen his PCP, who increased the duloxetine to 60 mg daily.      Since he was last seen, he completed a lumbar spine MRI on 12/08/2022, notable for severe bilateral neuroforaminal stenosis at the L5-S1 level.  He is describing bilateral lower extremity pain.  We did discuss the risks and benefits of procedural interventions, and he would like to proceed.    PLAN:  1. Imaging:  His imaging was reviewed as above today.  Lumbar spine MRI ordered today    2. Medications:  Continue his current medications with his PCP, which includes the duloxetine and pregabalin.    3. Interventions:  Return for a bilateral L5 TFESI.    No blood thinners.  No allergies to contrast.  No recent platelets available for review, but denies bleeding issues.  No diabetes.    4. Activity:  Continue home exercises    No follow-ups on file.     Dr. Seth Daniel, MD   Interventional Pain Physician  Date: 01/03/2023  Time: 1:29 PM

## 2023-03-31 NOTE — Progress Notes (Signed)
 Subjective      NAME: Robert Valdez MRN: [id number]    DOB: Dec 17, 1975 AGE: 47 y.o. SEX: male     Chief Complaint: Chief Complaint   Patient presents with   . Back Pain   . Hypertension   . Benign Prostatic Hypertrophy        Visit History for  03/31/2023    Data   History of Present Illness  Hypertension  This is a chronic problem. The problem is controlled (home BP 120-140 over 90-100). Pertinent negatives include no chest pain, palpitations or shortness of breath. Past treatments include calcium  channel blockers, diuretics, beta blockers and ACE inhibitors. Hypertensive end-organ damage includes PVD. History of aortic dissection.   Benign Prostatic Hypertrophy  This is a chronic problem. The problem has been gradually improving since onset. Irritative symptoms include nocturia. Frequency: 1-2 times.He is sexually active (The daily Cialis is helping with erectile dysfunction as well.). Treatments tried: daily Cialis. The treatment provided moderate relief.   Patient says the daily Cialis has improved the nocturia.    Left flank pain  New pain in left flank over the last 4 weeks.  It bothers him more when laying down at night.  It often keeps him awake.      Chronic lower back pain  Continues to have daily severe lower back pain. Feet very painful and numb especially in the evening and laying in bed at night.  The back and feet are not as painful during the day when he is active.. Nortriptyline helps sleep a little but he still sleeps poorly due to pain.  He says he is tolerating the nortriptyline well without any dizziness or significant dry mouth.  The patient has previously tried gabapentin , Lyrica, and duloxetine without any improvement in his back or neuropathic foot pain.  Patient says he developed these problems after he had his aortic dissection. Lab Results   Component Value Date    HBA1C 5.2 11/23/2022    LDLC 93 11/23/2022    TSH 1.08 10/21/2021    GLU 91 11/23/2022    CA 8.7 11/23/2022    NA 141 11/23/2022     K 3.6 11/23/2022    BUN 13 11/23/2022    CREAT 0.8 11/23/2022    GFR1 >60 05/06/2013    GFRB1 >60 05/06/2013    AST 22 11/23/2022    VD25 27 11/23/2022       Lab Results   Component Value Date    WBCR 7.6 05/15/2013    HCT 50.5 05/15/2013    PLTR 185 05/06/2013        BP Readings from Last 3 Encounters:   03/31/23 (!) 154/108   11/23/22 (!) 156/100   08/10/22 (!) 145/88       Body mass index is 35.59 kg/m.    Wt Readings from Last 3 Encounters:   03/31/23 277 lb 3.2 oz (125.7 kg)   11/23/22 274 lb (124.3 kg)   08/10/22 250 lb (113.4 kg)      Review of Systems   Respiratory:  Negative for shortness of breath.    Cardiovascular:  Negative for chest pain and palpitations.   Genitourinary:  Positive for nocturia. Frequency: 1-2 times.             Tobacco  History:  Patient  reports that he has been smoking cigarettes. He has a 1.5 pack-year smoking history. He has never used smokeless tobacco.  Medications and Allergies   Outpatient Medications Marked as Taking for the 03/31/23 encounter (Office Visit) with Abel Abelson Ramey Burlington, MD   Medication Sig Dispense Refill   . amLODIPine  10 MG Oral tablet Take 1 tablet by mouth daily. 90 tablet 1   . EPINEPHrine 0.3 mg/0.3 mL Inj injection Inject 0.3 mLs into the muscle as needed for Anaphylaxis. 2 each 1   . ergocalciferol (VITAMIN D2) 1,250 mcg (50,000 unit) Oral capsule Take 1 capsule by mouth once a week. 4 capsule 2   . labetaloL  300 MG Oral tablet Take 1 tablet by mouth 2 (two) times daily. 180 tablet 1   . lisinopriL  40 MG Oral tablet Take 1 tablet by mouth daily. 90 tablet 1   . sildenafiL 50 MG Oral tablet Take 1 tablet by mouth as needed for Erectile Dysfunction. 20 tablet 1   . tadalafiL 5 MG Oral tablet Take 1 tablet by mouth every evening. 30 tablet 3   . [DISCONTINUED] hydroCHLOROthiazide 25 MG Oral tablet Take 1 tablet by mouth daily. 90 tablet 1   . [DISCONTINUED] nortriptyline 10 MG Oral capsule Take 3 capsules by mouth at bedtime. 90 capsule 1      Allergies   Allergen Reactions   . Shellfish [Seafood] Itching and Rash       I have reviewed the above medications and allergies       Objective:     Today's vitals:  height is 6\' 2"  (1.88 m) and weight is 277 lb 3.2 oz (125.7 kg). His temporal temperature is 35.8 C (96.4 F). His blood pressure is 154/108 (abnormal) and his pulse is 79. His oxygen saturation is 98%.      Physical Exam  Vitals reviewed.   Constitutional:       Appearance: Normal appearance. He is well-developed.   Cardiovascular:      Rate and Rhythm: Normal rate and regular rhythm.      Heart sounds: No murmur heard.  Pulmonary:      Effort: Pulmonary effort is normal.      Breath sounds: Normal breath sounds. No wheezing or rales.   Abdominal:      General: Abdomen is protuberant.      Palpations: There is no mass.      Tenderness: There is abdominal tenderness in the left upper quadrant. There is left CVA tenderness. There is no right CVA tenderness.   Musculoskeletal:      Lumbar back: Tenderness (over lower lumbar spine) present. Decreased range of motion.      Right lower leg: No edema.      Left lower leg: No edema.   Neurological:      Mental Status: He is alert.              Assessment/Plan       1. Primary hypertension  Suboptimal control.  Continue amlodipine , labetalol , and lisinopril .  Discontinue the hydrochlorothiazide and start chlorthalidone  25 mg daily in its place.  Check metabolic 8 panel 2 weeks after starting the chlorthalidone .  - chlorthalidone  25 MG Oral tablet; Take 1 tablet by mouth daily.  Dispense: 90 tablet; Refill: 1  - Comprehensive Metabolic Panel; Future  - Basic Metabolic Panel; Future    2. Peripheral polyneuropathy  Suboptimal control.  Increase nortriptyline up to 50 mg at bedtime.  We may need to increase him up to 75 mg eventually.  Start Zostrix cream with topical applications twice daily.  Wear rubber gloves when applying.  - capsaicin (  ZOSTRIX-HP) 0.1 % Top topical cream; Apply  topically 3 (three) times  daily.  - nortriptyline 50 MG Oral capsule; Take 1 capsule by mouth at bedtime.  Dispense: 30 capsule; Refill: 4  - Comprehensive Metabolic Panel; Future    3. BPH associated with nocturia  Improved.  Continue Cialis 5 mg daily.  - PSA, Screening; Future    4. Left flank pain  Uncertain etiology.  Check CBC urinalysis and comprehensive metabolic panel.  Check abdominal sonogram to look for any kidney stones or hydronephrosis.  - CBC; Future  - Urinalysis W Reflex To Micro And Cul; Future  - US  abdomen complete; Future                          Follow up  recommended: Return in about 4 weeks (around 04/28/2023) for Telemedicine visit.     Patient verbalized understanding of the above instructions.     Emily Harding, MD  Electronically signed on 03/31/2023 at 8:56 AM.

## 2023-06-08 ENCOUNTER — Encounter: Payer: Self-pay | Admitting: Gastroenterology

## 2023-11-08 NOTE — Progress Notes (Signed)
 Subjective      NAME: Robert Valdez MRN: [id number]    DOB: [birth year] AGE: 48 y.o. SEX: male     Chief Complaint: Chief Complaint   Patient presents with   . Back Pain     Pt reports ongoing left sided back pain that has become progressively worse. Pt states he is having trouble sleeping and can't sit for a long period   . Hypertension        Visit History for  11/08/2023    Data   History of Present Illness  HPI   Chronic back pain  Patient reports that his chronic back pain has gotten much worse over past 5 days.  It bothers him in his lower back and his left flank.  Pain is better when he is upright.  Worse when he is laying down supine.  Can't sleep due to the pain. Ran out of all his medications meds last week.  He reports the Cialis works significantly for his urinary frequency.  He says he was sleeping better with taking the nortriptyline and Cialis.  He has an appointment next week to see a neurologist regarding his chronic back pain and neuropathic symptoms.    Hypertension  Patient has a history of aortic dissection and severe hypertension.  He ran out of all his blood pressure medications last week.  As noted above he is complaining of worsening back pain.  He denies abdominal pain.  No chest pain or shortness of breath.    Nocturia  Patient was started on Cialis 5 mg daily 6 months ago for symptoms of urinary frequency and nocturia.  He reports significant improvement in his symptoms since doing so.  Patient was also supposed to get lab work to check a PSA as well as an abdominal sonogram but did not do so.  The patient is here with his significant other today. Lab Results   Component Value Date    HBA1C 5.2 11/23/2022    LDLC 93 11/23/2022    TSH 1.08 10/21/2021    GLU 91 11/23/2022    CA 8.7 11/23/2022    NA 141 11/23/2022    K 3.6 11/23/2022    BUN 13 11/23/2022    CREAT 0.8 11/23/2022    GFR1 >60 05/06/2013    GFRB1 >60 05/06/2013    AST 22 11/23/2022    VD25 27 11/23/2022       Lab Results   Component  Value Date    WBCR 7.6 05/15/2013    HCT 50.5 05/15/2013    PLTR 185 05/06/2013        BP Readings from Last 3 Encounters:   11/08/23 (!) 235/140   03/31/23 (!) 154/108   11/23/22 (!) 156/100       Body mass index is 37.64 kg/m.    Wt Readings from Last 3 Encounters:   11/08/23 293 lb 3.2 oz (133 kg)   03/31/23 277 lb 3.2 oz (125.7 kg)   11/23/22 274 lb (124.3 kg)      Review of Systems           Tobacco  History:  Patient  reports that he has been smoking cigarettes. He has a 1.5 pack-year smoking history. He has never used smokeless tobacco.                 Medications and Allergies   Outpatient Medications Marked as Taking for the 11/08/23 encounter (Office Visit) with Abel Abelson Ramey Burlington, MD   Medication Sig  Dispense Refill   . capsaicin (ZOSTRIX-HP) 0.1 % Top topical cream Apply  topically 3 (three) times daily.     Aaron Aas EPINEPHrine 0.3 mg/0.3 mL Inj injection Inject 0.3 mLs into the muscle as needed for Anaphylaxis. 2 each 1   . ergocalciferol (VITAMIN D2) 1,250 mcg (50,000 unit) Oral capsule Take 1 capsule by mouth once a week. 4 capsule 2   . sildenafiL 50 MG Oral tablet Take 1 tablet by mouth as needed for Erectile Dysfunction. 20 tablet 1   . [DISCONTINUED] amLODIPine  10 MG Oral tablet Take 1 tablet by mouth daily. 90 tablet 1   . [DISCONTINUED] chlorthalidone  25 MG Oral tablet Take 1 tablet by mouth daily. 90 tablet 1   . [DISCONTINUED] labetaloL  300 MG Oral tablet Take 1 tablet by mouth 2 (two) times daily. 180 tablet 1   . [DISCONTINUED] lisinopriL  40 MG Oral tablet Take 1 tablet by mouth daily. 90 tablet 1   . [DISCONTINUED] nortriptyline 50 MG Oral capsule Take 1 capsule by mouth at bedtime. 90 capsule 1   . [DISCONTINUED] tadalafiL 5 MG Oral tablet TAKE 1 TABLET BY MOUTH EVERY EVENING 30 tablet 5     Allergies   Allergen Reactions   . Shellfish [Seafood] Itching and Rash       I have reviewed the above medications and allergies       Objective:     Today's vitals:  weight is 293 lb 3.2 oz (133 kg).  His temporal temperature is 36.9 C (98.4 F). His blood pressure is 235/140 (abnormal) and his pulse is 83. His oxygen saturation is 97%.      Physical Exam  Vitals reviewed.   Constitutional:       Appearance: Normal appearance. He is well-developed. He is obese.   Cardiovascular:      Rate and Rhythm: Normal rate and regular rhythm.      Heart sounds: No murmur heard.  Pulmonary:      Effort: Pulmonary effort is normal.      Breath sounds: Normal breath sounds. No wheezing or rales.   Abdominal:      General: Abdomen is protuberant.      Palpations: Abdomen is soft. There is no mass.      Tenderness: There is no abdominal tenderness. There is left CVA tenderness.   Musculoskeletal:      Right lower leg: No edema.      Left lower leg: No edema.   Neurological:      Mental Status: He is alert.              Assessment/Plan       1. Accelerated hypertension  Patient with significantly elevated blood pressure today running out of his antihypertensive medications.  Because the blood pressure is so significantly elevated I instructed him to go to the [facility] emergency department for further evaluation and treatment.    2. Left flank pain  Rule out BPH with urinary retention.  Patient complaining of worsening back and left flank pain since running out of daily Cialis last week.  He would benefit from an abdominal sonogram to assess his kidneys and rule out hydronephrosis.    3. BPH associated with nocturia  Resume daily Cialis.  - tadalafiL 5 MG Oral tablet; Take 1 tablet by mouth every evening.  Dispense: 90 tablet; Refill: 1    4. Peripheral polyneuropathy  Resume nortriptyline.  Keep upcoming appointment with neurology.  - nortriptyline 50 MG Oral capsule; Take 1  capsule by mouth at bedtime.  Dispense: 90 capsule; Refill: 1    5. Hypertension, unspecified type  Resume antihypertensive medications after being released from the hospital.  - amLODIPine  10 MG Oral tablet; Take 1 tablet by mouth daily.  Dispense: 90 tablet;  Refill: 1  - labetaloL  300 MG Oral tablet; Take 1 tablet by mouth 2 (two) times daily.  Dispense: 180 tablet; Refill: 1  - lisinopriL  40 MG Oral tablet; Take 1 tablet by mouth daily.  Dispense: 90 tablet; Refill: 1  - chlorthalidone  25 MG Oral tablet; Take 1 tablet by mouth daily.  Dispense: 90 tablet; Refill: 1                          Follow up  recommended: Return in about 3 weeks (around 11/29/2023) for In office.     Patient verbalized understanding of the above instructions.     Emily Harding, MD  Electronically signed on 11/08/2023 at 1:47 PM.

## 2023-11-24 LAB — UNMAPPED LAB RESULTS
ABO RH Blood Type (HT): O POS
Antibody Screen (HT): NEGATIVE
Basophil # (HT): 0 10 3/uL (ref 0.0–0.2)
Basophil % (HT): 0 % (ref 0–2)
Eosinophil # (HT): 0.2 10 3/uL (ref 0.0–0.5)
Eosinophil % (HT): 2 % (ref 0–7)
Hematocrit (HT): 49 % (ref 40–52)
Hematocrit (HT): 48 % (ref 40–52)
Hemoglobin (HGB) (HT): 17.6 g/dL — ABNORMAL HIGH (ref 13.0–17.5)
Hemoglobin (HGB) (HT): 16.7 g/dL (ref 13.0–18.0)
Lymphocyte # (HT): 2.3 10 3/uL (ref 0.9–3.8)
Lymphocyte % (HT): 26 % (ref 17–44)
MCHC (HT): 36.3 g/dL — ABNORMAL HIGH (ref 32.0–36.0)
MCHC (HT): 34.9 g/dL (ref 32.0–37.5)
MCV (HT): 88 fL (ref 81–99)
MCV (HT): 89 fL (ref 80–100)
Mean Corpuscular Hemoglobin (MCH) (HT): 32.1 pg (ref 26.0–34.0)
Mean Corpuscular Hemoglobin (MCH) (HT): 31.2 pg (ref 26.0–34.0)
Monocyte # (HT): 0.6 10 3/uL (ref 0.2–1.0)
Monocyte % (HT): 7 % (ref 4–15)
Neutrophil # (HT): 5.7 10 3/uL (ref 1.5–7.7)
Platelets (HT): 183 10 3/uL (ref 150–450)
Platelets (HT): 181 10 3/uL (ref 150–450)
RBC (HT): 5.49 10 6/uL (ref 4.60–6.20)
RBC (HT): 5.36 10 6/uL (ref 4.40–6.20)
RDW (HT): 12.8 % (ref 11.5–15.0)
RDW (HT): 13 % (ref 0.0–15.2)
Seg Neut % (HT): 64 % (ref 43–75)
WBC (HT): 8.9 10 3/uL (ref 4.0–10.0)
WBC (HT): 9.3 10 3/uL (ref 4.0–11.0)

## 2023-11-24 NOTE — ED Provider Notes (Addendum)
 [facility] EMERGENCY DEPT  History   HPI   48 year old male history of hypertension, prior type a dissection status post surgical repair about 3 years ago, L5 radiculopathy, degenerative disc disease presents to ER complaining of intermittent back pain, abdominal pain over the past several days.  He states that the pain seemed to worsen overnight prompting him to come into the ER.  He states the pain is made located on the left side of his back but does occasionally wraparound toward his abdomen.  He is also complaining of some right upper quadrant abdominal pain.  He is occasionally nauseous but not vomiting.  Denies any diarrhea melena or bloody stools.  He denies any overt chest pain at this time.  He states that he has tried lidocaine  patches and gabapentin  at home to help with the symptoms but to no avail.  He has previously required cortisone shots for degenerative disc disease.  He has not seen a spine specialist recently.  He denies any palpitations.  He denies any other recent illness.           Patient Active Problem List   Diagnosis   . Acute renal failure (CMS HCC Code)   . Abdominal pain   . Hypertension   . Renal insufficiency   . Major depressive disorder   . D (diarrhea)   . Biliary calculi   . BP (high blood pressure)   . Headache   . Vitamin D deficiency   . Aortic dissection, thoracoabdominal (CMS HCC Code)   . Lumbosacral radiculopathy at L5   . Disc disease, degenerative, lumbar or lumbosacral     Past Medical History:   Diagnosis Date   . Acute kidney failure (CMS HCC Code)    . Diverticulitis    . Hypertension    . Personal history of diseases of skin and subcutaneous tissue     Converted from [employer]   . Renal failure      Past Surgical History:   Procedure Laterality Date   . CARDIAC SURGERY     . CHOLECYSTECTOMY     . CONVERTED FROM [facility] PROCEDURE HISTORY      Excision of pilonidal cyst or sinus   . CONVERTED FROM [facility] PROCEDURE HISTORY      S/P wrist surgery   . right wrist surgery for torn  tendon       Family History   Problem Relation Name Age of Onset   . Hypertension Father     . Hypertension Mother     . Hypertension Father     . Hypertension Mother     . Hypertension Mother          Converted from [employer]   . Hypertension Father          Converted from [employer]     Social History     Tobacco Use   . Smoking status: Some Days     Current packs/day: 0.30     Average packs/day: 0.3 packs/day for 5.0 years (1.5 ttl pk-yrs)     Types: Cigarettes   . Smokeless tobacco: Never   Substance Use Topics   . Alcohol use: Yes     Comment: occassionally   . Drug use: No       Physical Exam     Vitals:    11/24/23 0846 11/24/23 0848   BP: 138/76    Pulse: (!) 110    Resp: 18    Temp: 35.8 C (96.4 F)  TempSrc: Temporal    SpO2: 97%    Weight:  131.5 kg (290 lb)   Height:  1.88 m (6' 2)       Physical Exam  Constitutional:       Appearance: Normal appearance.   HENT:      Head: Normocephalic and atraumatic.      Right Ear: External ear normal.      Left Ear: External ear normal.      Nose: Nose normal.      Mouth/Throat:      Mouth: Mucous membranes are moist.      Pharynx: Oropharynx is clear.   Eyes:      Extraocular Movements: Extraocular movements intact.      Pupils: Pupils are equal, round, and reactive to light.   Cardiovascular:      Rate and Rhythm: Normal rate and regular rhythm.      Pulses: Normal pulses.   Pulmonary:      Effort: Pulmonary effort is normal.   Abdominal:      General: Abdomen is flat.      Palpations: Abdomen is soft.      Comments: Right upper quadrant tenderness to palpation, mild left upper quadrant tenderness palpation no rebound or guarding.  No other lower abdominal tenderness to palpation.  There is mild distention throughout the abdomen.   Musculoskeletal:         General: Normal range of motion.      Cervical back: Neck supple.      Comments: Mild midline thoracic spine tenderness palpation no step-offs no deformities.  Mild left para thoracic spine tenderness to palpation.    Skin:     General: Skin is warm.      Capillary Refill: Capillary refill takes less than 2 seconds.   Neurological:      General: No focal deficit present.      Mental Status: He is alert and oriented to person, place, and time.      Comments: CN II-XII intact  Strength 5/5 b/l upper and lower extremities  Sensation intact b/l upper and lower extremities          ED Procedures   --------------------  EKG    Date/Time: 11/24/2023 11:06 AM    Performed by: Juanita Rue, MD  Authorized by: Juanita Rue, MD  EKG reviewed: EKG reviewed and personally interpreted by me  Cardiac rate (bpm): 100 bpm  EKG rhythm: sinus rhythm  Axis: normal  PR interval: normal PR interval  QRS interval: normal QRS interval  QT interval: normal QT interval  ST segment: ST segments normal  T wave:non-specific ST-T wave changes  Clinical impression: Normal sinus rhythm, nonspecific st/t changes  --------------------      MDM   ED Course & MDM     Differential Diagnosis:      Musculoskeletal back pain, intra-abdominal process not limited to cholecystitis, cholelithiasis, issue with patient's previously repaired type a dissection among other possible causes of the patient symptoms.  Patient is high risk, will obtain a CTA chest abdomen pelvis.  Will check labs EKG.  IV medicine for symptom management. Disposition pending clinical reassessment and results.   Impression and Plan:      1239: Notified by CT scan technician that patient has a large dissection on imaging, I reviewed the imaging myself.  He does appear to be a type a dissection evolving into a type B dissection.  I have emergently consulted thoracic surgery.  I moved patient from infusion  center where he was located to monitored bed, his repeat vital signs reveal that he is hypertensive and a bit tachycardic.  I am initiating esmolol  drip.     1344: I spoke with Dr. Janit of cardiac surgery.  He reviewed prior notes from [facility] medical system that have documented that patient did have a  type A aortic dissection repair and does have history of acute type a aortic dissection Originating at the the sinotubular junction and extending to the left common iliac artery.  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  He underwent emergent surgery for type a aortic dissection repair with a 30 mm Hemashield platinum vascular graft.  He also underwent a intraprocedural transesophageal echo which showed severe left ventricular hypertrophy with normal systolic function, normal right ventricular function with trace central aortic insufficiency but otherwise normal-appearing aortic valve.  This is found from prior report that was written on [facility] record.  There is no images to accompany this report.  Patient has not undergone any additional imaging while in [place].  He does not follow with a vascular surgeon or cardiothoracic physician here.  He he has been presenting with worsening left flank pain and was advised by his primary physician to report to the ER about a week ago but never reported.  Dr. Janit does not believe that there is an acute role for cardiac surgery at this time.  I have consulted vascular surgery at this time at [facility] though they are in surgery. I also consulted Cardiology fellow. I have initiated nicardipine gtt given the patient's continued hypertension despite being on Esmolol .     1430: Clear surgery reviewed case.  I did speak to [facility] who did speak to Dr. Lander regarding the case.  He states patient does need to be transferred to [facility] for continued monitoring as it is unclear if this is an acute versus chronic aortic dissection.  Patient is stable on esmolol  drip and labetalol .  I have recontacted transfer center for transfer.    1519: Patient is excepted by Dr. Lander to [facility] surgical ICU.  I have initiated emergent ALS transfer.  There were delays in initiating the transfer due to needing to consult  multiple surgeons, the fact that this patient has a complicated past medical history and it is unclear if this is an acute versus chronic aortic dissection.  [facility] has been contacted and they will emergently take the patient over to the [facility] ICU.  Patient is stable at this time.  CT scan abdomen pelvis was concerning for ileus versus SBO, I do not believe that the patient has a small bowel obstruction at this time, he is not nauseous or vomiting and has still been having bowel movements.  This is more likely an ileus.  This can be managed by surgical ICU at [facility]    Clinical Impressions as of 11/24/23 1525   Dissection of thoracoabdominal aorta (CMS HCC Code)   Left flank pain   Right sided abdominal pain   Ileus (CMS HCC Code)       Reassessment:      Patient was reassessed just prior to disposition and disposition planning and test results were reviewed     Stable on nicardipine gtt and esmolol  gtt.  Disposition Decision:      Transfer    Discussion with Independent Historian:      Relative  Review of External Record:  Inpatient record, office record, outpatient record and outpatient labs  Discussion with Other Healthcare Providers:      Consultant     Discussion:  Cardiac Surgery Dr. Janit  Vascular Surgery Dr. Lander  Medication Management:      Prescription medications were administered, prescribed, or evaluated during the ED visit  Independent Interpretation:      Cardiac:  EKG     Cardiac studies as interpreted by me:  Sinus rhythm, nonspecific st/t changes   Chronic Conditions Affecting Care:      HTN and other (Type A Dissection, Lumbosacral radiculopathy)    Critical Care Time:      Critical care time only includes direct patient care, consultation, and appropriate family consultation.  It does not include time spent performing procedures.     Time (minutes):  180     Justification/Intervention: hemodynamic support, frequent examinations,  vasoactive IV medications, life-threatening condition and limb-threatening condition                 Attestation   Randell Juanita Rue, MD  11/24/23 1524       Juanita Rue, MD  11/24/23 1525

## 2023-11-24 NOTE — H&P (Signed)
 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   Aforementioned was performed in [place]   Now relocated to [place]  No surveillance imaging since 2022  Presents with abdominal pain and back pain  CTA torso with dissection extending from zone 0-11  No obvious malperfusion clinically or radiographically  Several high risk features noted  Prior imaging obtained, including CTA from 2022  Overall, most recent CTA with no dramatic change from 2022  False lumen and total aortic diameter slightly increased  High risk for long term aneurysmal degeneration      At this juncture, would opt for strict blood pressure control and monitoring for resolution of symptoms.  If symptoms do not improve despite blood pressure control, patient would have two options, namely - total endo-arch vs re-do sternotomy with arch repair and elephant trunk.    -------------------------------------------------------------------------------    Vascular Surgery H&P    History of Present Illness: Robert Valdez is a pleasant 48 y.o. male who has a past medical history of acute kidney injury, diverticulitis, hypertension.  He has a history of prior type a dissection on 04/22/2020 after experiencing chest pain, back pain and left arm and leg numbness.  He noted to have severe systemic hypertension in the ED, CTA showed an acute type aortic dissection that originated at the level of the sinotubular junction and extended to the left common iliac artery.  The dissection flap also entered the superior mesenteric artery with partial occlusion with distal reconstitution.  Status post type a aortic dissection repair with 30 mm Hemashield platinum vascular graft by cardiothoracic surgery.  This was done in [facility] [place]  [facility]. He moved to [place]  about 3 years  ago so they can help with his care after surgery.      Patient relates that he has had back pain radiating to his right flank worsening over the past month.  He has baseline leg and back pain since his surgery.  He relates that he had local injections done by a neurosurgeon approximately a month to a month and a half ago with worsening of pain since.  He also elicits abdominal pain at this time.  He states that his pain became acutely worse overnight prompting him to come to the ED.  He has had no nausea or vomiting.  Denies any diarrhea, melena or bloody stools.  Denies chest pain.  He relates that has not had any imaging since moving to [place], denies any history of CTA since being established with strong cardiology as well as vascular team.     ED workup significant for hypertension with a systolic as high as 198/128, noted to be around 160 systolic over 90 at time of my exam.  He had a CTA type a dissection extending from near the origin of the brachiocephalic artery through the left femoral artery.  The aorta is aneurysmal at the descending thoracic aorta 4 cm and 3.3 cm in the abdomen.  The brachiocephalic artery and the left renal artery appear to be supplied by the false lumen with questionable minimal relative decreased perfusion to the left kidney.    Currently pt reports 10/10 abd pain. Discussed pt with Dr. Geroldine.      Past Medical History:   Diagnosis Date   . Acute kidney failure (CMS HCC Code)    . Diverticulitis    . Hypertension    .  Personal history of diseases of skin and subcutaneous tissue     Converted from [employer]   . Renal failure      Past Surgical History:   Procedure Laterality Date   . CARDIAC SURGERY     . CHOLECYSTECTOMY     . CONVERTED FROM CERNER PROCEDURE HISTORY      Excision of pilonidal cyst or sinus   . CONVERTED FROM CERNER PROCEDURE HISTORY      S/P wrist surgery   . right wrist surgery for torn tendon       Prior to Admission medications    Medication Sig Start Date End  Date Taking? Authorizing Provider   amLODIPine  10 MG Oral tablet Take 1 tablet by mouth daily. 11/08/23   Loring Belvie Rush, MD   capsaicin (ZOSTRIX-HP) 0.1 % Top topical cream Apply  topically 3 (three) times daily. 03/31/23   Loring Belvie Rush, MD   chlorthalidone  25 MG Oral tablet Take 1 tablet by mouth daily. 11/08/23   Loring Belvie Rush, MD   EPINEPHrine 0.3 mg/0.3 mL Inj injection Inject 0.3 mLs into the muscle as needed for Anaphylaxis. 02/25/22   Patrawala, Sara Sugra, MD   ergocalciferol (VITAMIN D2) 1,250 mcg (50,000 unit) Oral capsule Take 1 capsule by mouth once a week. 07/04/22   Loring Belvie Rush, MD   labetaloL  300 MG Oral tablet Take 1 tablet by mouth 2 (two) times daily. 11/08/23   Loring Belvie Rush, MD   lisinopriL  40 MG Oral tablet Take 1 tablet by mouth daily. 11/08/23   Loring Belvie Rush, MD   nortriptyline  50 MG Oral capsule Take 1 capsule by mouth at bedtime. 11/08/23   Loring Belvie Rush, MD   sildenafiL 50 MG Oral tablet Take 1 tablet by mouth as needed for Erectile Dysfunction. 11/23/22   Mesiti, Karyn, PA-C   tadalafiL  5 MG Oral tablet Take 1 tablet by mouth every evening. 11/08/23   Loring Belvie Rush, MD     Allergies   Allergen Reactions   . Shellfish [Seafood] Itching and Rash     Social History     Socioeconomic History   . Marital status: Divorced     Spouse name: Not on file   . Number of children: Not on file   . Years of education: Not on file   . Highest education level: Not on file   Occupational History   . Not on file   Tobacco Use   . Smoking status: Some Days     Current packs/day: 0.30     Average packs/day: 0.3 packs/day for 5.0 years (1.5 ttl pk-yrs)     Types: Cigarettes   . Smokeless tobacco: Never   Vaping Use   . Vaping status: Not on file   Substance and Sexual Activity   . Alcohol use: Yes     Comment: occassionally   . Drug use: No   . Sexual activity: Not Currently   Other Topics Concern   . Not on file   Social History Narrative    Tobacco  ldz:Rlmmzwu every day smoker;    Tobacco type:Cigarettes;    Tobacco number of years:19;    Tobacco total pack years:19;    Tobacco started at age:27;    Tobacco readiness to change:Yes;    Alcohol ldz:Rlmmzwu;    Alcohol frequency:1-2 times per month;    Sexually active:Yes;    Number of current partners:1;    Sexual orientation:Heterosexual;    Lives with:Significant other;  Living situation:Home/Independent;    Employment/School:Not employed or in school;    Tobacco:;Patient used to smoke about 1 pack per day from age 43 until 6 months ago. He cut down because his girlfriend of 7 months is a past smoker and is encouraging him to quit for the past 6 months. Now he is down to 1 pack a week and desires to quit completely. - 02/04/2011 11:09:50 EDT - Jolee MD, Alm    Sexual:;Girlfriend of 7 months just started mirena intrauterine device and was tested for STDs and was found to be negative. Since placement of IUD they no longer use condoms. - 02/04/2011 11:11:51 EDT - Jolee MD, Alm     Social Drivers of Health     Financial Resource Strain: Not on file   Food Insecurity: Not on file   Transportation Needs: Not on file   Physical Activity: Not on file   Stress: Not on file   Social Connections: Not on file   Intimate Partner Violence: Unknown (12/12/2021)    [facility]    Intimate Partner Violence    . Fear of Current or Ex-Partner: Not on file   Housing Stability: Not on file     Family History   Problem Relation Name Age of Onset   . Hypertension Father     . Hypertension Mother     . Hypertension Father     . Hypertension Mother     . Hypertension Mother          Converted from [employer]   . Hypertension Father          Converted from [employer]       Review of systems:   As stated in HPI      Physical Examination:  Vitals:    11/24/23 1700   BP: 100/59   Pulse: 83   Resp: 24     No intake/output data recorded.  No intake/output data recorded.    Gen: appears comfortable but in pain,  resting in bed  Card: RRR  Lungs: non labored breahting  Abd: soft, obese, tender when palpated, bowel sounds present.   Ext: faint femoral pulses palpated. Motor and sensation intact. DP/PT/AT signals present. Motor and sensation intact.       Lab Results:  Lab Results   Component Value Date    WBCR 8.9 11/24/2023    HGB 17.6 (H) 11/24/2023    HCT 49 11/24/2023    MCV 88 11/24/2023    PLTR 183 11/24/2023     Lab Results   Component Value Date    GLU 129 (H) 11/24/2023    CA 9.8 11/24/2023    NA 138 11/24/2023    K 3.3 (L) 11/24/2023    CL 104 11/24/2023    BUN 17 11/24/2023    CREAT 0.8 11/24/2023     CTA     Impression:   1.  Type A aortic dissection extending from near the origin of the brachiocephalic artery through the left femoral artery. The aorta is aneurysmal at the descending thoracic aorta to 4.0 cm and 3.3 cm in the abdomen. The brachiocephalic artery and left  renal artery appeared to be supplied by the false lumen with questionable minimal relative decreased perfusion of the left kidney. Remainder of the major branches are supplied by the true lumen.  2.  Borderline dilated loop of small bowel within the left hemiabdomen without discrete transition point. Possibly representing an ileus, mild or intermittent  obstruction not entirely excluded    Assessment: 48 y.o. Male with a history of aortic type a dissection, abdominal and back pain x 1 month, worsened overnight. Discussed with Dr. Geroldine    Plan:   - VSS, afebrile, SBP goal 110-120, On Cardene  - Perfusion to extremities intact  - OR: Planning for OR on Monday for dissection repair. Working on obtaining CT scans from previous aneurysm repair.  - After several phone calls, I found out that [facility] is linked with [facility] through [employer] but not [facility]. Due to the urgent circumstances [facility] has agreed to  send 2021 and 2022 CTA dissection images to [facility] and our radiology team will pick those up from [facility] and upload the scans into PACS.  That process should be completed tonight. Discussed doing this with the pt and his family and they agreed to let us  do this.  - Labs: Trend LFT's, Lactate, BMP, and pancreatic enzymes every 6 hours  - Imaging: CTA noted above  - Pain: PRN analgesia  - Respiratory: Encourage IS use  - Diet: Okay for clear liquid diet  - Activity: Bed rest  - Continue CMS checks  - Appreciate SICU care    Remona Wolm Broaden, PA-C  11/24/23  5:13 PM  Call [phone] or contact through [employer] M-F 6a-6p   Outside of these hours please page the Surgical Hospitalist

## 2023-11-24 NOTE — Consults (Signed)
 -------------------------------------------------------------------------------  Attestation signed by Geroldine Penne Dunnings, MD at 11/25/2023  6:57 AM  See [facility] consult note for details  -------------------------------------------------------------------------------    SURGICAL CONSULT NOTE    Patient Information:  Robert Valdez     MRN: [id number]  48 y.o. Male    DOB: 12/08/1975    Asked by Dr. Juanita to see Anastacio Delude in consultation for back pain with h/o type A dissection.    Source of history: chart review and the patient  Present at bedside: the patient    Chief Complaint: Flank Pain (L)        History of Present Illness: Robert Valdez is a pleasant 48 y.o. male who has a past medical history of acute kidney injury, diverticulitis, hypertension.  He has a history of prior type a dissection on 04/22/2020 after experiencing chest pain, back pain and left arm and leg numbness.  He noted to have severe systemic hypertension in the ED, CTA showed an acute type aortic dissection that originated at the level of the sinotubular junction and extended to the left common iliac artery.  The dissection flap also entered the superior mesenteric artery with partial occlusion with distal reconstitution.  Status post type a aortic dissection repair with 30 mm Hemashield platinum vascular graft by cardiothoracic surgery.  This was done in [facility] [place]  [facility].  He moved to [facility]  about 3 years ago so they can help with his care after surgery.     Patient relates that he has had back pain radiating to his right flank worsening over the past month.  He has baseline leg and back pain since his surgery.  He relates that he had local injections done by a neurosurgeon approximately a month to a month and a half ago with worsening of pain since.  He also elicits abdominal pain at this time.  He states that his pain became acutely worse overnight prompting him to come to the ED.  He has had no  nausea or vomiting.  Denies any diarrhea, melena or bloody stools.  Denies chest pain.  He relates that has not had any imaging since moving to [place], denies any history of CTA since being established with strong cardiology as well as vascular team.    ED workup significant for hypertension with a systolic as high as 198/128, noted to be around 160 systolic over 90 at time of my exam.  He had a CTA type a dissection extending from near the origin of the brachiocephalic artery through the left femoral artery.  The aorta is aneurysmal at the descending thoracic aorta 4 cm and 3.3 cm in the abdomen.  The brachiocephalic artery and the left renal artery appear to be supplied by the false lumen with questionable minimal relative decreased perfusion to the left kidney.    Review Of Systems:  Review of Systems   Gastrointestinal:  Positive for abdominal pain and nausea. Negative for blood in stool, constipation, diarrhea, heartburn, melena and vomiting.   Genitourinary:  Positive for flank pain.       Patient Active Problem List    Diagnosis Date Noted   . Lumbosacral radiculopathy at L5 08/25/2022   . Disc disease, degenerative, lumbar or lumbosacral 08/25/2022   . Aortic dissection, thoracoabdominal (CMS HCC Code) 11/19/2021   . Vitamin D deficiency 11/17/2021   . Headache 12/15/2011   . D (diarrhea) 11/29/2011   . Major depressive disorder 09/02/2011   . Biliary calculi 03/11/2011   .  BP (high blood pressure) 03/11/2011   . Acute renal failure (CMS HCC Code) 02/12/2011   . Abdominal pain 02/12/2011   . Hypertension 02/12/2011   . Renal insufficiency 02/12/2011       Allergies   Allergen Reactions   . Shellfish [Seafood] Itching and Rash       Home Medications:    Current Outpatient Medications   Medication Instructions   . amLODIPine  (NORVASC ) 10 mg, Oral, DAILY   . capsaicin (ZOSTRIX-HP) 0.1 % Top topical cream Topical, 3 TIMES DAILY   . chlorthalidone  (HYGROTON ) 25 mg, Oral, DAILY   . EPINEPHrine (EPIPEN) 0.3 mg,  Intramuscular, PRN   . ergocalciferol (VITAMIN D2) 50,000 Units, Oral, WEEKLY SCHEDULED   . labetaloL  (NORMODYNE ) 300 mg, Oral, 2 TIMES DAILY   . lisinopriL  (PRINIVIL ,ZESTRIL ) 40 mg, Oral, DAILY   . nortriptyline  (PAMELOR ) 50 mg, Oral, BEDTIME   . sildenafiL (VIAGRA) 50 mg, Oral, PRN   . tadalafiL  (CIALIS ) 5 mg, Oral, DAILY         Current Medications:  Scheduled:    Continuous infusions:  . esmolol  50 mcg/kg/min (11/24/23 1355)   . niCARdipine (CARDENE) infusion 5 mg/hr (11/24/23 1415)     PRN:       Past Medical History:   Diagnosis Date   . Acute kidney failure (CMS HCC Code)    . Diverticulitis    . Hypertension    . Personal history of diseases of skin and subcutaneous tissue     Converted from [employer]   . Renal failure         Past Surgical History:   Procedure Laterality Date   . CARDIAC SURGERY     . CHOLECYSTECTOMY     . CONVERTED FROM CERNER PROCEDURE HISTORY      Excision of pilonidal cyst or sinus   . CONVERTED FROM CERNER PROCEDURE HISTORY      S/P wrist surgery   . right wrist surgery for torn tendon         Social History     Socioeconomic History   . Marital status: Divorced     Spouse name: Not on file   . Number of children: Not on file   . Years of education: Not on file   . Highest education level: Not on file   Occupational History   . Not on file   Tobacco Use   . Smoking status: Some Days     Current packs/day: 0.30     Average packs/day: 0.3 packs/day for 5.0 years (1.5 ttl pk-yrs)     Types: Cigarettes   . Smokeless tobacco: Never   Vaping Use   . Vaping status: Not on file   Substance and Sexual Activity   . Alcohol use: Yes     Comment: occassionally   . Drug use: No   . Sexual activity: Not Currently   Other Topics Concern   . Not on file   Social History Narrative    Tobacco ldz:Rlmmzwu every day smoker;    Tobacco type:Cigarettes;    Tobacco number of years:19;    Tobacco total pack years:19;    Tobacco started at age:7;    Tobacco readiness to change:Yes;    Alcohol ldz:Rlmmzwu;     Alcohol frequency:1-2 times per month;    Sexually active:Yes;    Number of current partners:1;    Sexual orientation:Heterosexual;    Lives with:Significant other;    Living situation:Home/Independent;    Employment/School:Not employed or in school;  Tobacco:;Patient used to smoke about 1 pack per day from age 28 until 6 months ago. He cut down because his girlfriend of 7 months is a past smoker and is encouraging him to quit for the past 6 months. Now he is down to 1 pack a week and desires to quit completely. - 02/04/2011 11:09:50 EDT - Jolee MD, Alm    Sexual:;Girlfriend of 7 months just started mirena intrauterine device and was tested for STDs and was found to be negative. Since placement of IUD they no longer use condoms. - 02/04/2011 11:11:51 EDT - Jolee MD, Alm     Social Drivers of Health     Financial Resource Strain: Not on file   Food Insecurity: Not on file   Transportation Needs: Not on file   Physical Activity: Not on file   Stress: Not on file   Social Connections: Not on file   Intimate Partner Violence: Unknown (12/12/2021)    [facility]    Intimate Partner Violence    . Fear of Current or Ex-Partner: Not on file   Housing Stability: Not on file         Family History   Problem Relation Name Age of Onset   . Hypertension Father     . Hypertension Mother     . Hypertension Father     . Hypertension Mother     . Hypertension Mother          Converted from [employer]   . Hypertension Father          Converted from [employer]       Physical Exam:   Vitals:    11/24/23 1400 11/24/23 1410 11/24/23 1415 11/24/23 1420   BP: (!) 160/94 (!) 159/95 (!) 159/95 (!) 145/91   Pulse: 82 84 90 88   Resp: 19 25 20 22    Temp:       TempSrc:       SpO2: 95% 96% 96% 95%   Weight:       Height:           Intake/Output       None            General: No distress, alert and appropriate  Chest: Tachycardic, S1-S2.  Breath sounds bilaterally no with tissue sounds  Abdomen: Distended protuberant  abdomen, diffuse tenderness, no peritoneal signs.  No rebound tenderness.  Hypoactive bowel sounds.  Extremities: Warm, no calf tenderness, no edema, palpable posterior tib, nonpalpable DP.  Able to wiggle toes, dorsi plantarflexion intact.    Laboratory Data:  Recent Labs   Lab 11/24/23  1048   WBCR 8.9   HGB 17.6*   HCT 49   PLTR 183     Recent Labs   Lab 11/24/23  1048   NEUPR 64   EOSPR 2     No results for input(s): INR in the last 168 hours.    Invalid input(s): APT Recent Labs   Lab 11/24/23  1048   NA 138   K 3.3*   CL 104   CO2 28   BUN 17   CREAT 0.8   CA 9.8   ALB 4.4   TPRP 7.2   TBIL 0.4   ALK 91   ALT 49   AST 39*   GLU 129*              Diagnostic Imaging:  CT angiogram chest abdomen pelvis with and without IV contrast  Result Date: 11/24/2023  Indication: Acute aortic syndrome (AAS) suspected. Comparison: None. Technique: Chest, abdomen, and pelvis CT was performed without IV contrast. Chest, abdomen, and pelvis CT was then performed after the uneventful IV administration of 100 mL of Omnipaque  350.  Coronal and sagittal reformats were obtained. 3D MIP obtained as well. Angiographic findings: Aorta: There is a type A aortic dissection extending from just proximal to the brachiocephalic takeoff through the thoracic and abdominal aorta, extending into the left iliac and terminating in the region of the left femoral artery. The brachiocephalic artery and left renal artery appear to be supplied by the false lumen, remainder of the great branching vessels appear to be supplied by the true lumen. The descending thoracic aorta is aneurysmal, measuring up to 4.0 cm. The upper abdominal aorta is aneurysmal, measuring up to 3.3 cm. Celiac Trunk: Supplied by true lumen. Patent. SMA: Supplied by true lumen. Patent. IMA: Supplied by true lumen. Patent. Right Renal: Supplied by true lumen. Patent. Left Renal: Supplied by false lumen. Patent. Iliac arteries: The dissection flap extends into the left common iliac  artery, and through to the level of the femoral artery. Patent. Venous structures: Within normal limits Non-angiographic Findings: Chest: Lungs and pleura: 4 mm left lower lobe nodule ([id number]). No focal consolidation. There is no pleural effusion. There is no pneumothorax. Airway: The central airways appear patent. Esophagus: The esophagus is unremarkable. Heart: The heart is normal size without pericardial effusion. Pulmonary vasculature: The main pulmonary artery is upper limits of normal in caliber. No central pulmonary embolism. Lymph nodes: No enlarged lymph nodes by CT size criteria in the mediastinum, hilar and axillary regions. Thyroid : There is normal attenuation. Bones:  Status post midline sternotomy. Minimal degenerative changes. Soft tissues: Unremarkable. Abdomen:  Stomach:   Normal. Bowel and mesentery:   There is a focally dilated segment of small bowel within the left hemiabdomen which gradually tapers and is without discrete transition point. Liver:   Hepatic steatosis. Portal vein/superior mesenteric vein:  Patent. Gallbladder and bile ducts:  Cholecystectomy. Spleen:  3.1 cm splenule. Pancreas:  Normal. Adrenal Glands:  Normal. Kidneys:  No hydronephrosis or nephrolithiasis. Suspected minimal relative decreased left renal perfusion compared to the right, otherwise without inflammatory changes. Pelvis:  Appendix:  Normal. Bladder:  Normal. Reproductive organs:  Normal. Free fluid:  None. Groin regions:  Normal. Misc:  Lymphatics/nodes:  No lymphadenopathy. Subcutaneous soft tissues:  Normal. Bones:  Mild degenerative changes, most prominent at L5-S1. Impression: 1.  Type A aortic dissection extending from near the origin of the brachiocephalic artery through the left femoral artery. The aorta is aneurysmal at the descending thoracic aorta to 4.0 cm and 3.3 cm in the abdomen. The brachiocephalic artery and left renal artery appeared to be supplied by the false lumen with questionable minimal  relative decreased perfusion of the left kidney. Remainder of the major branches are supplied by the true lumen. 2.  Borderline dilated loop of small bowel within the left hemiabdomen without discrete transition point. Possibly representing an ileus, mild or intermittent obstruction not entirely excluded. Finding of type A aortic dissection through the chest and abdomen was called to Dr. Juanita at 12:55 PM on 11/24/2023. Signed by Attending: Sherida Fruits, MD on 11/24/2023 1:54 PM      Assessment:   Rei Medlen is a 48 y.o. Male with a history of aortic type a dissection, abdominal and back pain x 1 month, worsened overnight..    Plan:   1.  Patient needs  aggressive blood pressure control, systolic needs to be between 110-120  2.  Imaging was reviewed with Dr. Geroldine who will review with Dr. Lander who is the on-call vascular attending at [facility].  3.  Patient needs transfer to [facility], definitive management to be determined between CT surgery as well as vascular.  4.  Keep n.p.o., IV fluids  5.  Will discuss with vascular APP's at [facility]      Electronically signed by Harlene Helling Meger, PA-C

## 2023-11-24 NOTE — Consults (Signed)
 Surgical ICU/NCCU Admission Note  Name: Robert Valdez   DOB: [birth year]   MRN: [id number]   Admit Date: 11/24/2023        Chief Complaint/History of Present Illness     Patient is a 48 y.o. male with PMH HTN, diverticulitis, who had a type A aortic dissection in 2021 and underwent a repair with 30 mm Hemashield platinum vascular graft by cardiothoracic surgery. This was done in [place]  [facility]. Pt moved to [place] 3 years ago.     Over the past month pt has had worsening back pain radiating to his right flank. Since his surgery he has had leg and back pain. He had local injections from the neurosurgeon but has not had any relief. Last night he developed abdominal pain as well. It worsened today and he presented to the ED at [facility]. He denied nausea, vomiting, diarrhea. He had a CTA done which revealed type a dissection extending from near the origin of the brachiocephalic artery through the left femoral artery. The aorta is aneurysmal at the descending thoracic aorta 4 cm and 3.3 cm in the abdomen. The brachiocephalic artery and the left renal artery appear to be supplied by the false lumen with questionable minimal relative decreased perfusion to the left kidney. He was also noted to be hypertensive. He was started on esmolol  and cardene drips.    Upon arrival pt c/o 10 out of 10 abd pain-states he did get some medication earlier which helped. His SBP was 100 on the cuff. Vascular surgery at bedside.      Past Medical and Surgical History     Past Medical History:   Diagnosis Date   . Acute kidney failure (CMS HCC Code)    . Diverticulitis    . Hypertension    . Personal history of diseases of skin and subcutaneous tissue     Converted from [employer]   . Renal failure        Past Surgical History:   Procedure Laterality Date   . CARDIAC SURGERY     . CHOLECYSTECTOMY     . CONVERTED FROM CERNER PROCEDURE HISTORY      Excision of pilonidal cyst or sinus   . CONVERTED FROM CERNER PROCEDURE  HISTORY      S/P wrist surgery   . right wrist surgery for torn tendon         Social and Family History     He  reports that he has been smoking cigarettes. He has a 1.5 pack-year smoking history. He has never used smokeless tobacco. He reports current alcohol use. He reports that he does not use drugs.    His family history includes Hypertension in his father, father, father, mother, mother, and mother.      Medications and Allergies     Current Outpatient Medications on File Prior to Encounter   Medication Sig   . amLODIPine  10 MG Oral tablet Take 1 tablet by mouth daily.   . capsaicin (ZOSTRIX-HP) 0.1 % Top topical cream Apply  topically 3 (three) times daily.   . chlorthalidone  25 MG Oral tablet Take 1 tablet by mouth daily.   SABRA EPINEPHrine 0.3 mg/0.3 mL Inj injection Inject 0.3 mLs into the muscle as needed for Anaphylaxis.   SABRA ergocalciferol (VITAMIN D2) 1,250 mcg (50,000 unit) Oral capsule Take 1 capsule by mouth once a week.   . labetaloL  300 MG Oral tablet Take 1 tablet by mouth 2 (two) times daily.   SABRA  lisinopriL  40 MG Oral tablet Take 1 tablet by mouth daily.   . nortriptyline  50 MG Oral capsule Take 1 capsule by mouth at bedtime.   . sildenafiL 50 MG Oral tablet Take 1 tablet by mouth as needed for Erectile Dysfunction.   . tadalafiL  5 MG Oral tablet Take 1 tablet by mouth every evening.     Current Facility-Administered Medications   Medication Dose Route Frequency   . esmolol   50-200 mcg/kg/min Intravenous Continuous   . niCARdipine (CARDENE) infusion  1-15 mg/hr Intravenous Continuous       He is allergic to shellfish [seafood].    Review of Systems     Pertinent items are noted in HPI.    Vitals and Physical Exam     Vitals:    11/24/23 1515   BP: 129/79   Pulse: 84   Resp: 21   Temp:      No intake/output data recorded.    Hzw:jtjxz, alert oriented x 3  Heart:s1s2  Lungs:clear b/l  Jai:dnqu, obese, tender to palpation in whole abd, +bs , no rebound or guarding  Ext:+dp/pt signals b/l, mae, m/s  intact  Neuro:follows simple commands      Laboratory Data     Lab Results   Component Value Date    WBCR 8.9 11/24/2023    HGB 17.6 (H) 11/24/2023    HCT 49 11/24/2023    MCV 88 11/24/2023    PLTR 183 11/24/2023       Lab Results   Component Value Date    GLU 129 (H) 11/24/2023    CA 9.8 11/24/2023    NA 138 11/24/2023    K 3.3 (L) 11/24/2023    CO2 28 11/24/2023    CL 104 11/24/2023    BUN 17 11/24/2023    CREAT 0.8 11/24/2023       No results found for: INR, PTI    No results found for: APH, APCO2, APO2, ABE, AOSAT    Lab Results   Component Value Date    RUR Gram Positive flora  Colony Count: <10,000 cfu/ml 05/17/2011         Imaging Data     ECG/Telemetry: pending.     Radiology:  ======== ADDENDUM #1 ========  Prior outside MRI of the lumbar spine on 12/08/2022 has since been made available for comparison. While imaging modality is not ideal for comparison of the vasculature, the described dissection may be present on this exam, and dissection may be chronic.  Recommend clinical correlation. Additional contrast-enhanced CT outside imaging would be beneficial for more reliable comparison.     Signed by Attending: Sherida Fruits, MD on 11/24/2023 2:44 PM  ======== ADDENDUM #2 ========  Updated findings were called to Dr. Juanita at 2:40 PM on 11/24/2023.     Signed by Attending: Sherida Fruits, MD on 11/24/2023 2:46 PM  ======== ORIGINAL REPORT ========  Indication: Acute aortic syndrome (AAS) suspected.      Comparison: None.     Technique: Chest, abdomen, and pelvis CT was performed without IV contrast. Chest, abdomen, and pelvis CT was then performed after the uneventful IV administration of 100 mL of Omnipaque  350.  Coronal and sagittal reformats were obtained. 3D MIP obtained  as well.     Angiographic findings:  Aorta: There is a type A aortic dissection extending from just proximal to the brachiocephalic takeoff through the thoracic and abdominal aorta, extending into the left iliac and terminating in the  region of the left femoral  artery. The brachiocephalic  artery and left renal artery appear to be supplied by the false lumen, remainder of the great branching vessels appear to be supplied by the true lumen. The descending thoracic aorta is aneurysmal, measuring up to 4.0 cm. The upper abdominal aorta is  aneurysmal, measuring up to 3.3 cm.  Celiac Trunk: Supplied by true lumen. Patent.  SMA: Supplied by true lumen. Patent.  IMA: Supplied by true lumen. Patent.  Right Renal: Supplied by true lumen. Patent.  Left Renal: Supplied by false lumen. Patent.  Iliac arteries: The dissection flap extends into the left common iliac artery, and through to the level of the femoral artery. Patent.  Venous structures: Within normal limits     Non-angiographic Findings:     Chest:     Lungs and pleura: 4 mm left lower lobe nodule (502-66). No focal consolidation. There is no pleural effusion. There is no pneumothorax.  Airway: The central airways appear patent.   Esophagus: The esophagus is unremarkable.  Heart: The heart is normal size without pericardial effusion.   Pulmonary vasculature: The main pulmonary artery is upper limits of normal in caliber. No central pulmonary embolism.  Lymph nodes: No enlarged lymph nodes by CT size criteria in the mediastinum, hilar and axillary regions.  Thyroid : There is normal attenuation.   Bones:  Status post midline sternotomy. Minimal degenerative changes.  Soft tissues: Unremarkable.     Abdomen:       Stomach:   Normal.  Bowel and mesentery:   There is a focally dilated segment of small bowel within the left hemiabdomen which gradually tapers and is without discrete transition point.  Liver:   Hepatic steatosis.  Portal vein/superior mesenteric vein:  Patent.  Gallbladder and bile ducts:  Cholecystectomy.  Spleen:  3.1 cm splenule.  Pancreas:  Normal.  Adrenal Glands:  Normal.  Kidneys:  No hydronephrosis or nephrolithiasis. Suspected minimal relative decreased left renal perfusion  compared to the right, otherwise without inflammatory changes.     Pelvis:       Appendix:  Normal.   Bladder:  Normal.  Reproductive organs:  Normal.  Free fluid:  None.  Groin regions:  Normal.     Misc:       Lymphatics/nodes:  No lymphadenopathy.  Subcutaneous soft tissues:  Normal.  Bones:  Mild degenerative changes, most prominent at L5-S1.     Impression:   1.  Type A aortic dissection extending from near the origin of the brachiocephalic artery through the left femoral artery. The aorta is aneurysmal at the descending thoracic aorta to 4.0 cm and 3.3 cm in the abdomen. The brachiocephalic artery and left  renal artery appeared to be supplied by the false lumen with questionable minimal relative decreased perfusion of the left kidney. Remainder of the major branches are supplied by the true lumen.  2.  Borderline dilated loop of small bowel within the left hemiabdomen without discrete transition point. Possibly representing an ileus, mild or intermittent obstruction not entirely excluded.     Finding of type A aortic dissection through the chest and abdomen was called to Dr. Juanita at 12:55 PM on 11/24/2023.     Signed by Attending: Sherida Fruits, MD on 11/24/2023 1:54 PM   Addended by Fruits Sherida, MD on 11/24/2023  2:46 PM      ======== ADDENDUM #1 ========  Prior outside MRI of the lumbar spine on 12/08/2022 has since been made available for comparison. While imaging modality is not ideal for  comparison of the vasculature, the described dissection may be present on this exam, and dissection may be chronic.  Recommend clinical correlation. Additional contrast-enhanced CT outside imaging would be beneficial for more reliable comparison.     Signed by Attending: Sherida Fruits, MD on 11/24/2023 2:44 PM  ======== ORIGINAL REPORT ========  Indication: Acute aortic syndrome (AAS) suspected.      Comparison: None.     Technique: Chest, abdomen, and pelvis CT was performed without IV contrast. Chest, abdomen, and pelvis CT was  then performed after the uneventful IV administration of 100 mL of Omnipaque  350.  Coronal and sagittal reformats were obtained. 3D MIP obtained  as well.     Angiographic findings:  Aorta: There is a type A aortic dissection extending from just proximal to the brachiocephalic takeoff through the thoracic and abdominal aorta, extending into the left iliac and terminating in the region of the left femoral artery. The brachiocephalic  artery and left renal artery appear to be supplied by the false lumen, remainder of the great branching vessels appear to be supplied by the true lumen. The descending thoracic aorta is aneurysmal, measuring up to 4.0 cm. The upper abdominal aorta is  aneurysmal, measuring up to 3.3 cm.  Celiac Trunk: Supplied by true lumen. Patent.  SMA: Supplied by true lumen. Patent.  IMA: Supplied by true lumen. Patent.  Right Renal: Supplied by true lumen. Patent.  Left Renal: Supplied by false lumen. Patent.  Iliac arteries: The dissection flap extends into the left common iliac artery, and through to the level of the femoral artery. Patent.  Venous structures: Within normal limits     Non-angiographic Findings:     Chest:     Lungs and pleura: 4 mm left lower lobe nodule (502-66). No focal consolidation. There is no pleural effusion. There is no pneumothorax.  Airway: The central airways appear patent.   Esophagus: The esophagus is unremarkable.  Heart: The heart is normal size without pericardial effusion.   Pulmonary vasculature: The main pulmonary artery is upper limits of normal in caliber. No central pulmonary embolism.  Lymph nodes: No enlarged lymph nodes by CT size criteria in the mediastinum, hilar and axillary regions.  Thyroid : There is normal attenuation.   Bones:  Status post midline sternotomy. Minimal degenerative changes.  Soft tissues: Unremarkable.     Abdomen:       Stomach:   Normal.  Bowel and mesentery:   There is a focally dilated segment of small bowel within the left  hemiabdomen which gradually tapers and is without discrete transition point.  Liver:   Hepatic steatosis.  Portal vein/superior mesenteric vein:  Patent.  Gallbladder and bile ducts:  Cholecystectomy.  Spleen:  3.1 cm splenule.  Pancreas:  Normal.  Adrenal Glands:  Normal.  Kidneys:  No hydronephrosis or nephrolithiasis. Suspected minimal relative decreased left renal perfusion compared to the right, otherwise without inflammatory changes.     Pelvis:       Appendix:  Normal.   Bladder:  Normal.  Reproductive organs:  Normal.  Free fluid:  None.  Groin regions:  Normal.     Misc:       Lymphatics/nodes:  No lymphadenopathy.  Subcutaneous soft tissues:  Normal.  Bones:  Mild degenerative changes, most prominent at L5-S1.     Impression:   1.  Type A aortic dissection extending from near the origin of the brachiocephalic artery through the left femoral artery. The aorta is aneurysmal at the descending thoracic aorta  to 4.0 cm and 3.3 cm in the abdomen. The brachiocephalic artery and left  renal artery appeared to be supplied by the false lumen with questionable minimal relative decreased perfusion of the left kidney. Remainder of the major branches are supplied by the true lumen.  2.  Borderline dilated loop of small bowel within the left hemiabdomen without discrete transition point. Possibly representing an ileus, mild or intermittent obstruction not entirely excluded.     Finding of type A aortic dissection through the chest and abdomen was called to Dr. Juanita at 12:55 PM on 11/24/2023.     Signed by Attending: Sherida Fruits, MD on 11/24/2023 1:54 PM   Addended by Zhao, Billy, MD on 11/24/2023  2:44 PM     Study Result    Narrative & Impression   Indication: Acute aortic syndrome (AAS) suspected.      Comparison: None.     Technique: Chest, abdomen, and pelvis CT was performed without IV contrast. Chest, abdomen, and pelvis CT was then performed after the uneventful IV administration of 100 mL of Omnipaque  350.  Coronal  and sagittal reformats were obtained. 3D MIP obtained  as well.     Angiographic findings:  Aorta: There is a type A aortic dissection extending from just proximal to the brachiocephalic takeoff through the thoracic and abdominal aorta, extending into the left iliac and terminating in the region of the left femoral artery. The brachiocephalic  artery and left renal artery appear to be supplied by the false lumen, remainder of the great branching vessels appear to be supplied by the true lumen. The descending thoracic aorta is aneurysmal, measuring up to 4.0 cm. The upper abdominal aorta is  aneurysmal, measuring up to 3.3 cm.  Celiac Trunk: Supplied by true lumen. Patent.  SMA: Supplied by true lumen. Patent.  IMA: Supplied by true lumen. Patent.  Right Renal: Supplied by true lumen. Patent.  Left Renal: Supplied by false lumen. Patent.  Iliac arteries: The dissection flap extends into the left common iliac artery, and through to the level of the femoral artery. Patent.  Venous structures: Within normal limits     Non-angiographic Findings:     Chest:     Lungs and pleura: 4 mm left lower lobe nodule (502-66). No focal consolidation. There is no pleural effusion. There is no pneumothorax.  Airway: The central airways appear patent.   Esophagus: The esophagus is unremarkable.  Heart: The heart is normal size without pericardial effusion.   Pulmonary vasculature: The main pulmonary artery is upper limits of normal in caliber. No central pulmonary embolism.  Lymph nodes: No enlarged lymph nodes by CT size criteria in the mediastinum, hilar and axillary regions.  Thyroid : There is normal attenuation.   Bones:  Status post midline sternotomy. Minimal degenerative changes.  Soft tissues: Unremarkable.     Abdomen:       Stomach:   Normal.  Bowel and mesentery:   There is a focally dilated segment of small bowel within the left hemiabdomen which gradually tapers and is without discrete transition point.  Liver:   Hepatic  steatosis.  Portal vein/superior mesenteric vein:  Patent.  Gallbladder and bile ducts:  Cholecystectomy.  Spleen:  3.1 cm splenule.  Pancreas:  Normal.  Adrenal Glands:  Normal.  Kidneys:  No hydronephrosis or nephrolithiasis. Suspected minimal relative decreased left renal perfusion compared to the right, otherwise without inflammatory changes.     Pelvis:       Appendix:  Normal.  Bladder:  Normal.  Reproductive organs:  Normal.  Free fluid:  None.  Groin regions:  Normal.     Misc:       Lymphatics/nodes:  No lymphadenopathy.  Subcutaneous soft tissues:  Normal.  Bones:  Mild degenerative changes, most prominent at L5-S1.     Impression:   1.  Type A aortic dissection extending from near the origin of the brachiocephalic artery through the left femoral artery. The aorta is aneurysmal at the descending thoracic aorta to 4.0 cm and 3.3 cm in the abdomen. The brachiocephalic artery and left  renal artery appeared to be supplied by the false lumen with questionable minimal relative decreased perfusion of the left kidney. Remainder of the major branches are supplied by the true lumen.  2.  Borderline dilated loop of small bowel within the left hemiabdomen without discrete transition point. Possibly representing an ileus, mild or intermittent obstruction not entirely excluded.     Finding of type A aortic dissection through the chest and abdomen was called to Dr. Juanita at 12:55 PM on 11/24/2023.     Signed by Attending: Sherida Fruits, MD on 11/24/2023 1:54 PM       Assessment     This is an 48 y.o. male with PMH HTN, previous type aortic dissection repair with continued flank/back pain and now abdominal pain-CTA with type A dissection-ICU requested for close monitoring    Plan       Neuro:q 1 hour neuro checks, CMS checks q 1 hour    Pulm: is, cdb    CV: monitor hr/bp/uop-strict sbp 110-120 on cardene gtt and esmolol  gtt-will dc esmolol  and if bp elevated will start vasotec    GI: protonix /senna/colace-clears as pt not  going to OR until Monday for repair (need previous imaging)    F/E/N: check lytes, replace as needed, gentle IVF    ID: trend wbc    Heme: type and screen-monitor hematocrit-check coags    Endo: keep bgs <180 >100      Josette LITTIE Rattler, PA-C 11/24/2023 5:19 PM     Critical Care Time: 30 minutes.    Time includes direct face to face time spent assessing patient, reviewing pertinent labs, imaging, and results, and developing a plan distinct from attending time.

## 2023-11-25 LAB — UNMAPPED LAB RESULTS
Basophil # (HT): 0.1 10 3/uL (ref 0.0–0.2)
Basophil % (HT): 0 % (ref 0–3)
Eosinophil # (HT): 0.1 10 3/uL (ref 0.0–0.6)
Eosinophil % (HT): 1 % (ref 0–5)
Hematocrit (HT): 46 % (ref 40–52)
Hemoglobin (HGB) (HT): 16.1 g/dL (ref 13.0–18.0)
Lymphocyte # (HT): 1.8 10 3/uL (ref 1.0–4.8)
Lymphocyte % (HT): 15 % (ref 15–45)
MCHC (HT): 35.4 g/dL (ref 32.0–37.5)
MCV (HT): 90 fL (ref 80–100)
Mean Corpuscular Hemoglobin (MCH) (HT): 31.9 pg (ref 26.0–34.0)
Monocyte # (HT): 0.7 10 3/uL (ref 0.1–1.0)
Monocyte % (HT): 6 % (ref 0–15)
Neutrophil # (HT): 9.5 10 3/uL — ABNORMAL HIGH (ref 1.8–8.0)
Platelets (HT): 183 10 3/uL (ref 150–450)
RBC (HT): 5.04 10 6/uL (ref 4.40–6.20)
RDW (HT): 12.7 % (ref 0.0–15.2)
Seg Neut % (HT): 78 % — ABNORMAL HIGH (ref 45–75)
WBC (HT): 12.2 10 3/uL — ABNORMAL HIGH (ref 4.0–11.0)

## 2023-11-25 NOTE — Progress Notes (Signed)
 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  Requiring Cardene drip.  Amlodipine  and labetalol .    Liquids only in case needs emergent OR.  Currently planning for Monday OR    PPI  Bowel regimen    Foley catheter    IV fluid support.  Trend lactic.    Trend renal function.    Outpatient polysomnogram recommended.  Caution with sedating medications.  Monitor for obstructive breathing.    Continue arterial line         Multidisciplinary critical care team rounds completed at bedside. Medication reviewed. Critical care bundles and safety goals reviewed and addressed as above. Treatment goals addressed.    Robert Valdez is a 48 y.o. male admitted 11/24/2023  4:41 PM with Dissection of thoracoabdominal aorta (CMS HCC Code).      Summary of Hospitalization:    48 year old male with a history of type a aortic dissection who underwent repair in [place]  in 2021 with cardiothoracic surgery.  [place] has subsequently moved to [place] about 3 years ago.  Has had worsening back pain radiating to his right flank over the past month.  Has also had chronic leg and back pain since his surgery.  No relief with neurosurgical injections.  He presents now with increasing abdominal pain.  Transferred from [facility] to [facility] after finding of type a dissection extending from brachiocephalic artery to the left femoral artery.  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  Left renal artery supplied by the false lumen with possible decreased  perfusion in the left kidney.  He was hypertensive requiring esmolol  and Cardene drips.    24 Hour Clinical Course:     Remains on Cardene drip.  Troponins negative  Abdominal pain has resolved.  He is having back discomfort from the bed which seems a bit better sitting up in the chair  He has never been tested for sleep apnea    Tolerating liquids.  Would like them to be ice cold.    No family history of aneurysms        Past Surgical History:   Procedure Laterality Date   . CARDIAC SURGERY     . CHOLECYSTECTOMY     . CONVERTED FROM CERNER PROCEDURE HISTORY      Excision of pilonidal cyst or sinus   . CONVERTED FROM CERNER PROCEDURE HISTORY      S/P wrist surgery   . right wrist surgery for torn tendon         Past Medical History:   Diagnosis Date   . Acute kidney failure (CMS HCC Code)    . Diverticulitis    . Hypertension    . Personal history of diseases of skin and subcutaneous tissue     Converted from [employer]   . Renal failure        Prior to Admission medications    Medication Sig Start Date End Date Taking? Authorizing Provider   amLODIPine   10 MG Oral tablet Take 1 tablet by mouth daily. 11/08/23   Loring Belvie Rush, MD   capsaicin (ZOSTRIX-HP) 0.1 % Top topical cream Apply  topically 3 (three) times daily. 03/31/23   Loring Belvie Rush, MD   chlorthalidone  25 MG Oral tablet Take 1 tablet by mouth daily. 11/08/23   Loring Belvie Rush, MD   EPINEPHrine 0.3 mg/0.3 mL Inj injection Inject 0.3 mLs into the muscle as needed for Anaphylaxis. 02/25/22   Patrawala, Sara Sugra, MD   ergocalciferol (VITAMIN D2) 1,250 mcg (50,000 unit) Oral capsule Take 1 capsule by mouth once a week. 07/04/22   Loring Belvie Rush, MD   labetaloL  300 MG Oral tablet Take 1 tablet by mouth 2 (two) times daily. 11/08/23   Loring Belvie Rush, MD   lisinopriL  40 MG Oral tablet Take 1 tablet by mouth daily. 11/08/23   Loring Belvie Rush, MD   nortriptyline  50 MG Oral capsule Take 1 capsule by mouth at bedtime. 11/08/23   Loring Belvie Rush, MD   sildenafiL 50 MG Oral tablet Take 1 tablet by mouth as needed for Erectile Dysfunction. 11/23/22   Mesiti, Karyn, PA-C   tadalafiL  5 MG Oral tablet Take 1 tablet by mouth every evening. 11/08/23   Loring Belvie Rush, MD       Social History     Tobacco Use   . Smoking status: Some Days     Current packs/day: 0.30     Average packs/day: 0.3 packs/day for 5.0 years (1.5 ttl pk-yrs)     Types: Cigarettes   . Smokeless tobacco: Never   Substance Use Topics   . Alcohol use: Yes     Comment: occassionally   . Drug use: No        Family History   Problem Relation Name Age of Onset   . Hypertension Father     . Hypertension Mother     . Hypertension Father     . Hypertension Mother     . Hypertension Mother          Converted from [employer]   . Hypertension Father          Converted from [employer] and [employer]       PIV Line  Duration             Short peripheral catheter 11/24/23 Left Antecubital 1 day    Short peripheral catheter 11/24/23 Right Antecubital 1 day              ART Line  Duration             Arterial Line 11/24/23 Left Radial <1 day              Urinary Catheter   Duration             Urinary Catheter 11/25/23 Temperature Sensing Foley <1 day                  Airways       None                         CURRENT MEDICATIONS:   Scheduled Meds:  . amLODIPine   10 mg Oral Daily   . docusate sodium  100 mg Oral BID   . heparin (porcine)  7,500 Units Subcutaneous Q8H SCH   . labetaloL   300 mg Oral Q8H SCH   . thiamine mononitrate (  vit B1)  100 mg Oral Daily     Continuous Infusions:  . lactated Ringers 125 mL/hr (11/25/23 1300)   . niCARdipine (CARDENE) infusion 10 mg/hr (11/25/23 1300)     PRN Meds:.acetaminophen , calcium  carbonate, HYDROmorphone , oxyCODONE , oxyCODONE     Review of Systems: Uncomfortable but doing better.  No chest pain.  No abdominal pain.  Hungry and thirsty.    I/O:  I/O last 3 completed shifts:  In: 3455.9 [P.O.:400; I.V.:2805.9; IV Piggyback:250]  Out: 725  [Urine:725]  I/O this shift:  In: 2462.1 [P.O.:225; I.V.:1187.1; IV Piggyback:1050]  Out: 275 [Urine:275]    LABORATORY DATA:  BMP:   Lab Results   Component Value Date    GLU 128 (H) 11/25/2023    NA 137 11/25/2023    K 3.9 11/25/2023    CO2 28 11/25/2023    BUN 19 11/25/2023    CREAT 0.8 11/25/2023    CA 8.5 11/25/2023    GFR1 >60 05/06/2013    GFRB1 >60 05/06/2013    PHOS 5.7 (H) 11/24/2023     Latest cardiac markers:   Lab Results   Component Value Date    TROPI 0.01 05/15/2013     Latest CBC:   Lab Results   Component Value Date    WBCR 12.2 (H) 11/25/2023    RBC 5.04 11/25/2023    HGB 16.1 11/25/2023    HCT 46 11/25/2023    MCV 90 11/25/2023    PLTR 183 11/25/2023     Last Coagulation:   Lab Results   Component Value Date    PTT 31.2 11/24/2023     Last ABGs: No results found for: APH, APCO2, APO2, ABE, AOSAT  Last LFTs:   Lab Results   Component Value Date    ALT 50 (H) 11/25/2023    AST 38 (H) 11/25/2023    TBIL 0.8 11/25/2023    DBIL 0.3 11/25/2023    ALK 69 11/25/2023     Last cultures:    Lab Results   Component Value Date    RUR Gram Positive flora  Colony Count: <10,000 cfu/ml 05/17/2011       RADIOLOGY:   CT angiogram chest abdomen pelvis with and without IV contrast  Addendum Date: 11/24/2023  ======== ADDENDUM #1 ======== Prior outside MRI of the lumbar spine on 12/08/2022 has since been made available for comparison. While imaging modality is not ideal for comparison of the vasculature, the described dissection may be present on this exam, and dissection may be chronic. Recommend clinical correlation. Additional contrast-enhanced CT outside imaging would be beneficial for more reliable comparison. Signed by Attending: Sherida Fruits, MD on 11/24/2023 2:44 PM ======== ADDENDUM #2 ======== Updated findings were called to Dr. Juanita at 2:40 PM on 11/24/2023. Signed by Attending: Sherida Fruits, MD on 11/24/2023 2:46 PM ======== ORIGINAL REPORT ======== Indication: Acute aortic syndrome (AAS) suspected.  Comparison: None. Technique: Chest, abdomen, and pelvis CT was performed without IV contrast. Chest, abdomen, and pelvis CT was then performed after the uneventful IV administration of 100 mL of Omnipaque  350.  Coronal and sagittal reformats were obtained. 3D MIP obtained as well. Angiographic findings: Aorta: There is a type A aortic dissection extending from just proximal to the brachiocephalic takeoff through the thoracic and abdominal aorta, extending into the left iliac and terminating in the region of the left femoral artery. The brachiocephalic artery and left renal artery appear to be supplied by the false lumen, remainder of the great branching vessels appear to be  supplied by the true lumen. The descending thoracic aorta is aneurysmal, measuring up to 4.0 cm. The upper abdominal aorta is aneurysmal, measuring up to 3.3 cm. Celiac Trunk: Supplied by true lumen. Patent. SMA: Supplied by true lumen. Patent. IMA: Supplied by true lumen. Patent. Right Renal: Supplied by true lumen. Patent. Left Renal: Supplied by false lumen. Patent. Iliac arteries: The dissection flap extends into the left common iliac artery, and through to the level of the femoral artery. Patent. Venous structures: Within normal limits Non-angiographic Findings: Chest: Lungs and pleura: 4 mm left lower lobe nodule (502-66). No focal consolidation. There is no pleural effusion. There is no pneumothorax. Airway: The central airways appear patent. Esophagus: The esophagus is unremarkable. Heart: The heart is normal size without pericardial effusion. Pulmonary vasculature: The main pulmonary artery is upper limits of normal in caliber. No central pulmonary embolism. Lymph nodes: No enlarged lymph nodes by CT size criteria in the mediastinum, hilar and axillary regions. Thyroid : There is normal attenuation. Bones:  Status post midline sternotomy. Minimal degenerative changes. Soft tissues: Unremarkable. Abdomen:  Stomach:   Normal. Bowel and  mesentery:   There is a focally dilated segment of small bowel within the left hemiabdomen which gradually tapers and is without discrete transition point. Liver:   Hepatic steatosis. Portal vein/superior mesenteric vein:  Patent. Gallbladder and bile ducts:  Cholecystectomy. Spleen:  3.1 cm splenule. Pancreas:  Normal. Adrenal Glands:  Normal. Kidneys:  No hydronephrosis or nephrolithiasis. Suspected minimal relative decreased left renal perfusion compared to the right, otherwise without inflammatory changes. Pelvis:  Appendix:  Normal. Bladder:  Normal. Reproductive organs:  Normal. Free fluid:  None. Groin regions:  Normal. Misc:  Lymphatics/nodes:  No lymphadenopathy. Subcutaneous soft tissues:  Normal. Bones:  Mild degenerative changes, most prominent at L5-S1. Impression: 1.  Type A aortic dissection extending from near the origin of the brachiocephalic artery through the left femoral artery. The aorta is aneurysmal at the descending thoracic aorta to 4.0 cm and 3.3 cm in the abdomen. The brachiocephalic artery and left renal artery appeared to be supplied by the false lumen with questionable minimal relative decreased perfusion of the left kidney. Remainder of the major branches are supplied by the true lumen. 2.  Borderline dilated loop of small bowel within the left hemiabdomen without discrete transition point. Possibly representing an ileus, mild or intermittent obstruction not entirely excluded. Finding of type A aortic dissection through the chest and abdomen was called to Dr. Juanita at 12:55 PM on 11/24/2023. Signed by Attending: Sherida Fruits, MD on 11/24/2023 1:54 PM    Addendum Date: 11/24/2023  ======== ADDENDUM #1 ======== Prior outside MRI of the lumbar spine on 12/08/2022 has since been made available for comparison. While imaging modality is not ideal for comparison of the vasculature, the described dissection may be present on this exam, and dissection may be chronic. Recommend clinical correlation.  Additional contrast-enhanced CT outside imaging would be beneficial for more reliable comparison. Signed by Attending: Sherida Fruits, MD on 11/24/2023 2:44 PM ======== ORIGINAL REPORT ======== Indication: Acute aortic syndrome (AAS) suspected. Comparison: None. Technique: Chest, abdomen, and pelvis CT was performed without IV contrast. Chest, abdomen, and pelvis CT was then performed after the uneventful IV administration of 100 mL of Omnipaque  350.  Coronal and sagittal reformats were obtained. 3D MIP obtained as well. Angiographic findings: Aorta: There is a type A aortic dissection extending from just proximal to the brachiocephalic takeoff through the thoracic and abdominal aorta, extending into the left  iliac and terminating in the region of the left femoral artery. The brachiocephalic artery and left renal artery appear to be supplied by the false lumen, remainder of the great branching vessels appear to be supplied by the true lumen. The descending thoracic aorta is aneurysmal, measuring up to 4.0 cm. The upper abdominal aorta is aneurysmal, measuring up to 3.3 cm. Celiac Trunk: Supplied by true lumen. Patent. SMA: Supplied by true lumen. Patent. IMA: Supplied by true lumen. Patent. Right Renal: Supplied by true lumen. Patent. Left Renal: Supplied by false lumen. Patent. Iliac arteries: The dissection flap extends into the left common iliac artery, and through to the level of the femoral artery. Patent. Venous structures: Within normal limits Non-angiographic Findings: Chest: Lungs and pleura: 4 mm left lower lobe nodule (502-66). No focal consolidation. There is no pleural effusion. There is no pneumothorax. Airway: The central airways appear patent. Esophagus: The esophagus is unremarkable. Heart: The heart is normal size without pericardial effusion. Pulmonary vasculature: The main pulmonary artery is upper limits of normal in caliber. No central pulmonary embolism. Lymph nodes: No enlarged lymph nodes by CT  size criteria in the mediastinum, hilar and axillary regions. Thyroid : There is normal attenuation. Bones:  Status post midline sternotomy. Minimal degenerative changes. Soft tissues: Unremarkable. Abdomen:  Stomach:   Normal. Bowel and mesentery:   There is a focally dilated segment of small bowel within the left hemiabdomen which gradually tapers and is without discrete transition point. Liver:   Hepatic steatosis. Portal vein/superior mesenteric vein:  Patent. Gallbladder and bile ducts:  Cholecystectomy. Spleen:  3.1 cm splenule. Pancreas:  Normal. Adrenal Glands:  Normal. Kidneys:  No hydronephrosis or nephrolithiasis. Suspected minimal relative decreased left renal perfusion compared to the right, otherwise without inflammatory changes. Pelvis:  Appendix:  Normal. Bladder:  Normal. Reproductive organs:  Normal. Free fluid:  None. Groin regions:  Normal. Misc:  Lymphatics/nodes:  No lymphadenopathy. Subcutaneous soft tissues:  Normal. Bones:  Mild degenerative changes, most prominent at L5-S1. Impression: 1.  Type A aortic dissection extending from near the origin of the brachiocephalic artery through the left femoral artery. The aorta is aneurysmal at the descending thoracic aorta to 4.0 cm and 3.3 cm in the abdomen. The brachiocephalic artery and left renal artery appeared to be supplied by the false lumen with questionable minimal relative decreased perfusion of the left kidney. Remainder of the major branches are supplied by the true lumen. 2.  Borderline dilated loop of small bowel within the left hemiabdomen without discrete transition point. Possibly representing an ileus, mild or intermittent obstruction not entirely excluded. Finding of type A aortic dissection through the chest and abdomen was called to Dr. Juanita at 12:55 PM on 11/24/2023. Signed by Attending: Sherida Fruits, MD on 11/24/2023 1:54 PM    Result Date: 11/24/2023  Indication: Acute aortic syndrome (AAS) suspected. Comparison: None. Technique:  Chest, abdomen, and pelvis CT was performed without IV contrast. Chest, abdomen, and pelvis CT was then performed after the uneventful IV administration of 100 mL of Omnipaque  350.  Coronal and sagittal reformats were obtained. 3D MIP obtained as well. Angiographic findings: Aorta: There is a type A aortic dissection extending from just proximal to the brachiocephalic takeoff through the thoracic and abdominal aorta, extending into the left iliac and terminating in the region of the left femoral artery. The brachiocephalic artery and left renal artery appear to be supplied by the false lumen, remainder of the great branching vessels appear to be supplied by the true  lumen. The descending thoracic aorta is aneurysmal, measuring up to 4.0 cm. The upper abdominal aorta is aneurysmal, measuring up to 3.3 cm. Celiac Trunk: Supplied by true lumen. Patent. SMA: Supplied by true lumen. Patent. IMA: Supplied by true lumen. Patent. Right Renal: Supplied by true lumen. Patent. Left Renal: Supplied by false lumen. Patent. Iliac arteries: The dissection flap extends into the left common iliac artery, and through to the level of the femoral artery. Patent. Venous structures: Within normal limits Non-angiographic Findings: Chest: Lungs and pleura: 4 mm left lower lobe nodule (502-66). No focal consolidation. There is no pleural effusion. There is no pneumothorax. Airway: The central airways appear patent. Esophagus: The esophagus is unremarkable. Heart: The heart is normal size without pericardial effusion. Pulmonary vasculature: The main pulmonary artery is upper limits of normal in caliber. No central pulmonary embolism. Lymph nodes: No enlarged lymph nodes by CT size criteria in the mediastinum, hilar and axillary regions. Thyroid : There is normal attenuation. Bones:  Status post midline sternotomy. Minimal degenerative changes. Soft tissues: Unremarkable. Abdomen:  Stomach:   Normal. Bowel and mesentery:   There is a focally  dilated segment of small bowel within the left hemiabdomen which gradually tapers and is without discrete transition point. Liver:   Hepatic steatosis. Portal vein/superior mesenteric vein:  Patent. Gallbladder and bile ducts:  Cholecystectomy. Spleen:  3.1 cm splenule. Pancreas:  Normal. Adrenal Glands:  Normal. Kidneys:  No hydronephrosis or nephrolithiasis. Suspected minimal relative decreased left renal perfusion compared to the right, otherwise without inflammatory changes. Pelvis:  Appendix:  Normal. Bladder:  Normal. Reproductive organs:  Normal. Free fluid:  None. Groin regions:  Normal. Misc:  Lymphatics/nodes:  No lymphadenopathy. Subcutaneous soft tissues:  Normal. Bones:  Mild degenerative changes, most prominent at L5-S1. Impression: 1.  Type A aortic dissection extending from near the origin of the brachiocephalic artery through the left femoral artery. The aorta is aneurysmal at the descending thoracic aorta to 4.0 cm and 3.3 cm in the abdomen. The brachiocephalic artery and left renal artery appeared to be supplied by the false lumen with questionable minimal relative decreased perfusion of the left kidney. Remainder of the major branches are supplied by the true lumen. 2.  Borderline dilated loop of small bowel within the left hemiabdomen without discrete transition point. Possibly representing an ileus, mild or intermittent obstruction not entirely excluded. Finding of type A aortic dissection through the chest and abdomen was called to Dr. Juanita at 12:55 PM on 11/24/2023. Signed by Attending: Sherida Fruits, MD on 11/24/2023 1:54 PM    EKG  Result Date: 11/24/2023  Juanita Rue, MD     11/24/2023  3:25 PM -------------------- EKG Date/Time: 11/24/2023 11:06 AM Performed by: Juanita Rue, MD Authorized by: Juanita Rue, MD  EKG reviewed: EKG reviewed and personally interpreted by me Cardiac rate (bpm): 100 bpm EKG rhythm: sinus rhythm Axis: normal PR interval: normal PR interval QRS interval: normal QRS  interval QT interval: normal QT interval ST segment: ST segments normal T wave:non-specific ST-T wave changes Clinical impression: Normal sinus rhythm, nonspecific st/t changes --------------------    EKG reviewed.  Some nonspecific ST changes.  Troponin is negative and no chest pain.    VITAL SIGNS:  Blood pressure 130/65, pulse 77, temperature 36.3 C (97.4 F), temperature source Axillary, resp. rate 23, height 6' 2 (1.88 m), weight (!) 302 lb 0.5 oz (137 kg), SpO2 96%.    PHYSICAL EXAMINATION: The patient was examined during rounds.   General: Large habitus  Large neck circumference  When drifts off to sleep has sonorous respirations even sitting upright  Heart is regular  Lungs are clear  Abdomen obese/protuberant  Able to move all 4 extremities to command.       Attestation:    This patient is critically ill with at least 1 organ system failure associated with a high probability of imminent or life threatening deterioration. the care I have delivered involved high complexity decision making to assess, manipulate, and support vital system function(s), to treat the vital organ system failure and/or prevent further life threatening deterioration of the patient's condition. All nursing documentation, laboratory data, test results, and radiographs were reviewed and interpreted by me. I have established the management plan for this patient's critical illness and have been immediately available to assist with patient care. my documented total critical care time reflects my own patient care time and does not include teaching or procedure time.    Critical Care Time: I spent 38 minutes personally attending to this patient's critical care needs.       Lorrene Aran, DO  Pulmonary and Critical Care Medicine  [facility] SICU / NCCU

## 2023-11-25 NOTE — Progress Notes (Signed)
 Vascular Surgery PA Progress Note    Subjective:  Patient continues to complain of back pain. Mostly in the mid- left side.  He also reports numbness/tingling in his bilateral feet.  This is unchanged for many months. He does admit that the pain is improved with pain medication. He denies pain numbness, tingling in his upper extremities. No CP, SOB, no abdominal pain.   He did have an episode of emesis last night after drinking a high volume of liquids and taking pain medicine. He is hungry and would like to eat.     Physical Exam:  Vitals:    11/25/23 1100   BP:    Pulse: 80   Resp: 23   Temp: 36.3 C (97.4 F)         Gen: Alert, 48 yo male sitting in recliner chair, oriented, NAD  Card: RRR, S1S2 no murmurs   Pulm: non-labored breathing, no wheezes, rhonchi, rales   Abd: obese, +BS, soft, NT, ND  Zku:mphyu radial aline, + radial pulse.  1+ left radial pulse feet are warm with DP/PT signals bilaterally.  DF/PF 5/5 and equal.  + numbness/tingling in bilateral feet/toes (baseline)    Labs:  Lab Results   Component Value Date    WBCR 12.2 (H) 11/25/2023    HGB 16.1 11/25/2023    HCT 46 11/25/2023    MCV 90 11/25/2023    PLTR 183 11/25/2023     Lab Results   Component Value Date    GLU 110 (H) 11/25/2023    CA 8.4 (L) 11/25/2023    NA 139 11/25/2023    K 4.0 11/25/2023    CL 103 11/25/2023    BUN 20 11/25/2023    CREAT 1.0 11/25/2023       A/P: 48 yo male with history of Aortic Dissection s/p repair at [facility] now with back pain and uncontrolled HTN     Plan:  AVSS, BP controlled with goal less than 120 mmHg on Cardene gtt.  Esmolol  weaned off last night.    Hematocrit and creatinine stable. Good urine output. Trend BMP  Mild leukocytosis, afebrile.  Monitor.   Old scans obtained from [facility] and dissection appears stable as compared to last scan 2022.   Perfusion intact. Continue CMS, doppler checks and monitor for end organ malperfusion. Trend labs, lactic acid.   OK for clear liquid diet.   Continue bedrest   Continue  strict BP control and pain management.  If unable to control pain, may require OR next week.   Appreciate SICU monitoring.   Lauraine Barnacle Hossenlopp, PA  Vascular Surgery  Pager [phone] M-F 6a-6p  Outside of these hours please call the surgical hospitalist   11/25/23  11:22 AM

## 2023-11-26 LAB — UNMAPPED LAB RESULTS
Basophil # (HT): 0 10 3/uL (ref 0.0–0.2)
Basophil % (HT): 0 % (ref 0–3)
Eosinophil # (HT): 0.1 10 3/uL (ref 0.0–0.6)
Eosinophil % (HT): 1 % (ref 0–5)
Hematocrit (HT): 42 % (ref 40–52)
Hemoglobin (HGB) (HT): 14.5 g/dL (ref 13.0–18.0)
Lymphocyte # (HT): 1.8 10 3/uL (ref 1.0–4.8)
Lymphocyte % (HT): 17 % (ref 15–45)
MCHC (HT): 34.2 g/dL (ref 32.0–37.5)
MCV (HT): 91 fL (ref 80–100)
Mean Corpuscular Hemoglobin (MCH) (HT): 31 pg (ref 26.0–34.0)
Monocyte # (HT): 0.8 10 3/uL (ref 0.1–1.0)
Monocyte % (HT): 7 % (ref 0–15)
Neutrophil # (HT): 7.8 10 3/uL (ref 1.8–8.0)
Platelets (HT): 157 10 3/uL (ref 150–450)
RBC (HT): 4.68 10 6/uL (ref 4.40–6.20)
RDW (HT): 12.4 % (ref 0.0–15.2)
Seg Neut % (HT): 74 % (ref 45–75)
WBC (HT): 10.6 10 3/uL (ref 4.0–11.0)

## 2023-11-26 NOTE — Progress Notes (Signed)
 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  Requiring Cardene drip.  Amlodipine  and labetalol .  Requiring max cardene.   Responded to IV enalapril.   Cr stable so far    Consider hydralazine addition +- imdur    Liquids only in case needs emergent OR.  Vascular plan as outlined    PPI  Bowel regimen    Foley catheter  Art Line    IV fluid support.  Trend lactic.    Trend renal function.    Outpatient polysomnogram recommended.  Caution with sedating medications.  Monitor for obstructive breathing. Could certainly be contributing to his uncontrolled BP           Multidisciplinary critical care team rounds completed at bedside. Medication reviewed. Critical care bundles and safety goals reviewed and addressed as above. Treatment goals addressed.    Robert Valdez is a 48 y.o. male admitted 11/24/2023  4:41 PM with Dissection of thoracoabdominal aorta (CMS HCC Code).      Summary of Hospitalization:    48 year old male with a history of type a aortic dissection who underwent repair in [place]  in 2021 with cardiothoracic surgery.  [place] has subsequently moved to [place] about 3 years ago.  Has had worsening back pain radiating to his right flank over the past month.  Has also had chronic leg and back pain since his surgery.  No relief with neurosurgical injections.  He presents now with increasing abdominal pain.  Transferred from [facility] to [facility] after finding of type a dissection extending from brachiocephalic artery to the left femoral  artery.  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  Left renal artery supplied by the false lumen with possible decreased perfusion in the left kidney.  He was hypertensive requiring esmolol  and Cardene drips.    24 Hour Clinical Course:     Remains on Cardene drip. Received vasotec ON. Cr stable. Labs unremarkable.       Lines and Drains       PIV Line  Duration             Short peripheral catheter 11/24/23 Left Antecubital 1 day    Short peripheral catheter 11/24/23 Right Antecubital 1 day              ART Line  Duration             Arterial Line 11/24/23 Left Radial 1 day              Urinary Catheter   Duration             Urinary Catheter 11/25/23 Temperature Sensing Foley 1 day                  Airways       None                         CURRENT MEDICATIONS:  Scheduled Meds:  . amLODIPine   10 mg Oral Daily   . docusate sodium  100 mg Oral BID   . heparin (porcine)  7,500 Units Subcutaneous Q8H SCH   . labetaloL   400 mg Oral Q8H SCH   . polyethylene glycol (MIRALAX) powder  17 g Oral Daily   . thiamine mononitrate (vit B1)  100 mg Oral Daily     Continuous Infusions:  . niCARdipine (CARDENE) infusion 15 mg/hr (11/26/23 0700)     PRN Meds:.acetaminophen , calcium  carbonate, HYDROmorphone , oxyCODONE , oxyCODONE     Review of Systems: Sleeping     I/O:  I/O last 3 completed shifts:  In: 6865.1 [P.O.:825; I.V.:3890.1; IV Piggyback:2150]  Out: 2645 [Urine:2645]  No intake/output data recorded.    LABORATORY DATA:  BMP:   Lab Results   Component Value Date    GLU 112 (H) 11/26/2023    NA 135 11/26/2023    K 3.6 11/26/2023    CO2 29 11/26/2023    BUN 12 11/26/2023    CREAT 0.7 11/26/2023    CA 8.1 (L) 11/26/2023    GFR1 >60 05/06/2013    GFRB1 >60 05/06/2013    PHOS 5.7 (H) 11/24/2023     Latest cardiac markers:   Lab Results   Component Value Date    TROPI 0.01 05/15/2013     Latest CBC:   Lab Results   Component Value Date    WBCR 10.6 11/26/2023    RBC 4.68 11/26/2023    HGB 14.5 11/26/2023     HCT 42 11/26/2023    MCV 91 11/26/2023    PLTR 157 11/26/2023     Last Coagulation:   Lab Results   Component Value Date    PTT 31.2 11/24/2023     Last ABGs: No results found for: APH, APCO2, APO2, ABE, AOSAT  Last LFTs:   Lab Results   Component Value Date    ALT 47 11/26/2023    AST 37 11/26/2023    TBIL 1.0 11/26/2023    DBIL 0.3 11/26/2023    ALK 70 11/26/2023     Last cultures:    Lab Results   Component Value Date    RUR Gram Positive flora  Colony Count: <10,000 cfu/ml 05/17/2011       RADIOLOGY:   CT angiogram chest abdomen pelvis with and without IV contrast  Addendum Date: 11/24/2023  ======== ADDENDUM #1 ======== Prior outside MRI of the lumbar spine on 12/08/2022 has since been made available for comparison. While imaging modality is not ideal for comparison of the vasculature, the described dissection may be present on this exam, and dissection may be chronic. Recommend clinical correlation. Additional contrast-enhanced CT outside imaging would be beneficial for more reliable comparison. Signed by Attending: Sherida Fruits, MD on 11/24/2023 2:44 PM ======== ADDENDUM #2 ======== Updated findings were called to Dr. Juanita at 2:40 PM on 11/24/2023. Signed by Attending: Sherida Fruits, MD on 11/24/2023 2:46 PM ======== ORIGINAL REPORT ======== Indication: Acute aortic syndrome (AAS) suspected. Comparison: None. Technique: Chest, abdomen, and pelvis CT was performed without IV contrast. Chest, abdomen, and pelvis CT was then performed after the uneventful IV administration of 100 mL of Omnipaque  350.  Coronal and sagittal reformats were obtained. 3D MIP obtained as well. Angiographic findings: Aorta: There is a type A aortic dissection extending from just proximal to the brachiocephalic takeoff through the thoracic and abdominal aorta, extending into the left iliac and terminating in the region of the left femoral artery. The brachiocephalic  artery and left renal artery appear to be supplied by the false lumen,  remainder of the great branching vessels appear to be supplied by the true lumen. The descending thoracic aorta is aneurysmal, measuring up to 4.0 cm. The upper abdominal aorta is aneurysmal, measuring up to 3.3 cm. Celiac Trunk: Supplied by true lumen. Patent. SMA: Supplied by true lumen. Patent. IMA: Supplied by true lumen. Patent. Right Renal: Supplied by true lumen. Patent. Left Renal: Supplied by false lumen. Patent. Iliac arteries: The dissection flap extends into the left common iliac artery, and through to the level of the femoral artery. Patent. Venous structures: Within normal limits Non-angiographic Findings: Chest: Lungs and pleura: 4 mm left lower lobe nodule (502-66). No focal consolidation. There is no pleural effusion. There is no pneumothorax. Airway: The central airways appear patent. Esophagus: The esophagus is unremarkable. Heart: The heart is normal size without pericardial effusion. Pulmonary vasculature: The main pulmonary artery is upper limits of normal in caliber. No central pulmonary embolism. Lymph nodes: No enlarged lymph nodes by CT size criteria in the mediastinum, hilar and axillary regions. Thyroid : There is normal attenuation. Bones:  Status post midline sternotomy. Minimal degenerative changes. Soft tissues: Unremarkable. Abdomen:  Stomach:   Normal. Bowel and mesentery:   There is a focally dilated segment of small bowel within the left hemiabdomen which gradually tapers and is without discrete transition point. Liver:   Hepatic steatosis. Portal vein/superior mesenteric vein:  Patent. Gallbladder and bile ducts:  Cholecystectomy. Spleen:  3.1 cm splenule. Pancreas:  Normal. Adrenal Glands:  Normal. Kidneys:  No hydronephrosis or nephrolithiasis. Suspected minimal relative decreased left renal perfusion compared to the right, otherwise without inflammatory changes. Pelvis:  Appendix:  Normal. Bladder:  Normal. Reproductive organs:  Normal. Free fluid:  None. Groin regions:   Normal. Misc:  Lymphatics/nodes:  No lymphadenopathy. Subcutaneous soft tissues:  Normal. Bones:  Mild degenerative changes, most prominent at L5-S1. Impression: 1.  Type A aortic dissection extending from near the origin of the brachiocephalic artery through the left femoral artery. The aorta is aneurysmal at the descending thoracic aorta to 4.0 cm and 3.3 cm in the abdomen. The brachiocephalic artery and left renal artery appeared to be supplied by the false lumen with questionable minimal relative decreased perfusion of the left kidney. Remainder of the major branches are supplied by the true lumen. 2.  Borderline dilated loop of small bowel within the left hemiabdomen without discrete transition point. Possibly representing an ileus, mild or intermittent obstruction not entirely excluded. Finding of type A aortic dissection through the chest and abdomen was called to Dr. Juanita at 12:55 PM on 11/24/2023. Signed by Attending: Sherida Fruits, MD on 11/24/2023 1:54 PM    Addendum Date: 11/24/2023  ======== ADDENDUM #1 ======== Prior outside MRI of the lumbar spine on 12/08/2022 has since been made available for comparison. While imaging modality is not ideal for comparison of the vasculature, the described dissection may be present on this exam, and dissection may be chronic. Recommend clinical correlation. Additional contrast-enhanced CT outside imaging would be beneficial for more reliable comparison. Signed by Attending: Sherida Fruits, MD on 11/24/2023 2:44 PM ======== ORIGINAL REPORT ======== Indication: Acute aortic syndrome (AAS) suspected. Comparison: None. Technique: Chest, abdomen, and pelvis CT was performed without IV contrast. Chest, abdomen, and pelvis CT was then performed after the uneventful IV administration of 100 mL of Omnipaque  350.  Coronal and sagittal reformats were obtained. 3D MIP obtained as well. Angiographic findings: Aorta: There is a  type A aortic dissection extending from just proximal to the  brachiocephalic takeoff through the thoracic and abdominal aorta, extending into the left iliac and terminating in the region of the left femoral artery. The brachiocephalic artery and left renal artery appear to be supplied by the false lumen, remainder of the great branching vessels appear to be supplied by the true lumen. The descending thoracic aorta is aneurysmal, measuring up to 4.0 cm. The upper abdominal aorta is aneurysmal, measuring up to 3.3 cm. Celiac Trunk: Supplied by true lumen. Patent. SMA: Supplied by true lumen. Patent. IMA: Supplied by true lumen. Patent. Right Renal: Supplied by true lumen. Patent. Left Renal: Supplied by false lumen. Patent. Iliac arteries: The dissection flap extends into the left common iliac artery, and through to the level of the femoral artery. Patent. Venous structures: Within normal limits Non-angiographic Findings: Chest: Lungs and pleura: 4 mm left lower lobe nodule (502-66). No focal consolidation. There is no pleural effusion. There is no pneumothorax. Airway: The central airways appear patent. Esophagus: The esophagus is unremarkable. Heart: The heart is normal size without pericardial effusion. Pulmonary vasculature: The main pulmonary artery is upper limits of normal in caliber. No central pulmonary embolism. Lymph nodes: No enlarged lymph nodes by CT size criteria in the mediastinum, hilar and axillary regions. Thyroid : There is normal attenuation. Bones:  Status post midline sternotomy. Minimal degenerative changes. Soft tissues: Unremarkable. Abdomen:  Stomach:   Normal. Bowel and mesentery:   There is a focally dilated segment of small bowel within the left hemiabdomen which gradually tapers and is without discrete transition point. Liver:   Hepatic steatosis. Portal vein/superior mesenteric vein:  Patent. Gallbladder and bile ducts:  Cholecystectomy. Spleen:  3.1 cm splenule. Pancreas:  Normal. Adrenal Glands:  Normal. Kidneys:  No hydronephrosis or  nephrolithiasis. Suspected minimal relative decreased left renal perfusion compared to the right, otherwise without inflammatory changes. Pelvis:  Appendix:  Normal. Bladder:  Normal. Reproductive organs:  Normal. Free fluid:  None. Groin regions:  Normal. Misc:  Lymphatics/nodes:  No lymphadenopathy. Subcutaneous soft tissues:  Normal. Bones:  Mild degenerative changes, most prominent at L5-S1. Impression: 1.  Type A aortic dissection extending from near the origin of the brachiocephalic artery through the left femoral artery. The aorta is aneurysmal at the descending thoracic aorta to 4.0 cm and 3.3 cm in the abdomen. The brachiocephalic artery and left renal artery appeared to be supplied by the false lumen with questionable minimal relative decreased perfusion of the left kidney. Remainder of the major branches are supplied by the true lumen. 2.  Borderline dilated loop of small bowel within the left hemiabdomen without discrete transition point. Possibly representing an ileus, mild or intermittent obstruction not entirely excluded. Finding of type A aortic dissection through the chest and abdomen was called to Dr. Juanita at 12:55 PM on 11/24/2023. Signed by Attending: Sherida Fruits, MD on 11/24/2023 1:54 PM    Result Date: 11/24/2023  Indication: Acute aortic syndrome (AAS) suspected. Comparison: None. Technique: Chest, abdomen, and pelvis CT was performed without IV contrast. Chest, abdomen, and pelvis CT was then performed after the uneventful IV administration of 100 mL of Omnipaque  350.  Coronal and sagittal reformats were obtained. 3D MIP obtained as well. Angiographic findings: Aorta: There is a type A aortic dissection extending from just proximal to the brachiocephalic takeoff through the thoracic and abdominal aorta, extending into the left iliac and terminating in the region of the left femoral artery. The brachiocephalic artery and left renal  artery appear to be supplied by the false lumen, remainder of the  great branching vessels appear to be supplied by the true lumen. The descending thoracic aorta is aneurysmal, measuring up to 4.0 cm. The upper abdominal aorta is aneurysmal, measuring up to 3.3 cm. Celiac Trunk: Supplied by true lumen. Patent. SMA: Supplied by true lumen. Patent. IMA: Supplied by true lumen. Patent. Right Renal: Supplied by true lumen. Patent. Left Renal: Supplied by false lumen. Patent. Iliac arteries: The dissection flap extends into the left common iliac artery, and through to the level of the femoral artery. Patent. Venous structures: Within normal limits Non-angiographic Findings: Chest: Lungs and pleura: 4 mm left lower lobe nodule (502-66). No focal consolidation. There is no pleural effusion. There is no pneumothorax. Airway: The central airways appear patent. Esophagus: The esophagus is unremarkable. Heart: The heart is normal size without pericardial effusion. Pulmonary vasculature: The main pulmonary artery is upper limits of normal in caliber. No central pulmonary embolism. Lymph nodes: No enlarged lymph nodes by CT size criteria in the mediastinum, hilar and axillary regions. Thyroid : There is normal attenuation. Bones:  Status post midline sternotomy. Minimal degenerative changes. Soft tissues: Unremarkable. Abdomen:  Stomach:   Normal. Bowel and mesentery:   There is a focally dilated segment of small bowel within the left hemiabdomen which gradually tapers and is without discrete transition point. Liver:   Hepatic steatosis. Portal vein/superior mesenteric vein:  Patent. Gallbladder and bile ducts:  Cholecystectomy. Spleen:  3.1 cm splenule. Pancreas:  Normal. Adrenal Glands:  Normal. Kidneys:  No hydronephrosis or nephrolithiasis. Suspected minimal relative decreased left renal perfusion compared to the right, otherwise without inflammatory changes. Pelvis:  Appendix:  Normal. Bladder:  Normal. Reproductive organs:  Normal. Free fluid:  None. Groin regions:  Normal. Misc:   Lymphatics/nodes:  No lymphadenopathy. Subcutaneous soft tissues:  Normal. Bones:  Mild degenerative changes, most prominent at L5-S1. Impression: 1.  Type A aortic dissection extending from near the origin of the brachiocephalic artery through the left femoral artery. The aorta is aneurysmal at the descending thoracic aorta to 4.0 cm and 3.3 cm in the abdomen. The brachiocephalic artery and left renal artery appeared to be supplied by the false lumen with questionable minimal relative decreased perfusion of the left kidney. Remainder of the major branches are supplied by the true lumen. 2.  Borderline dilated loop of small bowel within the left hemiabdomen without discrete transition point. Possibly representing an ileus, mild or intermittent obstruction not entirely excluded. Finding of type A aortic dissection through the chest and abdomen was called to Dr. Juanita at 12:55 PM on 11/24/2023. Signed by Attending: Sherida Fruits, MD on 11/24/2023 1:54 PM    EKG  Result Date: 11/24/2023  Juanita Rue, MD     11/24/2023  3:25 PM -------------------- EKG Date/Time: 11/24/2023 11:06 AM Performed by: Juanita Rue, MD Authorized by: Juanita Rue, MD  EKG reviewed: EKG reviewed and personally interpreted by me Cardiac rate (bpm): 100 bpm EKG rhythm: sinus rhythm Axis: normal PR interval: normal PR interval QRS interval: normal QRS interval QT interval: normal QT interval ST segment: ST segments normal T wave:non-specific ST-T wave changes Clinical impression: Normal sinus rhythm, nonspecific st/t changes --------------------      VITAL SIGNS:  Blood pressure 117/51, pulse 73, temperature 37.3 C (99.1 F), resp. rate 21, height 6' 2 (1.88 m), weight (!) 302 lb 0.5 oz (137 kg), SpO2 91%.    PHYSICAL EXAMINATION: The patient was examined during rounds.   General:  Large habitus  Large neck circumference  Sleeping upright in recliner.  Mild snoring.   Heart is regular  Breathing unlabored.   Abdomen  obese/protuberant          Attestation:    This patient is critically ill with at least 1 organ system failure associated with a high probability of imminent or life threatening deterioration. the care I have delivered involved high complexity decision making to assess, manipulate, and support vital system function(s), to treat the vital organ system failure and/or prevent further life threatening deterioration of the patient's condition. All nursing documentation, laboratory data, test results, and radiographs were reviewed and interpreted by me. I have established the management plan for this patient's critical illness and have been immediately available to assist with patient care. my documented total critical care time reflects my own patient care time and does not include teaching or procedure time.    Critical Care Time: I spent 35 minutes personally attending to this patient's critical care needs.       Lorrene Aran, DO  Pulmonary and Critical Care Medicine  [facility] SICU / NCCU

## 2023-11-26 NOTE — Progress Notes (Signed)
 Vascular Surgery PA Progress Note    Subjective:  Patient's pain much better controlled today.  He still reports mid back pain extending into the left flank.  + numbness/tingling in his feet, (this has been ongoing)  right greater than left.  He denies CP, SOB, abdominal pain, N/V.  He would like to get up and walk.  He is hungry.     Physical Exam:  Vitals:    11/26/23 1000   BP:    Pulse: 77   Resp:    Temp: 37.2 C (99 F)     Gen: Alert, 48 yo male sitting in recliner chair, oriented, NAD  Card: RRR, S1S2 no murmurs   Pulm: non-labored breathing, no wheezes, rhonchi, rales   Abd: obese, +BS, distended  Zku:mphyu radial aline, + radial pulse.  1+ left radial pulse feet are warm with DP/PT signals bilaterally.  DF/PF 5/5 and equal.  + numbness/tingling in bilateral feet/toes (baseline)        Labs:  Lab Results   Component Value Date    WBCR 10.6 11/26/2023    HGB 14.5 11/26/2023    HCT 42 11/26/2023    MCV 91 11/26/2023    PLTR 157 11/26/2023     Lab Results   Component Value Date    GLU 112 (H) 11/26/2023    CA 8.1 (L) 11/26/2023    NA 135 11/26/2023    K 3.6 11/26/2023    CL 102 11/26/2023    BUN 12 11/26/2023    CREAT 0.7 11/26/2023     Last LFTs:   Lab Results   Component Value Date    ALT 47 11/26/2023    AST 37 11/26/2023    TBIL 1.0 11/26/2023    DBIL 0.3 11/26/2023    ALK 70 11/26/2023       Lactate: 1.4---1.8--2.4     Impression:   1.  Type A aortic dissection extending from near the origin of the brachiocephalic artery through the left femoral artery. The aorta is aneurysmal at the descending thoracic aorta to 4.0 cm and 3.3 cm in the abdomen. The brachiocephalic artery and left  renal artery appeared to be supplied by the false lumen with questionable minimal relative decreased perfusion of the left kidney. Remainder of the major branches are supplied by the true lumen.  2.  Borderline dilated loop of small bowel within the left hemiabdomen without discrete transition point. Possibly representing an  ileus, mild or intermittent obstruction not entirely excluded.     A/P: 48 yo male with h/o Type A Aortic dissection s/p ascending repair in [facility] admitted with uncontrolled HTN and back pain.     Plan:  AVSS, Still on Cardene at 15.  Titrating up on PO labetolol and PO Amlodipine . Continue strict BP control of SBP less than 120 mmHg and pain management   Pain much improved today.  Received 5 doses IV dilaudid  yesterday.  Taking PO oxycodone  and feels better.   Bowel regimen while on narcotics.   Still with neuropathy in bilateral feet, which is baseline.   Good u/o, creatinine normal, Left renal supplied by false lumen  Leukocytosis resolved  Lactate normalized   LFTs normalized.    Perfusion intact, continue CMS/doppler/pulse checks.   Old scans obtained from [facility] and dissection appears stable as compared to last scan 2022.   OK to eat today, NPO after MN. Per Dr. Lander.   IS, pulmonary toilet. Likely OSA.   DVT prophylaxis, SCDs and prophylactic heparin  Surgical plan/timing pending BP and pain control and further review by Drs. Kuehlewind/Lydon.   Appreciate SICU monitoring.     Lauraine Barnacle Hossenlopp, PA  Vascular Surgery  Pager [phone] M-F 6a-6p  Outside of these hours please call the surgical hospitalist   11/26/23  10:51 AM

## 2023-11-27 LAB — UNMAPPED LAB RESULTS
Basophil # (HT): 0 10 3/uL (ref 0.0–0.2)
Basophil % (HT): 0 % (ref 0–3)
Eosinophil # (HT): 0.2 10 3/uL (ref 0.0–0.6)
Eosinophil % (HT): 2 % (ref 0–5)
Hematocrit (HT): 42 % (ref 40–52)
Hemoglobin (HGB) (HT): 14.5 g/dL (ref 13.0–18.0)
Lymphocyte # (HT): 1.7 10 3/uL (ref 1.0–4.8)
Lymphocyte % (HT): 16 % (ref 15–45)
MCHC (HT): 34.9 g/dL (ref 32.0–37.5)
MCV (HT): 91 fL (ref 80–100)
Mean Corpuscular Hemoglobin (MCH) (HT): 31.7 pg (ref 26.0–34.0)
Monocyte # (HT): 1 10 3/uL (ref 0.1–1.0)
Monocyte % (HT): 9 % (ref 0–15)
Neutrophil # (HT): 8.1 10 3/uL — ABNORMAL HIGH (ref 1.8–8.0)
Platelets (HT): 152 10 3/uL (ref 150–450)
RBC (HT): 4.57 10 6/uL (ref 4.40–6.20)
RDW (HT): 12.2 % (ref 0.0–15.2)
Seg Neut % (HT): 73 % (ref 45–75)
WBC (HT): 11 10 3/uL (ref 4.0–11.0)

## 2023-11-27 NOTE — Progress Notes (Signed)
 Vascular Surgery Progress Note     Patient states that he is doing okay this morning. He feels congested, which started yesterday morning and is asking to take off his oxygen due to making his nose feel dry. He blew his nose this morning and had blood tinged secretions. He has baseline neuropathy in both of his feet that started after his previous aortic repair. He denies numbness, tingling or weakness in his arms.     He states that he has been out of bed once over the weekend. It helped his pain being up and moving. He reports that he is an active person. He states that he has been trying to space out the pain medications a bit more, as to not get addicted, but he has found that his pain sometimes goes up to a 9 or 10. His pain is worse when he is sitting or laying in bed. He has been sleeping in the chair which he finds more comfortable. He denies being pain-free since he has been here. His blood pressures are close to 120 but that is with being maxed out on Cardene and with multiple additional oral agents.     Objective:   Blood pressure 131/53, pulse 83, temperature 37 C (98.6 F), resp. rate (!) 35, height 6' 2 (1.88 m), weight (!) 302 lb 0.5 oz (137 kg), SpO2 96%.  I/O last 3 completed shifts:  In: 2123.1 [P.O.:400; I.V.:1723.1]  Out: 2265 [Urine:2265]    Labs:  BMP:   Lab Results   Component Value Date    GLU 111 (H) 11/27/2023    NA 134 (L) 11/27/2023    K 3.9 11/27/2023    CO2 30 11/27/2023    BUN 12 11/27/2023    CREAT 0.8 11/27/2023    CA 8.2 (L) 11/27/2023    GFR1 >60 05/06/2013    GFRB1 >60 05/06/2013    PHOS 3.1 11/27/2023     Latest CBC:   Lab Results   Component Value Date    WBCR 11.0 11/27/2023    RBC 4.57 11/27/2023    HGB 14.5 11/27/2023    HCT 42 11/27/2023    MCV 91 11/27/2023    PLTR 152 11/27/2023     Last Coagulation:   Lab Results   Component Value Date    PTI 12.8 11/27/2023    INR 1.1 11/27/2023    PTT 30.5 11/27/2023     Last cultures:    Lab Results   Component Value Date    RUR Gram  Positive flora  Colony Count: <10,000 cfu/ml 05/17/2011     CTA chest/abd/pelvis 11/24/23:    Impression:   1.  Type A aortic dissection extending from near the origin of the brachiocephalic artery through the left femoral artery. The aorta is aneurysmal at the descending thoracic aorta to 4.0 cm and 3.3 cm in the abdomen. The brachiocephalic artery and left  renal artery appeared to be supplied by the false lumen with questionable minimal relative decreased perfusion of the left kidney. Remainder of the major branches are supplied by the true lumen.  2.  Borderline dilated loop of small bowel within the left hemiabdomen without discrete transition point. Possibly representing an ileus, mild or intermittent obstruction not entirely excluded.    General: alert, awake, oriented x3; no acute distress  Heart:  RRR, normal s1s2; healed surgical scar on sternum and right chest wall  Lung:  clear to auscultation bilaterally, non labored breathing, no wheezes, rhonchi or rales  Abdomen:  soft, non tender, non distended, +bowel sounds, no peritoneal signs  Extremities: Aline in left radial artery; right radial pulse palpable. Grip strength 5/5. Sensation intact. Bilateral LEs with palpable DP and PT pulses. Motor and sensory function intact.     Assessment: 48 y.o. male with history of type A aortic dissection repair in 2021, stable dissection since 2022, presenting with left flank/back pain and severe HTN    Plan:   At this time we are not offering surgical intervention. It is not clear that his symptoms are directly related to his dissection since this has been stable for the last 3 years. The symptoms he came in with have been present for at least the last year. He would require complex endovascular repair and the risks outweigh the benefits at this time.     Can discontinue NPO status and advance diet.     Uncontrolled HTN- discussed with Dr. Geroldine and we can relax his parameters to keep SBP 130-140 so hopefully we  can transition him to an oral regimen. OK from our standpoint to resume home Lisinopril . Creatinine is normal and he has good UOP as the left renal is supplied by the false lumen    Patient with a history of musculoskeletal symptoms (left flank pain and back pain- OV Sept 2024) and prior spine evaluation by neurosurgeon who tried cortisone injections without relief. He has had an MRI of his lumbar spine a year ago. He has also tried PT. Will curbside Ortho Spine to look at imaging and if there is any further intervention or workup that would be recommended.     Continue pain control as needed. On nortriptyline  at home.   OOB as tolerated  PT if indicated  Bowel regimen     Appreciate SICU care       Valdez Robert PA-C  Vascular Surgery  Call Operator or use Pager [phone] M-F 6a-6p  Outside of these hours please call the surgical hospitalist

## 2023-11-28 LAB — UNMAPPED LAB RESULTS
ABO RH Blood Type (HT): O POS
Antibody Screen (HT): NEGATIVE
Basophil # (HT): 0 10 3/uL (ref 0.0–0.2)
Basophil % (HT): 0 % (ref 0–3)
Eosinophil # (HT): 0.1 10 3/uL (ref 0.0–0.6)
Eosinophil % (HT): 1 % (ref 0–5)
Hematocrit (HT): 44 % (ref 40–52)
Hemoglobin (HGB) (HT): 15 g/dL (ref 13.0–18.0)
Lymphocyte # (HT): 1.3 10 3/uL (ref 1.0–4.8)
Lymphocyte % (HT): 11 % — ABNORMAL LOW (ref 15–45)
MCHC (HT): 33.8 g/dL (ref 32.0–37.5)
MCV (HT): 93 fL (ref 80–100)
Mean Corpuscular Hemoglobin (MCH) (HT): 31.4 pg (ref 26.0–34.0)
Monocyte # (HT): 1 10 3/uL (ref 0.1–1.0)
Monocyte % (HT): 8 % (ref 0–15)
Neutrophil # (HT): 9.5 10 3/uL — ABNORMAL HIGH (ref 1.8–8.0)
Platelets (HT): 164 10 3/uL (ref 150–450)
RBC (HT): 4.77 10 6/uL (ref 4.40–6.20)
RDW (HT): 12.3 % (ref 0.0–15.2)
Seg Neut % (HT): 80 % — ABNORMAL HIGH (ref 45–75)
WBC (HT): 11.9 10 3/uL — ABNORMAL HIGH (ref 4.0–11.0)

## 2023-11-28 NOTE — Progress Notes (Signed)
 Vascular Surgery Progress Note     Patient fairly sleepy during my encounter.  He states that overall his pain is getting better.  He has been able to be out of bed and ambulating more and he states this is helping his back pain.  He currently denies any numbness/tingling in his legs.  He states that it gets worse when he is walking around. He is currently comfortable sitting in his chair.      Objective:   Blood pressure 104/56, pulse 72, temperature 37.1 C (98.8 F), temperature source Bladder, resp. rate 15, height 6' 2 (1.88 m), weight (!) 302 lb 0.5 oz (137 kg), SpO2 94%.  I/O last 3 completed shifts:  In: 966.3 [P.O.:300; I.V.:666.3]  Out: 2435 [Urine:2435]    Labs:  BMP:   Lab Results   Component Value Date    GLU 95 11/28/2023    NA 135 11/28/2023    K 4.4 11/28/2023    CO2 29 11/28/2023    BUN 17 11/28/2023    CREAT 0.8 11/28/2023    CA 8.8 11/28/2023    GFR1 >60 05/06/2013    GFRB1 >60 05/06/2013    PHOS 3.1 11/27/2023     Latest CBC:   Lab Results   Component Value Date    WBCR 11.9 (H) 11/28/2023    RBC 4.77 11/28/2023    HGB 15.0 11/28/2023    HCT 44 11/28/2023    MCV 93 11/28/2023    PLTR 164 11/28/2023       General: alert, awake, oriented x3; no acute distress  Heart:  RRR, normal s1s2; healed surgical scar on sternum and right chest wall  Lung:  clear to auscultation bilaterally, non labored breathing, no wheezes, rhonchi or rales  Abdomen:  soft, non tender, non distended, +bowel sounds, no peritoneal signs  Extremities: radial pulses palpable. Grip strength 5/5. Sensation intact. Bilateral LEs with palpable DP and PT pulses. Motor and sensory function intact.      Assessment: 48 y.o. male with history of type A aortic dissection repair in 2021, stable dissection since 2022, presenting with left flank/back pain and severe HTN     Plan:   At this time we are not offering surgical intervention. It is not clear that his symptoms are directly related to his dissection since this has been stable for  the last 3 years. The symptoms he came in with have been present for at least the last year. He would require complex endovascular repair and the risks outweigh the benefits at this time.      Tolerating diet at this time.     Uncontrolled HTN- relaxed parameters for SBP to 130-140. Able to stay within these limits on oral regimen. Amlodipine , Hydralazine, Labetalol , Lisinopril . Creatinine is normal and he has good UOP as the left renal is supplied by the false lumen     Patient with a history of musculoskeletal symptoms (left flank pain and back pain- OV Sept 2024) and prior spine evaluation by neurosurgeon who tried cortisone injections without relief. He has had an MRI of his lumbar spine a year ago. He has also tried PT. Will curbside Ortho Spine to look at imaging and if there is any further intervention or workup that would be recommended.      Continue multimodal pain control as needed. On nortriptyline  at home.   OOB as tolerated  PT/OT evals  Bowel regimen      Appreciate SICU care. OK for floor transfer once bed  available.         Tilden Robert PA-C  Vascular Surgery  Call Operator or use Pager [phone] M-F 6a-6p  Outside of these hours please call the surgical hospitalist

## 2023-11-29 LAB — UNMAPPED LAB RESULTS
Basophil # (HT): 0 10 3/uL (ref 0.0–0.2)
Basophil % (HT): 0 % (ref 0–3)
Eosinophil # (HT): 0.3 10 3/uL (ref 0.0–0.6)
Eosinophil % (HT): 3 % (ref 0–5)
Hematocrit (HT): 42 % (ref 40–52)
Hemoglobin (HGB) (HT): 14.2 g/dL (ref 13.0–18.0)
Lymphocyte # (HT): 1.6 10 3/uL (ref 1.0–4.8)
Lymphocyte % (HT): 18 % (ref 15–45)
MCHC (HT): 33.7 g/dL (ref 32.0–37.5)
MCV (HT): 92 fL (ref 80–100)
Mean Corpuscular Hemoglobin (MCH) (HT): 31.1 pg (ref 26.0–34.0)
Monocyte # (HT): 0.9 10 3/uL (ref 0.1–1.0)
Monocyte % (HT): 10 % (ref 0–15)
Neutrophil # (HT): 6.2 10 3/uL (ref 1.8–8.0)
Platelets (HT): 188 10 3/uL (ref 150–450)
RBC (HT): 4.56 10 6/uL (ref 4.40–6.20)
RDW (HT): 12.7 % (ref 0.0–15.2)
Seg Neut % (HT): 69 % (ref 45–75)
WBC (HT): 9 10 3/uL (ref 4.0–11.0)

## 2023-11-29 NOTE — Progress Notes (Addendum)
 Vascular Surgery Progress Note     Patient frustrated with his care this morning. He feels like he is being labeled as mean but he is in pain and does not want to be here.  He states that he was incontinent overnight because it was only the second time that he has voided since his Foley was removed yesterday morning and he did not feel like my muscles know what they are doing.     He states that he is having 10 out of 10 substernal chest pain that he describes as his typical acid reflux.  Tums did not help.  He tried Protonix  and that also did not help.  The pain does not radiate anywhere.    He denies any numbness, tingling or weakness of his upper or lower extremities.  He denies fevers, chills, shortness of breath or nausea/vomiting.  He has no abdominal or back pain at the current moment.    Objective:   Blood pressure (!) 159/80, pulse 83, temperature 36 C (96.8 F), temperature source Oral, resp. rate 20, height 6' 2 (1.88 m), weight (!) 302 lb 0.5 oz (137 kg), SpO2 92%.  I/O last 3 completed shifts:  In: 2296 [P.O.:2296]  Out: 131 [Urine:131]    Labs:  BMP:   Lab Results   Component Value Date    GLU 98 11/29/2023    NA 137 11/29/2023    K 3.8 11/29/2023    CO2 29 11/29/2023    BUN 18 11/29/2023    CREAT 0.8 11/29/2023    CA 8.6 11/29/2023    GFR1 >60 05/06/2013    GFRB1 >60 05/06/2013    PHOS 3.4 11/29/2023   Magnesium = 2.1  Latest cardiac markers:   Lab Results   Component Value Date    HSTN0 20 11/24/2023    HSTN1 22 11/24/2023    DELT1 2 11/24/2023     Latest CBC:   Lab Results   Component Value Date    WBCR 9.0 11/29/2023    RBC 4.56 11/29/2023    HGB 14.2 11/29/2023    HCT 42 11/29/2023    MCV 92 11/29/2023    PLTR 188 11/29/2023       General: alert, awake, oriented x3; no acute distress  Heart:  RRR, normal s1s2; healed surgical scar on sternum and right chest wall  Lung:  clear to auscultation bilaterally, non labored breathing, no wheezes, rhonchi or rales  Abdomen:  soft, non tender, non  distended, +bowel sounds, no peritoneal signs  Extremities: radial pulses palpable. Grip strength 5/5. Sensation intact. Bilateral LEs with palpable DP and PT pulses. Motor and sensory function intact.      Assessment: 48 y.o. male with history of type A aortic dissection repair in 2021, stable dissection since 2022, presenting with left flank/back pain and severe HTN       Plan:   -Acute chest pain- ddx GERD, ACS, aortic dissection. EKG appears stable from admission 5 days ago. Protonix  was ordered but did not offer any relief. SBP elevated >140 this morning. Recheck after AM cardiac meds given. Troponin ordered. Will discuss with attending if repeating CTA is warranted. Mg 2.1, K 3.8    -Hypertension- currently controlled on oral regimen of amlodipine , hydralazine, labetalol  and lisinopril . Goal SBP <140    -Chronic back pain- multimodal pain control. Ortho Spine looked at previous MRI and no surgical intervention warranted at this time. Recommend outpatient follow up for consideration of repeat imaging, possible pain management referral.  Patient scheduled to see Neurology as outpatient as well.     -Aortic Dissection- no surgical intervention is warranted at this time, pending chest pain workup.     -Tolerating diet, now able to void without issues  -PT and OT need to see him to evaluate for safe discharge home if chest pain workup negative/resolves.  -Bowel regimen  -CMS checks  -SubQ heparin gtt for DVT ppx  -Possible discharge pending chest pain workup      Tilden Robert PA-C  Vascular Surgery  Call Operator or use Pager [phone] M-F 6a-6p  Outside of these hours please call the surgical hospitalist       Addendum:  Patient seen and examined with Dr. Matilde. Patient states that his heartburn is now gone. He is frustrated because I told everyone it is heartburn, how did it get changed to chest pain. His EKG was stable and 1st troponin was 11. Patient is refusing 1hr troponin. We are still awaiting PT/OT  evals. His BP is around SBP 150 likely from anxiety/frustration. He removed his tele leads and is wanting to leave AMA.     I was in communication with the nurse and she informed me that he walked out without waiting to sign the AMA form and speak with me. Patient threatened to leave AMA yesterday and was counseled extensively on the risks so he was well aware.    [name], PA-C

## 2023-11-29 NOTE — Progress Notes (Signed)
 Patient left AMA from the hospital today. He needs a CTA in 4-6 weeks with outpatient follow up with Dr. Lydon. Will have our office reach out to patient to schedule.

## 2023-11-29 NOTE — Care Plan (Signed)
 Problem: Self-Care Deficit  Goal: Improved Ability to Complete Activities of Daily Living  Outcome: Progressing   Goal Outcome Evaluation:      Please see OT note for details.

## 2023-11-29 NOTE — Progress Notes (Signed)
-------------------------------------------------------------------------------    Attestation signed by Indivero, Katharina M, PA-C at 11/30/2023  6:20 AM  I have seen and examined the patient and reviewed the note written by the Medical Student.  I am in agreement with the history, physical findings, assessment and plan.    Tilden Robert PA-C  Vascular Surgery  Pager [phone] M-F 6a-6p  Outside of these hours please call the surgical hospitalist         -------------------------------------------------------------------------------    Vascular Surgery Medical Student Note:  Robert Valdez is a 48 year old male with a history of type A aortic dissection status post repair in 2021 that has been stable since 2022, lumbosacral radiculopathy at L5, and HTN presenting with intermittent back pain.     On approach, patient is in distress in his room. He is endorsing heart burn that is not relieved by Tums and is only relieved by milk. He does not describe any chest pain in his heart or palpitations, and says that it is just heart burn. Patient was provided with milk. He denies shortness of breath, numbness or tingling in his shoulder, jaw, chest, arms or legs. He says that he will be leaving at 9:00 AM today regardless of the plan to discharge. He endorses frustration with the level of care he was receiving on the floor compared to the surgical ICU. He had an episode of incontinence last night. He has palpable pulses in the bilateral upper extremities, palpable PT pulses bilaterally, and bilateral AT and PT pulses appreciated using doppler.    Physical Exam:  Vitals:    11/29/23 0314   BP: (!) 148/82   Pulse: 79   Resp: 20   Temp: 36.6 C (97.9 F)     No intake/output data recorded.    Card: Regular rate and rhythm, normal S1/S2, no murmurs noted  Lungs: Clear to auscultation bilaterally, normal effort, no wheezes, rales or rhonchi.  Abd: Soft and nontender, positive bowel sounds  Ext: Pulses present in bilateral upper and  lower extremities, mild edema in the bilateral feet/ankles. Motor and sensory function intact.    Labs:  Lab Results   Component Value Date    WBCR 9.0 11/29/2023    HGB 14.2 11/29/2023    HCT 42 11/29/2023    MCV 92 11/29/2023    PLTR 188 11/29/2023     Lab Results   Component Value Date    GLU 98 11/29/2023    CA 8.6 11/29/2023    NA 137 11/29/2023    K 3.8 11/29/2023    CL 101 11/29/2023    BUN 18 11/29/2023    CREAT 0.8 11/29/2023       A/P:    This is a 48 year old male with a history of type A aortic dissection status post repair in 2021 that has been stable, presenting with back pain and HTN.    Chest pain  Heart burn  Denies radiating chest pain or palpitations  Denies numbness or tingling in upper extremities, shoulder, jaw  Provided with milk and offered Tums    Plan:  Cardiac troponins  EKG    Back pain  Aortic dissection status/post repair 2021  Stable aortic dissection since 2022  No surgical intervention at this time  PT/OT following    HTN  Continue amlodipine , hydralazine, labetalol , lisinopril   Goal SBP <140    Leonor Heinrich, Medical Student

## 2023-11-30 ENCOUNTER — Observation Stay
Admission: EM | Admit: 2023-11-30 | Discharge: 2023-12-01 | Discharge status: Against Medical Advice | Source: Ambulatory Visit | Attending: Internal Medicine | Admitting: Internal Medicine

## 2023-11-30 ENCOUNTER — Emergency Department

## 2023-11-30 ENCOUNTER — Emergency Department: Admitting: Radiology

## 2023-11-30 ENCOUNTER — Other Ambulatory Visit: Payer: Self-pay

## 2023-11-30 ENCOUNTER — Encounter: Payer: Self-pay | Admitting: Emergency Medicine

## 2023-11-30 DIAGNOSIS — K760 Fatty (change of) liver, not elsewhere classified: Secondary | ICD-10-CM

## 2023-11-30 DIAGNOSIS — M5459 Other low back pain: Principal | ICD-10-CM | POA: Insufficient documentation

## 2023-11-30 DIAGNOSIS — M545 Low back pain, unspecified: Secondary | ICD-10-CM

## 2023-11-30 DIAGNOSIS — M549 Dorsalgia, unspecified: Principal | ICD-10-CM

## 2023-11-30 DIAGNOSIS — Z6836 Body mass index (BMI) 36.0-36.9, adult: Secondary | ICD-10-CM

## 2023-11-30 DIAGNOSIS — M5417 Radiculopathy, lumbosacral region: Secondary | ICD-10-CM | POA: Insufficient documentation

## 2023-11-30 DIAGNOSIS — I7100 Dissection of unspecified site of aorta: Secondary | ICD-10-CM

## 2023-11-30 DIAGNOSIS — Z9889 Other specified postprocedural states: Principal | ICD-10-CM

## 2023-11-30 DIAGNOSIS — G8929 Other chronic pain: Secondary | ICD-10-CM

## 2023-11-30 DIAGNOSIS — I7101 Dissection of ascending aorta: Secondary | ICD-10-CM | POA: Insufficient documentation

## 2023-11-30 DIAGNOSIS — Z5329 Procedure and treatment not carried out because of patient's decision for other reasons: Secondary | ICD-10-CM | POA: Insufficient documentation

## 2023-11-30 DIAGNOSIS — E66812 Obesity, class 2: Secondary | ICD-10-CM

## 2023-11-30 DIAGNOSIS — I7123 Aneurysm of the descending thoracic aorta, without rupture: Secondary | ICD-10-CM

## 2023-11-30 DIAGNOSIS — I2510 Atherosclerotic heart disease of native coronary artery without angina pectoris: Secondary | ICD-10-CM | POA: Insufficient documentation

## 2023-11-30 DIAGNOSIS — I10 Essential (primary) hypertension: Secondary | ICD-10-CM | POA: Insufficient documentation

## 2023-11-30 DIAGNOSIS — I7103 Dissection of thoracoabdominal aorta: Secondary | ICD-10-CM

## 2023-11-30 LAB — CBC AND DIFFERENTIAL
Baso # K/uL: 0 10*3/uL (ref 0.0–0.2)
Eos # K/uL: 0.2 10*3/uL (ref 0.0–0.5)
Hematocrit: 42 % (ref 37–52)
Hemoglobin: 14.6 g/dL (ref 12.0–17.0)
IMM Granulocytes #: 0 10*3/uL (ref 0.0–0.0)
IMM Granulocytes: 0.4 %
Lymph # K/uL: 1.5 10*3/uL (ref 1.0–5.0)
MCV: 91 fL (ref 75–100)
Mono # K/uL: 1.2 10*3/uL — ABNORMAL HIGH (ref 0.1–1.0)
Neut # K/uL: 6.2 10*3/uL (ref 1.5–6.5)
Nucl RBC # K/uL: 0 10*3/uL (ref 0.0–0.0)
Nucl RBC %: 0 /100{WBCs} (ref 0.0–0.2)
Platelets: 218 10*3/uL (ref 150–450)
RBC: 4.6 MIL/uL (ref 4.0–6.0)
RDW: 12.5 % (ref 0.0–15.0)
Seg Neut %: 68.3 %
WBC: 9.1 10*3/uL (ref 3.5–11.0)

## 2023-11-30 LAB — PROTIME-INR
INR: 1 (ref 0.9–1.1)
Protime: 11.9 s (ref 10.0–12.9)

## 2023-11-30 LAB — COMPREHENSIVE METABOLIC PANEL
ALT: 41 U/L (ref 0–50)
Albumin: 3.8 g/dL (ref 3.5–5.2)
Alk Phos: 85 U/L (ref 40–130)
Anion Gap: 11 (ref 7–16)
Bilirubin,Total: 0.4 mg/dL (ref 0.0–1.2)
CO2: 24 mmol/L (ref 20–28)
Calcium: 8.8 mg/dL (ref 8.6–10.2)
Chloride: 102 mmol/L (ref 96–108)
Creatinine: 0.75 mg/dL (ref 0.67–1.17)
Glucose: 84 mg/dL (ref 60–99)
Lab: 13 mg/dL (ref 6–20)
Sodium: 137 mmol/L (ref 133–145)
Total Protein: 6.7 g/dL (ref 6.3–7.7)
eGFR BY CREAT: 111 *

## 2023-11-30 LAB — VENOUS BLOOD GAS
Base Excess,VENOUS: 4 mmol/L — ABNORMAL HIGH (ref <=2)
Bicarbonate,VENOUS: 29 mmol/L — ABNORMAL HIGH (ref 21–28)
CO2 (Calc),VENOUS: 31 mmol/L (ref 22–31)
CO: 1.7 %
FO2 HB,VENOUS: 79 % (ref 63–83)
Hemoglobin: 13.6 g/dL (ref 12.0–17.0)
Methemoglobin: 1 % (ref 0.0–1.0)
PCO2,VENOUS: 47 mmHg (ref 40–50)
PH,VENOUS: 7.4 (ref 7.32–7.42)
PO2,VENOUS: 48 mmHg — ABNORMAL HIGH (ref 25–43)

## 2023-11-30 LAB — DRUG SCREEN CHEMICAL DEPENDENCY, URINE
Amphetamine,UR: NEGATIVE
Benzodiazepinen,UR: NEGATIVE
Cocaine/Metab,UR: NEGATIVE
Fentanyl, UR: NEGATIVE ng/mL
Opiates,UR: NEGATIVE
THC Metabolite,UR: NEGATIVE

## 2023-11-30 LAB — APTT: aPTT: 28.7 s (ref 25.8–37.9)

## 2023-11-30 LAB — HOLD LAVENDER

## 2023-11-30 LAB — TSH: TSH: 4.57 u[IU]/mL — ABNORMAL HIGH (ref 0.27–4.20)

## 2023-11-30 LAB — CK

## 2023-11-30 LAB — BLOOD BANK HOLD PINK

## 2023-11-30 LAB — POTASSIUM,PLASMA: Potassium,Plasma: 4 mmol/L (ref 3.3–4.6)

## 2023-11-30 LAB — HOLD BLUE

## 2023-11-30 LAB — LACTATE, VENOUS, WHOLE BLOOD: Lactate VEN,WB: 1 mmol/L (ref 0.5–2.2)

## 2023-11-30 LAB — MAGNESIUM: Magnesium: 2.1 mg/dL (ref 1.6–2.5)

## 2023-11-30 LAB — HOLD SST

## 2023-11-30 LAB — AST, PLASMA

## 2023-11-30 LAB — HEMOLYZED TEST

## 2023-11-30 LAB — HEMOLYZED SPECIMEN (GREEN TOP)

## 2023-11-30 LAB — NT-PRO BNP: NT-pro BNP: 482 pg/mL — ABNORMAL HIGH (ref 0–450)

## 2023-11-30 LAB — LACTATE, PLASMA: Lactate: 1.1 mmol/L (ref 0.5–2.2)

## 2023-11-30 LAB — HOLD GREEN NO GEL

## 2023-11-30 MED ORDER — LABETALOL HCL 100 MG PO TABS *I*
100.0000 mg | ORAL_TABLET | Freq: Once | ORAL | Status: AC
Start: 2023-11-30 — End: 2023-11-30
  Administered 2023-11-30: 100 mg via ORAL
  Filled 2023-11-30: qty 1

## 2023-11-30 MED ORDER — CYCLOBENZAPRINE HCL 10 MG PO TABS *I*
10.0000 mg | ORAL_TABLET | Freq: Three times a day (TID) | ORAL | Status: DC | PRN
Start: 2023-11-30 — End: 2024-01-29
  Administered 2023-11-30 – 2023-12-01 (×2): 10 mg via ORAL
  Filled 2023-11-30 (×2): qty 1

## 2023-11-30 MED ORDER — TADALAFIL 5 MG PO TABS *I*
5.0000 mg | ORAL_TABLET | Freq: Every evening | ORAL | Status: DC
Start: 2023-11-30 — End: 2024-01-29
  Administered 2023-11-30: 5 mg via ORAL
  Filled 2023-11-30 (×4): qty 1

## 2023-11-30 MED ORDER — ACETAMINOPHEN 325 MG PO TABS *I*
325.0000 mg | ORAL_TABLET | Freq: Once | ORAL | Status: AC
Start: 2023-11-30 — End: 2023-11-30
  Administered 2023-11-30: 325 mg via ORAL
  Filled 2023-11-30: qty 1

## 2023-11-30 MED ORDER — LABETALOL HCL 20 MG/4 ML IV SOLN WRAPPED *I*
10.0000 mg | INTRAVENOUS | Status: DC | PRN
Start: 2023-11-30 — End: 2023-12-02
  Administered 2023-11-30: 10 mg via INTRAVENOUS
  Filled 2023-11-30: qty 4

## 2023-11-30 MED ORDER — DIAZEPAM 2 MG PO TABS *I*
2.0000 mg | ORAL_TABLET | Freq: Once | ORAL | Status: DC
Start: 2023-11-30 — End: 2023-11-30

## 2023-11-30 MED ORDER — LISINOPRIL 40 MG PO TABS *I*
40.0000 mg | ORAL_TABLET | Freq: Every day | ORAL | Status: DC
Start: 2023-11-30 — End: 2024-01-29
  Administered 2023-11-30: 40 mg via ORAL
  Filled 2023-11-30: qty 1
  Filled 2023-11-30: qty 4

## 2023-11-30 MED ORDER — SODIUM CHLORIDE 0.9 % FLUSH FOR PUMPS *I*
0.0000 mL/h | INTRAVENOUS | Status: DC | PRN
Start: 2023-11-30 — End: 2023-11-30

## 2023-11-30 MED ORDER — ESMOLOL HCL 20 MG/ML IV SOLN *WRAPPED*
25.0000 ug/kg/min | INTRAVENOUS | Status: DC
Start: 2023-11-30 — End: 2023-11-30
  Administered 2023-11-30: 50 ug/kg/min via INTRAVENOUS
  Administered 2023-11-30: 100 ug/kg/min via INTRAVENOUS
  Filled 2023-11-30: qty 300

## 2023-11-30 MED ORDER — LIDOCAINE 4 % EX PATCH *I*
2.0000 | MEDICATED_PATCH | CUTANEOUS | Status: DC
Start: 2023-11-30 — End: 2023-12-01
  Administered 2023-11-30: 2 via TRANSDERMAL
  Filled 2023-11-30: qty 2

## 2023-11-30 MED ORDER — IOHEXOL 350 MG/ML (OMNIPAQUE) IV SOLN 500ML BOTTLE *I*
1.0000 mL | Freq: Once | INTRAVENOUS | Status: AC
Start: 2023-11-30 — End: 2023-11-30
  Administered 2023-11-30: 149 mL via INTRAVENOUS

## 2023-11-30 MED ORDER — ACETAMINOPHEN 500 MG PO TABS *I*
1000.0000 mg | ORAL_TABLET | Freq: Three times a day (TID) | ORAL | Status: DC | PRN
Start: 2023-11-30 — End: 2023-12-30
  Administered 2023-11-30 – 2023-12-01 (×2): 1000 mg via ORAL
  Filled 2023-11-30 (×2): qty 2

## 2023-11-30 MED ORDER — HYDROMORPHONE HCL PF 1 MG/ML IJ SOLN *WRAPPED*
0.5000 mg | Freq: Once | INTRAMUSCULAR | Status: AC
Start: 2023-11-30 — End: 2023-11-30
  Administered 2023-11-30: 0.5 mg via INTRAVENOUS
  Filled 2023-11-30: qty 0.5

## 2023-11-30 MED ORDER — METOPROLOL TARTRATE 25 MG PO TABS *I*
25.0000 mg | ORAL_TABLET | Freq: Four times a day (QID) | ORAL | Status: DC
Start: 2023-11-30 — End: 2023-11-30
  Administered 2023-11-30: 25 mg via ORAL
  Filled 2023-11-30: qty 1

## 2023-11-30 MED ORDER — CHLORTHALIDONE 25 MG PO TABS *I*
25.0000 mg | ORAL_TABLET | Freq: Every morning | ORAL | Status: DC
Start: 2023-12-01 — End: 2024-01-30
  Filled 2023-11-30: qty 1

## 2023-11-30 MED ORDER — DEXTROSE 5 % FLUSH FOR PUMPS *I*
0.0000 mL/h | INTRAVENOUS | Status: DC | PRN
Start: 2023-11-30 — End: 2023-11-30

## 2023-11-30 MED ORDER — LABETALOL HCL 200 MG PO TABS *I*
300.0000 mg | ORAL_TABLET | Freq: Two times a day (BID) | ORAL | Status: DC
Start: 2023-12-01 — End: 2024-01-29
  Filled 2023-11-30 (×2): qty 1

## 2023-11-30 MED ORDER — SODIUM CHLORIDE 0.9 % FLUSH FOR PUMPS *I*
0.0000 mL/h | INTRAVENOUS | Status: DC | PRN
Start: 2023-11-30 — End: 2023-12-01

## 2023-11-30 MED ORDER — AMLODIPINE BESYLATE 10 MG PO TABS *I*
10.0000 mg | ORAL_TABLET | Freq: Every day | ORAL | Status: DC
Start: 2023-11-30 — End: 2024-01-29
  Administered 2023-11-30 – 2023-12-01 (×2): 10 mg via ORAL
  Filled 2023-11-30 (×2): qty 1

## 2023-11-30 MED ORDER — DEXTROSE 5 % FLUSH FOR PUMPS *I*
0.0000 mL/h | INTRAVENOUS | Status: DC | PRN
Start: 2023-11-30 — End: 2024-01-29

## 2023-11-30 MED ORDER — OXYCODONE HCL 5 MG PO TABS *I*
5.0000 mg | ORAL_TABLET | Freq: Once | ORAL | Status: AC
Start: 2023-11-30 — End: 2023-11-30
  Administered 2023-11-30: 5 mg via ORAL
  Filled 2023-11-30: qty 1

## 2023-11-30 NOTE — ED Triage Notes (Addendum)
 Lower back pain for 3 years, increased pain in the last two weeks. Pain radiates down to legs, causing alternating numbness in legs. Denies any new recent trauma.       Prehospital medications given: No

## 2023-11-30 NOTE — Progress Notes (Addendum)
 Patient evaluated at bedside for ongoing low back pain. States it's a sharp shooting pain. States it's been ongoing for last few hours. Not changed in nature from home chronic back pain. At home has tried multiple different positions - laying on floor, sitting in a chair. At times this pain, keeps him up all night.    On exam: Resting with eyes closed. Easily awoken to voice. No acute painful or respiratory distress. Speaking full sentences comfortably.    Current regimen reviewed - including Tylenol , Lidocaine , and Flexeril. NSAIDs avoided given concern for hypertension and known stable Type B aortic dissection.    Denies any new numbness, bowel/bladder incontinence. Denies any change in nature of pain. States he's had similar fluctuations to this type of pain at home.  Declining additional interventions such as hot/cold pack, repositioning to a chair. Will be due for additional PRNs soon.    Requesting additional medication. When asked what he tries for pain at home, replies "I get some meds on the down low" and on further clarification respond "I get oxy from a friend". Advised him that for treatment of chronic pain, medications of this class are not appropriate as there can be a rebound worsening effect and tolerance.    Stated "they gave me pain meds earlier and if I can't get more, why am I here when I can do this at home and I'm paying to be here". Again advised that opioids are not indicated for treatment of chronic pain and other explanation. Advised that would recommend day team consult chronic pain service to further investigate and offer recommendations.    Became visibly frustrated, answering phone call, and with expletives asking examiner to leave.    ISTOP reviewed. Reference #: [id number].    No recent prescriptions for controlled substances.

## 2023-11-30 NOTE — Bed Hold Note (Signed)
 Bed: G-16H02  Expected date:   Expected time:   Means of arrival:   Comments:  Khyri, Barco

## 2023-11-30 NOTE — Provider Consult (Signed)
 Vascular Surgery Consult H & P    Consult Reason:   Back pain    Requested By:   Emergency department    HPI:   Robert Valdez is a 48 y.o. male with a PMHx significant for type aortic dissection s/p open repair in 2021, stable dissection since 2022?,  Chronic back and left hip pain who presented to [facility] on 5/2 with worsening left flank/back pain and severe HTN placed on impulse therapy and placed within goal on p.o. antihypertensives and left AMA from [facility] after being frustrated with the care he was receiving.    Which leaving AMA he he now presents to [facility] for increasing and radiating pain down to the legs with alternating numbness.  Patient reports he has had this pain for ~3 years, ever since his cardiac surgery. Reports the pain is in the middle of his back and radiates down both legs. Associated with numbness and tingling in the legs. Reports the pain is worst when he is immobile and can improve with movement. He is never without the pain. When the pain gets especially severe, even walking is too painful. Over the past 3-4 months, this back pain has been getting more severe and debilitating.   No chest or abdominal pain.     REVIEW OF SYSTEMS  ROS    MEDICAL HISTORY  Past Medical History:   Diagnosis Date    Coronary artery disease     Hypertension     Obesity     Psychiatric disorder        SURGICAL HISTORY  Past Surgical History:   Procedure Laterality Date    CHOLECYSTECTOMY      wrist surgery         FAMILY HISTORY  Family History   Problem Relation Age of Onset    Migraines Mother         SOCIAL HISTORY   reports that he quit smoking about 10 years ago. His smoking use included cigarettes. He has never used smokeless tobacco. He reports that he does not drink alcohol and does not use drugs.     ALLERGIES  Shellfish-derived products     MEDICATIONS  Current Facility-Administered Medications   Medication    sodium chloride  0.9 % FLUSH REQUIRED IF PATIENT HAS IV    dextrose 5 % FLUSH REQUIRED IF  PATIENT HAS IV    metoprolol  tartrate (LOPRESSOR ) tablet 25 mg     Current Outpatient Medications   Medication Sig    cloNIDine  (CATAPRES ) 0.3 mg tablet take 1 tablet by mouth twice a day    labetalol  (NORMODYNE ) 300 mg tablet take 1 tablet by mouth twice a day    GABAPENTIN  300 MG capsule take 1 capsule by mouth twice a day    amLODIPine  (NORVASC ) 5 mg tablet Take 1 tablet (5 mg total) by mouth daily    lisinopril  (PRINIVIL ,ZESTRIL ) 20 mg tablet Take 1 tablet (20 mg total) by mouth daily    pantoprazole  (PROTONIX ) 40 mg EC tablet Take 40 mg by mouth daily  Swallow whole. Do not crush, break, or chew.        Objective:      BP: (156-178)/(84-94)   Temp:  [35.6 C (96.1 F)-36.8 C (98.2 F)]   Temp src: Temporal (05/08 1247)  Heart Rate:  [67-94]   Resp:  [13-26]   SpO2:  [93 %-98 %]   Height:  [188 cm (6\' 2" )]   Weight:  [129.3 kg (285 lb)]  Intake/Output  No intake/output data recorded.    CMP  Recent Labs   Lab 11/30/23  1226 11/30/23  1127   Sodium  --  137   Potassium  --  CANCELED   Chloride  --  102   CO2  --  24   Creatinine  --  0.75   ALT  --  41   AST  --  CANCELED   Bilirubin,Total  --  0.4   Lactate 1.1  --      No components found with this basename: "BUN", "LABGLOM", "CALCIUM "      Coags  Recent Labs   Lab 11/30/23  1127   INR 1.0   aPTT 28.7       CBC  Recent Labs   Lab 11/30/23  1226 11/30/23  1127   WBC  --  9.1   Hemoglobin 13.6 14.6   Hematocrit  --  42   Platelets  --  218       Imaging:  CTA reviewed and prior imaging from 2022. Stable appearance to residual type B dissection. Appears to have been just an ascending replacement with the arch left untouched and still with residual dissection.     Physical Exam:      General appearance: alert, moderate distress, cooperative, and very uncomfortable appearing  HEENT: extra ocular movement intact and sclera clear, anicteric  CV: regular rate and rhythm  Lungs: breathing comfortably on room air  Abd: soft, non-tender. Bowel sounds normal. No  masses,  no organomegaly   Extremities:  Palpable radial, femoral and DP pulses bilaterally  Neuro: normal without focal findings, mental status, speech normal, alert and oriented x3, and PERLA    Assessment:   Robert Valdez is a 48 y.o. male with prior type A dissection s/p ascending repair with residual zone 1-10L dissection who presents with back pain. Back pain sounds more radicular by history. It does not sound like pain associated from his residual dissection, especially given the stable appearance on imaging. His dissection does not currently meet any criteria for repair, and were it to, it would require an arch repair by cardiac surgery, there is no endovascular intervention that we could perform for it currently.   To prevent aneurysmal degeneration, blood pressure control is very important. We would recommend that he attempt to maintain a systolic blood pressure of <135mmHg chronically. This does not need to be achieved immediately.     Plan:     Stable chronic residual type B dissection without indication for intervention  Likely unrelated back pain  Recommend better chronic blood pressure control of systolic <140 mmHg to delay any aneurysmal degeneration of his dissection, this can be achieved as an outpatient if he has a primary care physician he can follow up with, would just try to ensure patient is not in the 180's on discharge  Defer work up of back pain to emergency department  Will arrange outpatient follow up with imaging in 1 year for patient, if he wishes to follow with [facility] vascular surgery      Please page the vascular surgery resident on call for any questions.    Derwin Floor, MD  Vascular Surgery  11/30/2023 at 3:53 PM

## 2023-11-30 NOTE — H&P (Addendum)
 Hospital Medicine H&P    Chief Complaint: back pain    HPI:  Robert Valdez is a 48 y.o. male with PMH of CAD, HTN, aortic dissection s/p repair in 2021 in [place]  who is presenting to the ED with back pain.   He notes that he presented to [facility] ED on 11/24/23 due to worsening of the back pain. He had CTA chest, abdomen, and pelvis and this was concerning for Type A dissection and he was eventually sent to the [facility] ED. He was admitted to [facility] hypertensive emergency and placed on a cardene and esmolol gtt. The plan at the time was blood pressure control and looks like there were plans for potential OR intervention, but the surgery team eventually decided against it as they did not believe his pain was 2/2 his dissection. Patient left AMA yesterday and presented here for worsening pain.     On my evaluation, patient notes that he has been having low back pain for the past 4 years.  Pain is located across his entire lumbar spine, it is not localized to 1 area.  Notes that the pain is sharp and constant.  He does not believe the pain radiates into his extremities, however he does note that he has some numbness and tingling sensations that go into his lower extremities.  Currently states he has numbness on the dorsal aspect of the right foot and throughout the left leg diffusely.  This pain and numbness is not new, this is what he has been dealing with, and it has slowly been worsening over the last few years.  Besides the back pain and numbness, he has no other complaints.  He denies chest pain, shortness of breath, headache, dizziness, pain into the neck or shoulders, abdominal pain, nausea, vomiting, diarrhea.  No bladder or bowel incontinence.  No saddle anesthesia.    He has been following with his PCP and orthopedic surgery for this pain.  In July 2024 he underwent epidural steroid injection which actually worsened his pain according to him.  Has an appoint with neurology coming up.   Underwent MRI of the lumbar spine on 12/08/2022 which showed mild central canal stenosis at L4-L5 and marked bilateral neural foraminal stenosis at L5-S1.  He does not check his BP at home, but reports compliance with his meds. Notes he does snore, and his PCP has mentioned to him about getting a sleep study.       ED course:  Hypertensive 160s/90s, vitals otherwise stable  Electrolytes and renal function wnl. CBC wnl  Was on an esmolol gtt briefly, got 0.5 of dilaudid , 5 mg oxycodone , and 100 mg PO labetalol    CTA CAP shows type A aortic dissection extending from near the origin of the brachiocephalic artery through the left femoral artery. The aorta is aneurysmal at the descending thoracic aorta to 4.1 cm The brachiocephalic artery is likely supplied by the false lumen. Remainder of the major branches are supplied by the true lumen.    Relevant Family/Social history:  Social History[1]    Family History   Problem Relation Age of Onset    Migraines Mother          ROS:   Review of Systems   Constitutional:  Negative for chills and fever.   Respiratory:  Negative for cough and shortness of breath.    Cardiovascular:  Negative for chest pain.   Gastrointestinal:  Negative for abdominal pain, diarrhea, nausea and vomiting.   Musculoskeletal:  Positive  for back pain.   Neurological:  Positive for tingling (in the lower extremities).   All other systems reviewed and are negative.      Past Medical History:  Past Medical History:   Diagnosis Date    Coronary artery disease     Hypertension     Obesity     Psychiatric disorder        Past Surgical History:   Procedure Laterality Date    CHOLECYSTECTOMY      wrist surgery         Allergy History as of 11/30/23       SHELLFISH-DERIVED PRODUCTS         Noted Status Severity Type Reaction    07/11/13 1833 Rowe Coots, RN 07/11/13 Active High Allergy Anaphylaxis                    Medications:  Medications Ordered Prior to Encounter[2]    Physical Exam:    Intake/Output  Summary (Last 24 hours) at 11/30/2023 1705  Last data filed at 11/30/2023 1623  Gross per 24 hour   Intake 25.43 ml   Output 800 ml   Net -774.57 ml     Vitals:    11/30/23 1301 11/30/23 1324 11/30/23 1400 11/30/23 1614   BP: (!) 163/93 (!) 159/93 (!) 156/94 162/90   BP Location:    Right arm   Pulse: 67 72 78 67   Resp: 23 (!) 26 13 21    Temp:    36.6 C (97.9 F)   TempSrc:       SpO2: 93% 98% 97% 98%   Weight:       Height:           General: No acute distress, non-toxic in appearance. Alert and awake.  HEENT: Atraumatic, normocephalic. PERRLA. Moist mucus membranes.  Cardiac: Regular rate and rhythm. No murmurs  Respiratory: CTAB, no respiratory distress  Abdominal: Obese, soft, non-tender,   MSK/Extremities: No peripheral edema. 2+ DP, PT, and radial pulses. Paraspinal musculature tenderness in the lumbar region. No midline C/T/L spine ttp. Straight leg raise aggravates pain but does not cause radicular symptoms   Skin: Warm and well perfused. No rashes or wounds to exposed skin.  Neuro/Psych: A&O x3. Moving all extremities spontaneously. Normal affect. CN II-XII normal. 5/5 strength in all 4s. Some decreased sensation to light touch. 2+ patellar reflexes    Radiology:   CT angio abdomen and pelvis  Result Date: 11/30/2023  Aortic dissection extends from near the origin of the brachiocephalic artery into the left common and external iliac arteries and terminating within the proximal left femoral artery. The infrarenal abdominal aorta is aneurysmal measuring up to 3.2 cm. The left renal artery appears to be supplied by the false lumen with associated slightly delayed left nephrogram/decreased attenuation of the left renal parenchyma. Otherwise, the major aortic branch vasculature is supplied by the true lumen. Mild skin thickening and subcutaneous stranding of the right groin which may be due to inflammation or infection. See separate chest report for a full discussion of findings above the diaphragm. END OF  IMPRESSION     CT angio chest  Result Date: 11/30/2023  Aorta: Type A aortic dissection extending from near the origin of the brachiocephalic artery through the left femoral artery. The aorta is aneurysmal at the descending thoracic aorta to 4.1 cm The brachiocephalic artery is likely supplied by the false lumen. Remainder of the major branches are supplied by the true  lumen. Findings appears similar to a radiology report from 11/24/2023, however images related to this report are not available at the time of the dictation of this report. Recommend upload of images to PACS and comparison with this prior CT study to document stability of the above findings. Pulmonary Arteries: No evidence of pulmonary embolism. Please see separately dictated report of concurrently acquired CT abdomen and pelvis for findings below the diaphragm. END OF IMPRESSION         Assessment and Plan:         Robert Valdez is a 48 y.o. male with a PMH significant for Type A aortic dissection s/p repair in 2021, HTN, who presented to the ED on 5/2 for back pain and LE pain, CTA showed dissection, and he was admitted to [facility] SICU for hypertensive emergency. Left AMA and now at [facility] for continued back pain    #Back pain  #Spinal Stenosis with numbness  - Chronic back pain, with known canal stenosis at L4/L5 and L5/S1. With his dermatomal symptoms an numbness in the dorsum of the foot likely a radicular component.  - Does have some paraspinal musculature tenderness  - tylenol /toradol /lidocaine  patch/flexeril   - will need followup with Orthopedic surgery     #HTN  - on labetalol , chlorthalidone , lisinopril , amlodipine  at home  - will continue home meds   - goal SBP < 160 today with eventual goal <140  - will add PRNs for > 160  -likely has OSA, recommended outpatient sleep study as this could be contributing to his HTN  -unclear if he has had resistant HTN workup, does not appear so in the notes, but can touch base with his PCP in AM (see if he  has had renin/aldo/Renal US ) but likely OSA largest culprit    #Type A aortic dissection, s/p repair in 2021  -  type A aortic dissection extending from near the origin of the brachiocephalic artery through the left common and external iliac arteries and terminating within the proximal left femoral artery. The infrarenal abdominal aorta is aneurysmal measuring up to 3.2 cm.  The aorta is aneurysmal at the descending thoracic aorta to 4.1 cm  - SBP < 160  - cardiac surgery and vascular surgery consulted, no acute intervention at this time  - will follow up with those teams regarding surveillance imaging in the future     Hospital Care      CODE STATUS: Full code   Medical management  Lines/tubes: PIV x2  Diet: regular  DVT PPx: lovenox   GI Ppx: none  Bowel regimen: miralax prn  Pain: multimodal Disposition  PT/OT:   Barriers to discharge:   Expected duration to D/C:   Follow-up needed:          Audelia Blazer, MD,   Anesthesiology PGY1  11/30/23 5:05 PM         [1]   Social History  Tobacco Use    Smoking status: Former     Types: Cigarettes     Quit date: 06/21/2013     Years since quitting: 10.4    Smokeless tobacco: Never   Substance Use Topics    Alcohol use: No     Comment: none in 3 weeks-previously every weekend    Drug use: No   [2]   No current facility-administered medications on file prior to encounter.     Current Outpatient Medications on File Prior to Encounter   Medication Sig Dispense Refill    chlorthalidone  25  mg tablet Take 1 tablet (25 mg total) by mouth every morning.      labetalol  (NORMODYNE ) 300 mg tablet take 1 tablet by mouth twice a day 90 tablet 3    amLODIPine  (NORVASC ) 5 mg tablet Take 1 tablet (5 mg total) by mouth daily (Patient taking differently: Take 2 tablets (10 mg total) by mouth daily for High Blood Pressure.) 90 tablet 3    lisinopril  (PRINIVIL ,ZESTRIL ) 20 mg tablet Take 1 tablet (20 mg total) by mouth daily (Patient taking differently: Take 2 tablets (40 mg total) by mouth  daily.) 90 tablet 3    tadalafil 5 MG tablet Take 1 tablet (5 mg total) by mouth nightly.      cloNIDine  (CATAPRES ) 0.3 mg tablet take 1 tablet by mouth twice a day 90 tablet 3    GABAPENTIN  300 MG capsule take 1 capsule by mouth twice a day 30 capsule 1    pantoprazole  (PROTONIX ) 40 mg EC tablet Take 1 tablet (40 mg total) by mouth daily. Swallow whole. Do not crush, break, or chew.

## 2023-11-30 NOTE — ED Provider Notes (Addendum)
 History     Chief Complaint   Patient presents with    Back Pain     48 year old male with past medical history of CAD, HTN, aortic dissection status post repair in 2021 in [place]  presents to the emergency department complaining of back pain that radiates to bilateral legs.  Patient states he has had back pain that radiates to bilateral legs ever since his aortic dissection repair in 2021.  Patient states it has gotten progressively worse.  Patient states he had a MRI of his spine about 3-4 months ago.  Patient states he also got epidural injections for pain about 3-4 months ago.  Patient was having worsening pain, so he went to [facility] ED on 5/2.  Patient had a CTA of his chest, abdomen, and pelvis at that time that was concerning for a type a aortic dissection.  Patient was then sent to [facility].  Per chart review, they initiated blood pressure control and were planning on taking him to the OR.  However he never went to the OR.  Patient ended up leaving AMA yesterday.  Due to continued pain, patient presented to [facility] ED today.  Patient denies chest pain, shortness of breath, abdominal pain, incontinence, urinary symptoms, urinary retention, paresthesias, saddle anesthesia, trauma, fall, fever, chills, nausea, vomiting.      History provided by:  Patient  Language interpreter used: No          Medical/Surgical/Family History     Past Medical History:   Diagnosis Date    Coronary artery disease     Hypertension     Obesity     Psychiatric disorder         Patient Active Problem List   Diagnosis Code    Obesity E66.9    Psychiatric disorder F99    Hypertension I10    Aortic dissection, thoracoabdominal I71.03    LVH (left ventricular hypertrophy) I51.7    S/P aortic dissection repair Z61.096            Past Surgical History:   Procedure Laterality Date    CHOLECYSTECTOMY      wrist surgery            Social History[1]          Review of Systems    Physical Exam     Triage Vitals  Triage Start: Start, (11/30/23  0454)  First Recorded BP: 159/84, Resp: 20, Temp: 35.6 C (96.1 F), Temp src: TEMPORAL Oxygen Therapy SpO2: 98 %, Oximetry Source: Lt Hand, O2 Device: None (Room air), Heart Rate: 94, (11/30/23 0950)  .  First Pain Reported  0-10 Scale: 10, (11/30/23 0981)       Physical Exam  Vitals and nursing note reviewed.   Constitutional:       General: He is not in acute distress.     Appearance: He is not toxic-appearing or diaphoretic.   HENT:      Head: Normocephalic and atraumatic.   Cardiovascular:      Rate and Rhythm: Normal rate and regular rhythm.      Pulses:           Radial pulses are 2+ on the right side and 2+ on the left side.        Dorsalis pedis pulses are 2+ on the right side and 2+ on the left side.        Posterior tibial pulses are 2+ on the right side and 2+ on the left side.  Heart sounds: Normal heart sounds. No murmur heard.     No friction rub. No gallop.   Pulmonary:      Effort: Pulmonary effort is normal. No respiratory distress.      Breath sounds: Normal breath sounds. No wheezing, rhonchi or rales.   Abdominal:      General: Bowel sounds are normal. There is no distension.      Palpations: Abdomen is soft. There is no mass.      Tenderness: There is no abdominal tenderness. There is no right CVA tenderness, left CVA tenderness, guarding or rebound.   Musculoskeletal:         General: Normal range of motion.      Cervical back: Normal range of motion and neck supple. No rigidity or tenderness. No spinous process tenderness or muscular tenderness.      Comments: Back: No erythema or ecchymosis, tenderness to palpation of lumbar spine and left low back, no thoracic spine tenderness, full range of motion, muscle strength +5/5 and equal bilateral upper and lower extremities, neurovascularly intact   Skin:     General: Skin is warm and dry.   Neurological:      General: No focal deficit present.      Mental Status: He is alert and oriented to person, place, and time.      Sensory: No sensory  deficit.      Motor: No weakness.      Coordination: Coordination normal.      Gait: Gait normal.   Psychiatric:         Mood and Affect: Mood normal.         Behavior: Behavior normal.         Thought Content: Thought content normal.         Judgment: Judgment normal.         Medical Decision Making   Patient seen by me on:  11/30/2023    Assessment:  48 year old male with past medical history of CAD, HTN, aortic dissection status post repair in 2021 in [place]  presents to the emergency department complaining of back pain that radiates to bilateral legs.  Patient states he has had back pain that radiates to bilateral legs ever since his aortic dissection repair in 2021.  Patient states it has gotten progressively worse.  Patient states he had a MRI of his spine about 3-4 months ago.  Patient states he also got epidural injections for pain about 3-4 months ago.  Patient was having worsening pain, so he went to [facility] ED on 5/2.  Patient had a CTA of his chest, abdomen, and pelvis at that time that was concerning for a type a aortic dissection.  Patient was then sent to [facility].  Per chart review, they initiated blood pressure control and were planning on taking him to the OR.  However he never went to the OR.  Patient ended up leaving AMA yesterday.  Due to continued pain, patient presented to [facility] ED today.     Differential diagnosis:  Aortic dissection  Musculoskeletal etiology of back pain  Lumbar paraspinal muscle strain  Muscle spasms of back  Degenerative disc disease of spine  Herniated disc  Unlikely nephrolithiasis, cord compression, or cauda equina syndrome.    Plan:  Orders Placed This Encounter      CT angio chest      CT angio abdomen and pelvis      APTT      Basic metabolic panel  CBC and differential      Protime-INR      Diet NPO effective now      Type and screen      Insert peripheral IV      sodium chloride  0.9 % FLUSH REQUIRED IF PATIENT HAS IV      dextrose  5 % FLUSH REQUIRED IF PATIENT HAS  IV      acetaminophen  (TYLENOL ) tablet 325 mg      oxyCODONE  (ROXICODONE ) immediate release tablet 5 mg        Will bring patient directly to CT scanner.  Will consult vascular surgery.    ED Course and Disposition:  11:31 Patient brought directly to CT scanner after peripheral IV was placed and he was given pain medicine.  Vascular surgery was consulted.  Vascular surgery wants to start esmolol  drip.  Patient to be moved to the critical care bay.  Care transferred to Dr. [name].        ED Course as of 12/01/23 1239   Thu Nov 30, 2023   1336 Does not need esmolol , needs PO meds for BP<140/80 and HR in 70s. CT surgery eval for fixing Type A part       Jerrye Mori [place], [place]      APP Review:    I had face-to-face interaction with the patient on 11/30/2023.  I have reviewed and agree with the above documentation. Any changes have been made above or are included below. I personally made/approved the management plan and take responsibility for the patient management.    MDM: 53 male presenting for low back pain.  States its acute on chronic in nature, midline, radiating down the left side.  Denies changes in bowel or bladder habits.  Denies saddle anesthesia.  Denies history of IV drug use.  Does have a history of type and type B dissection for which she was at [facility] recently and left AMA due to not feeling her for his low back pain.  Imaging at strong notable for type a and type B dissection.  Patient was evaluated at bedside by vascular team who recommends no acute surgical intervention blood pressure monitoring at this time along with goals of less than 140 systolic and heart rate in the 70s.  In addition of this the patient was seen by cardiothoracic surgery who recommend medical management of these type a dissection.  The patient is pending admission for blood pressure monitoring control.  Will be signed out to the oncoming EM physician pending this.  He was stable at time of signout.    Critical Care    There  is a high probability of imminent or life threatening deterioration due to circulatory failure.    Acute interventions include discussions w/other provider, documenting the case, initial hx & physical exam, order & perform tx & interventions, obtaining hx from pt or surrogate, order & review of radiology studies, re-eval of pt's condition, review of old charts and order & review of lab studies.    I personally spent 30 cumulative minutes performing critical care interventions to this patient as outlined above. This excludes separately billable procedures.       Author:  [name], DO           Fred Jacobsen [place], [place]  11/30/23 1209       [1]   Social History  Tobacco Use    Smoking status: Former     Types: Cigarettes     Quit date: 06/21/2013  Years since quitting: 10.4    Smokeless tobacco: Never   Substance Use Topics    Alcohol use: No     Comment: none in 3 weeks-previously every weekend    Drug use: No        [name], DO  12/01/23 1240

## 2023-11-30 NOTE — Provider Consult (Signed)
 Cardiac Surgery Consult/H&P:  Referring: Robert Soda, DO  Consultant: Dr. Liddie Reel  CC/Consult question: Type A and B chronic dissection    History of Present Illness:  Robert Valdez is a 48 y.o. male with CAD, HTN, aortic dissection s/p reapir in 2021 in [place]  who is presenting to the ED with back pain radiating to bilateral lower extermities. He notes that he has had back pain since the repair of the dissection in 2021. He also notes that it has progressively been getting worse during this time.     He notes that he presented to [facility] ED on 11/24/23 due to worsening of the back pain and lower extremities. He had CTA chest, abdomen, and pelvis and this was concerning and he was eventually sent to the [facility]. The plan at the time was blood pressure control and looks like there were plans for potential OR intervention. Patient left AMA yesterday and presented here for worsening pain. He currently has no chest pain, shortness of breath, abdominal pain. Vascular surgery was consulted to evaluate patient for the pain.     Of note he had repeated image here which notes similar finding form images on 5/2 with Type A dissection extending near the origin of the brachiocephalic artery and extending to the left femoral artery. He is also noted to have descending thoracic aorta of 4.1 cm.     Cardiac surgery was consulted for potential future intervention of the Type A dissection. He currently does not have any chest pain or shortness of breath.     Past Medical & Surgical History:  Past Medical History:   Diagnosis Date    Coronary artery disease     Hypertension     Obesity     Psychiatric disorder      Past Surgical History:   Procedure Laterality Date    CHOLECYSTECTOMY      wrist surgery       Allergies[1]  Family  History:  Family History   Problem Relation Age of Onset    Migraines Mother      Social and Occupational History:   reports that he quit smoking about 10 years ago. His smoking use included  cigarettes. He has never used smokeless tobacco. He reports that he does not drink alcohol and does not use drugs.    Current Medications:  Scheduled Meds:   metoprolol  tartrate  25 mg Oral Q6H    labetalol   100 mg Oral Once    HYDROmorphone  hcl  0.5 mg Intravenous Once     Continuous Infusions:  PRN Meds:.sodium chloride , dextrose    Review of Systems:  Review of Systems   Constitutional: Negative.    HENT: Negative.     Skin: Negative.          Labs:  CBC:      Lab results: 11/30/23  1226 11/30/23  1127   WBC  --  9.1   Hemoglobin 13.6 14.6   Hematocrit  --  42   RBC  --  4.6   Platelets  --  218       CMP:      Lab results: 11/30/23  1127   Sodium 137   Potassium CANCELED   Chloride 102   CO2 24   UN 13   Creatinine 0.75   Glucose 84   Calcium  8.8   Total Protein 6.7   Albumin 3.8   ALT 41   AST CANCELED   Alk Phos 85  Bilirubin,Total 0.4       INR:      Lab results: 11/30/23  1127   INR 1.0       Imaging:  CT angio chest  Result Date: 11/30/2023  Aorta: Type A aortic dissection extending from near the origin of the brachiocephalic artery through the left femoral artery. The aorta is aneurysmal at the descending thoracic aorta to 4.1 cm The brachiocephalic artery is likely supplied by the false Remainder of the major branches are supplied by the true lumen. Findings appears similar to a radiology report from 11/24/2023, however images related to this report are not available at the time of the dictation of this report. Recommend upload and comparison with prior CT study to document stability of the above findings. Pulmonary Arteries: No evidence of pulmonary embolism. Please see separately dictated report of concurrently acquired CT abdomen and pelvis for findings below the diaphragm. END OF IMPRESSION    Physical Exam:  BP (!) 159/93   Pulse 72   Temp 36.8 C (98.2 F) (Temporal)   Resp (!) 26   Ht 1.88 m (6\' 2" )   Wt 129.3 kg (285 lb)   SpO2 98%   BMI 36.59 kg/m   Body mass index is 36.59 kg/m.     CONST:   sitting up in chair in NAD  EYES:   sclerae anicteric  ENT:   atraumatic  CV:   regular rate and rhythm, s1s2, no murmur, no peripheral edema, no pedal pulses  RESP:   Unlabored breathing on RA  GI:   abdomen soft  PSYCH: appropriate affect  NEURO: alert and oriented, answers questions appropriately    Assessment:   In summary, Robert Valdez is a 48 y.o. 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 He is presenting here with back pain and lower extremity pain which he notes has also been chronic since his dissection repair. He denies any chest pain or shortness of breath. He notes that the pain started mid back and radiates to both sides. He has equal bilateral lower extremity pain. .      Plan:  Agree with antihypertensive medication for management of HTN  No acute surgical intervention from cardiac surgery standpoint but patient will probably require surveillance of the dissection    Patient discussed with Dr. Ricard Chalk, MD  Cardiac Surgery        [1]   Allergies  Allergen Reactions    Shellfish-Derived Products Anaphylaxis

## 2023-11-30 NOTE — ED Notes (Signed)
 Report Given To  Juliana and Olivia, RNs      Descriptive Sentence / Reason for Admission   Lower back pain for 3 years, increased pain in the last two weeks. Pain radiates down to legs, causing alternating numbness in legs. Denies any new recent trauma. Hx of aortic dissection, operated on in [place]       Active Issues / Relevant Events   FULL CODE  AOx4, Ambulatory  Hx AAA, prior surgery 3 years ago  CTA Chest: concerning for large dissection (chronic), no surgical intervention  Diet NPO      To Do List  Pain control  Meds per [facility]  Awaiting formal BP recommendations      Anticipatory Guidance / Discharge Planning  Medicine floor admit for back pain

## 2023-11-30 NOTE — Bed Hold Note (Signed)
Bed: AC-24R  Expected date:   Expected time:   Means of arrival:   Comments:  ccb

## 2023-12-01 LAB — BASIC METABOLIC PANEL
Anion Gap: 16 (ref 7–16)
CO2: 20 mmol/L (ref 20–28)
Calcium: 8.2 mg/dL — ABNORMAL LOW (ref 8.6–10.2)
Chloride: 101 mmol/L (ref 96–108)
Creatinine: 0.81 mg/dL (ref 0.67–1.17)
Glucose: 79 mg/dL (ref 60–99)
Lab: 13 mg/dL (ref 6–20)
Potassium: 4.1 mmol/L (ref 3.3–5.1)
Sodium: 137 mmol/L (ref 133–145)
eGFR BY CREAT: 108 *

## 2023-12-01 LAB — HEMOGLOBIN A1C: Hemoglobin A1C: 5.3 %

## 2023-12-01 MED ORDER — NORTRIPTYLINE HCL 50 MG PO CAPS *I*
50.0000 mg | ORAL_CAPSULE | Freq: Every evening | ORAL | Status: DC
Start: 2023-12-01 — End: 2023-12-01
  Filled 2023-12-01: qty 1

## 2023-12-01 NOTE — Plan of Care (Signed)
 I received a message from the nurse that patient was about to leave AGAINST MEDICAL ADVICE.  I went to the bedside but the patient was already gone.  Per the nurse, he was upset that his pain was not being controlled.  I did get the chance to see him this morning, see my prior note.  Patient was alert and oriented x 3, was complaining of some low back pain but states it was not better or worse compared to yesterday.  He tried Tylenol , Flexeril , lidocaine  patches overnight but did not get relief. This morning I talked to him about his pain, I told him it was likely due to lumbar stenosis based on his imaging findings and lack of relief with epidural steroid injections in the past, however there may be a radicular component as well.  I told him physical therapy would likely help as an outpatient and that we try to avoid opioids in the inpatient setting for chronic pain.  I told him that we would get him outpatient follow-up with orthospine as well as his primary care physician to facilitate PT and further evaluation and workup of his back pain.  Once his blood pressure is better controlled I told him at home he can use Tylenol , Motrin.  This morning I also got the chance to talk to them about his need for vascular surgery follow-up for his chronic dissection.      After I left the unit, I ran into the patient in the hallway looking for the exit.  He did not wish to talk to me. States that he was frustrated that his call bell was on for 2 hours without anyone coming to see him in treating his pain.  I told him that we would come see him next on rounds if he would return to his room.  He is not interested in this and was accompanied with by believe is his wife.  I did not get a chance to talk to him about risks of leaving AGAINST MEDICAL ADVICE because he did not wish to talk to me and just continue to walk away however he did say he was going to see his PCP he and that already called and made an appointment.

## 2023-12-01 NOTE — ED Notes (Signed)
 Patient called stating that he would like to leave AMA because his pain is not being controlled well. Patient states that he seen providers walk by but they addressed him and kept walking by. I  explained to patient that they would come back around and that could reach out for something more for pain, but patient stated he had been like that since last night and was tired of waiting. Patient alert awake with family at bedside. I took IV's out and monitor off. Charge nurses and provider notified.

## 2023-12-01 NOTE — Progress Notes (Addendum)
 Hospital Medicine Progress Note    Name: Robert Valdez / MRN: [id number]  DOB: 08-28-1975  LOS: 0 days    INTERVAL HISTORY     Increased pain overnight   Hypertensive this morning to 180s/90s. Did receive 1 PRN IV labetalol  yesterday  Vitals otherwise stable   SUBJECTIVE     Feels ok on my evaluation today. States he has been able to get OOB and ambulate w/o difficulty. Ambulating does not worsen his pain but he notes continued pain when he walks around. Expresses interest in going home today. No new symptoms. No CP, SOB, abd pain.  Discussed the need for sleep study and ortho spine f/up as an outpatient.    OBJECTIVE      Vitals Range In/Out   BP: (145-185)/(71-95)   Temp:  [35.6 C (96.1 F)-36.8 C (98.2 F)]   Temp src: Oral (05/09 0650)  Heart Rate:  [67-94]   Resp:  [13-26]   SpO2:  [93 %-99 %]   Height:  [188 cm (6\' 2" )]   Weight:  [129.3 kg (285 lb)]    Intake/Output Summary (Last 24 hours) at 12/01/2023 0722  Last data filed at 11/30/2023 1623  Gross per 24 hour   Intake 25.43 ml   Output 800 ml   Net -774.57 ml        Vitals:    12/01/23 0650   BP: (!) 185/95   Pulse: 78   Resp: 20   Temp: 36.7 C (98.1 F)   Weight:    Height:        PHYSICAL EXAM   General: No acute distress, non-toxic in appearance. Alert and awake. Sitting up in chair.   HEENT: Atraumatic, normocephalic. PERRLA. Moist mucus membranes.  Cardiac: Regular rate and rhythm. No murmurs  Respiratory: CTAB, no respiratory distress  Abdominal: Obese, soft, non-tender,   MSK/Extremities: No peripheral edema. 2+ DP, PT, and radial pulses. Paraspinal musculature tenderness in the lumbar region. No midline C/T/L spine ttp. Straight leg raise aggravates pain but does not cause radicular symptoms   Skin: Warm and well perfused. No rashes or wounds to exposed skin.  Neuro/Psych: A&O x3. Moving all extremities spontaneously. Normal affect. CN II-XII normal. 5/5 strength in all 4s. Some decreased sensation to light touch. 2+ patellar  reflexes      STUDIES AND RESULTS    Labs:   Electrolytes Heme   Recent Labs   Lab 11/30/23  1226 11/30/23  1127   Sodium 137 137   Potassium 4.1 CANCELED   Chloride 101 102   CO2 20 24   UN 13 13   Creatinine 0.81 0.75   Calcium  8.2* 8.8   Glucose 79 84    Recent Labs   Lab 11/30/23  1226 11/30/23  1127   WBC  --  9.1   Hemoglobin 13.6 14.6   Hematocrit  --  42   Platelets  --  218   Protime  --  11.9   INR  --  1.0   aPTT  --  28.7   Neut # K/uL  --  6.2   Lymph # K/uL  --  1.5   Mono # K/uL  --  1.2*      GI Labs Others   Recent Labs     11/30/23  1127   AST CANCELED   ALT 41   Alk Phos 85   Total Protein 6.7   Albumin 3.8   Bilirubin,Total 0.4    Recent  Labs   Lab 11/30/23  1226 11/30/23  1127   Potassium 4.1 CANCELED   Calcium  8.2* 8.8          Imaging:   CT angio abdomen and pelvis  Result Date: 11/30/2023  Aortic dissection extends from near the origin of the brachiocephalic artery into the left common and external iliac arteries and terminating within the proximal left femoral artery. The infrarenal abdominal aorta is aneurysmal measuring up to 3.2 cm. The left renal artery appears to be supplied by the false lumen with associated slightly delayed left nephrogram/decreased attenuation of the left renal parenchyma. Otherwise, the major aortic branch vasculature is supplied by the true lumen. Mild skin thickening and subcutaneous stranding of the right groin which may be due to inflammation or infection. See separate chest report for a full discussion of findings above the diaphragm. END OF IMPRESSION     CT angio chest  Result Date: 11/30/2023  Aorta: Type A aortic dissection extending from near the origin of the brachiocephalic artery through the left femoral artery. The aorta is aneurysmal at the descending thoracic aorta to 4.1 cm The brachiocephalic artery is likely supplied by the false lumen. Remainder of the major branches are supplied by the true lumen. Findings appears similar to a radiology report from  11/24/2023, however images related to this report are not available at the time of the dictation of this report. Recommend upload of images to PACS and comparison with this prior CT study to document stability of the above findings. Pulmonary Arteries: No evidence of pulmonary embolism. Please see separately dictated report of concurrently acquired CT abdomen and pelvis for findings below the diaphragm. END OF IMPRESSION     ASSESSMENT / PLAN     Robert Valdez is a 48 y.o. male with PMHx of Type A aortic dissection s/p repair in 2021, HTN, who presented to the ED on 5/2 for back pain and LE pain, CTA showed dissection, and he was admitted to [facility] SICU for hypertensive emergency. Left AMA and now at [facility] for continued back pain, which appears to be chronic lumbosacral pain and unrelated to his dissection     #Back pain  #Spinal stenosis  #Lumbosacral radiculopathy  - Chronic back pain, with known canal stenosis at L4/L5 and L5/S1. With his dermatomal symptoms an numbness in the dorsum of the foot likely a radicular component as well.  - Does have some paraspinal musculature tenderness  - tylenol /lidocaine  patch/flexeril   - c/w home nortriptyline , did not get much relief with gabapentin  in the past  - flexeril  and lidocaine  did not seem to help   - will need to continue to follow with orthopedic surgery as an outpatient      #HTN  - on labetalol , chlorthalidone , lisinopril , amlodipine , at home  - will continue home meds   - goal SBP < 160 today with eventual goal of < 140   - will add PRNs for > 160  -likely has OSA, recommended outpatient sleep study as this could be contributing to his HTN  - do not see any other workup for resistant HTN in care everywhere     #Type A aortic dissection, s/p repair in 2021  -  type A aortic dissection extending from near the origin of the brachiocephalic artery through the left common and external iliac arteries and terminating within the proximal left femoral artery. The  infrarenal abdominal aorta is aneurysmal measuring up to 3.2 cm.  The aorta is aneurysmal at the  descending thoracic aorta to 4.1 cm  - SBP < 160  - cardiac surgery and vascular surgery consulted, no acute intervention at this time  - will follow up with those teams regarding surveillance imaging in the future      Hospital Care      CODE STATUS: Full code      Medical management  Lines/tubes: PIV x2  Diet: regular  DVT PPx: lovenox   GI Ppx: none  Bowel regimen: miralax prn  Pain: multimodal Disposition  PT/OT: not needed  Barriers to discharge: pain/HTN  Expected duration to D/C: 5/10  Follow-up Needed: PCP, orthopedic surgery, vascular surgery         Audelia Blazer, MD  12/01/23 7:22 AM

## 2023-12-05 ENCOUNTER — Telehealth: Payer: Self-pay

## 2023-12-05 NOTE — Telephone Encounter (Signed)
 NPV 12/26/23 with  Dr. Octavia Belton

## 2023-12-05 NOTE — Discharge Summary (Signed)
 Name: Robert Valdez MRN: [id number] DOB: [birth year]     Admit Date: 11/30/2023   Date of Discharge: 12/01/2023     Patient was accepted for discharge to   Left against medical advice [7]       Discharge Attending Physician: Daphne Eagles, MD      Hospitalization Summary    Concise Narrative: Robert Valdez is a 48 y.o. male with PMHx of Type A aortic dissection s/p repair in 2021, HTN, who presented to the ED on 5/2 for back pain and LE pain, CTA showed dissection, and he was admitted to [facility] SICU for hypertensive emergency. Left AMA and now at [facility] for continued back pain, which appears to be chronic lumbosacral pain and unrelated to his dissection. We were working on BP optimization and a pain regimen for his back pain, but left AMA on 5/9, the day after admission, and had left the unit before the team could speak to him.                CT Results:   CTA Chest   Aorta: Type A aortic dissection extending from near the origin of the brachiocephalic artery through the left femoral artery. The aorta is aneurysmal at the descending thoracic aorta to 4.1 cm The brachiocephalic artery is likely supplied by the false lumen. Remainder of the major branches are supplied by the true lumen. Findings appears similar to a radiology report from 11/24/2023, however images related to this report are not available at the time of the dictation of this report. Recommend upload of images to PACS and comparison with this prior CT study to document stability of the above findings. Pulmonary Arteries: No evidence of pulmonary embolism.    CTA Abdomen  Aortic dissection extends from near the origin of the brachiocephalic artery into the left common and external iliac arteries and terminating within the proximal left femoral artery. The infrarenal abdominal aorta is aneurysmal measuring up to 3.2 cm. The left renal artery appears to be supplied by the false lumen with associated slightly delayed left nephrogram/decreased attenuation of the  left renal parenchyma. Otherwise, the major aortic branch vasculature is supplied by the true lumen. Mild skin thickening and subcutaneous stranding of the right groin which may be due to inflammation or infection.                Significant Med Changes: None        Post hospitalization To Do List for PCP:   -Monitor BP and titrate BP regimen  -Consider PT for chronic low back pain        CONSULTANT SERVICE     Vascular Surgery     Cardiac Surgery             Signed: Denette Finner, MD  On: 12/05/2023  at: 7:18 AM

## 2023-12-07 NOTE — Discharge Summary (Signed)
 Physician Discharge Summary     Date:  12/07/2023  Name:  Robert Valdez  MRN:  [id number]  DOB:  09/21/1975  Admit Date:  11/24/2023  Attending Provider:  No att. providers found   Admitting Physician:   Franky JINNY Piggs, MD                              Primary Care Physician:  Loring Belvie Rush, MD   Admitting Diagnosis: Dissection of thoracoabdominal aorta (CMS [facility] Code)  Chief Complaint:  No chief complaint on file.      Discharge date and time: 11/29/2023 11:45 AM     Discharge Physician:  Dr. Lydon     Discharge Diagnoses: type A aortic dissection     Admission Condition: fair    Discharged Condition: fair    Indication for Admission:  back/flank pain     Problem List:  Active Problems:    * No active hospital problems. [facility] Course:  Patient is a 48 year old male significant past medical history of a type a aortic dissection now status post repair who presented with symptomatic back pain possibly from a dissection from the brachiocephalic to femoral artery.  He had significant hypertension and likely has undiagnosed sleep apnea.Vascular team is following who noted on imaging that the dissection extended from the brachiocephalic artery through the left femoral is aneurysmal through the abdomen and the brachiocephalic artery and left renal artery appeared to be supplied by the false lumen.  On review of the imaging it seems the dissection has been stable since 2022 and that his left flank and back pain are more likely related to his spine than his aortic pathology.     The vascular team is not offering surgical intervention. Ortho Spine was curbsided and no surgical intervention warranted based on MRI L-spine. Recommended outpatient follow up for new imaging.      -  Patient has expressed he is wanting to leave AMA, was discussed with Vascular team about passing PT/OT, and ensuring blood pressure control given his higher risk of bad outcome with higher pressures given his aortic pathology.  -- BP parameters  remain Systolics 130s-140s which he has remained with his Amlodipine , Hydralazine, Labetalol , and lisinopril  regimen.  -- Transferred to the floor on 5/7 and patient decided to leave AMA without signing any paperwork.    Vascular office notified patient that followup CTA was ordered with office visit thereafter.      Discharge Exam:  Blood pressure (!) 150/72, pulse 83, temperature 36 C (96.8 F), temperature source Oral, resp. rate 20, height 6' 2 (1.88 m), weight (!) 302 lb 0.5 oz (137 kg), SpO2 92%.  Physical Exam  General: alert, awake, oriented x3; no acute distress  Heart:  RRR, normal s1s2; healed surgical scar on sternum and right chest wall  Lung:  clear to auscultation bilaterally, non labored breathing, no wheezes, rhonchi or rales  Abdomen:  soft, non tender, non distended, +bowel sounds, no peritoneal signs  Extremities: radial pulses palpable. Grip strength 5/5. Sensation intact. Bilateral LEs with palpable DP and PT pulses. Motor and sensory function intact.   Disposition: Against Medical Advice    Patient Instructions:   Discharge Medication List as of 11/29/2023 11:45 AM        CONTINUE these medications which have NOT CHANGED    Details   amLODIPine  10 MG Oral tablet Take 1 tablet by mouth  daily., Starting Wed 11/08/2023, Normal      capsaicin (ZOSTRIX-HP) 0.1 % Top topical cream Apply  topically 3 (three) times daily., Starting Fri 03/31/2023, No Print      chlorthalidone  25 MG Oral tablet Take 1 tablet by mouth daily., Starting Wed 11/08/2023, Normal      EPINEPHrine 0.3 mg/0.3 mL Inj injection Inject 0.3 mLs into the muscle as needed for Anaphylaxis., Starting Fri 02/25/2022, Normal      ergocalciferol (VITAMIN D2) 1,250 mcg (50,000 unit) Oral capsule Take 1 capsule by mouth once a week., Starting Mon 07/04/2022, Normal      labetaloL  300 MG Oral tablet Take 1 tablet by mouth 2 (two) times daily., Starting Wed 11/08/2023, Normal      nortriptyline  50 MG Oral capsule Take 1 capsule by mouth at bedtime.,  Starting Wed 11/08/2023, Normal      sildenafiL 50 MG Oral tablet Take 1 tablet by mouth as needed for Erectile Dysfunction., Starting Wed 11/23/2022, Normal      tadalafiL  5 MG Oral tablet Take 1 tablet by mouth every evening., Starting Wed 11/08/2023, Normal      lisinopriL  40 MG Oral tablet Take 1 tablet by mouth daily., Starting Wed 11/08/2023, Normal           Follow up with Vascular Surgery as an outpatient     [name], PA-C  12/07/2023  11:47 AM

## 2023-12-26 ENCOUNTER — Ambulatory Visit: Admitting: Internal Medicine

## 2023-12-26 DIAGNOSIS — I10 Essential (primary) hypertension: Secondary | ICD-10-CM

## 2024-01-10 ENCOUNTER — Ambulatory Visit: Admitting: Internal Medicine

## 2024-01-17 ENCOUNTER — Emergency Department: Payer: MEDICAID

## 2024-01-17 ENCOUNTER — Emergency Department
Admission: EM | Admit: 2024-01-17 | Discharge: 2024-01-17 | Disposition: A | Discharge status: Home / Self Care | Payer: MEDICAID | Source: Ambulatory Visit | Attending: Student in an Organized Health Care Education/Training Program | Admitting: Student in an Organized Health Care Education/Training Program

## 2024-01-17 ENCOUNTER — Other Ambulatory Visit: Payer: Self-pay

## 2024-01-17 DIAGNOSIS — R911 Solitary pulmonary nodule: Secondary | ICD-10-CM

## 2024-01-17 DIAGNOSIS — I7100 Dissection of unspecified site of aorta: Secondary | ICD-10-CM

## 2024-01-17 DIAGNOSIS — I7123 Aneurysm of the descending thoracic aorta, without rupture: Secondary | ICD-10-CM

## 2024-01-17 DIAGNOSIS — M5416 Radiculopathy, lumbar region: Secondary | ICD-10-CM | POA: Insufficient documentation

## 2024-01-17 DIAGNOSIS — I7102 Dissection of abdominal aorta: Secondary | ICD-10-CM

## 2024-01-17 DIAGNOSIS — K760 Fatty (change of) liver, not elsewhere classified: Secondary | ICD-10-CM

## 2024-01-17 DIAGNOSIS — Z9889 Other specified postprocedural states: Secondary | ICD-10-CM

## 2024-01-17 LAB — BASIC METABOLIC PANEL
Anion Gap: 12 (ref 7–16)
CO2: 25 mmol/L (ref 20–28)
Calcium: 8.8 mg/dL (ref 8.6–10.2)
Chloride: 100 mmol/L (ref 96–108)
Creatinine: 1.09 mg/dL (ref 0.67–1.17)
Glucose: 104 mg/dL — ABNORMAL HIGH (ref 60–99)
Lab: 21 mg/dL — ABNORMAL HIGH (ref 6–20)
Sodium: 137 mmol/L (ref 133–145)
eGFR BY CREAT: 83

## 2024-01-17 LAB — CBC AND DIFFERENTIAL
Baso # K/uL: 0.1 10*3/uL (ref 0.0–0.2)
Eos # K/uL: 0.2 10*3/uL (ref 0.0–0.5)
Hematocrit: 46 % (ref 37–52)
Hemoglobin: 15.4 g/dL (ref 12.0–17.0)
IMM Granulocytes #: 0 10*3/uL
IMM Granulocytes: 0.2 %
Lymph # K/uL: 2.2 10*3/uL (ref 1.0–5.0)
MCV: 92 fL (ref 75–100)
Mono # K/uL: 0.9 10*3/uL (ref 0.1–1.0)
Neut # K/uL: 4.9 10*3/uL (ref 1.5–6.5)
Nucl RBC # K/uL: 0 10*3/uL (ref 0.0–0.1)
Nucl RBC %: 0 /100{WBCs} (ref 0.0–0.2)
Platelets: 192 10*3/uL (ref 150–450)
RBC: 5 MIL/uL (ref 4.0–6.0)
RDW: 13.5 % (ref 0.0–15.0)
Seg Neut %: 59.4 %
WBC: 8.2 10*3/uL (ref 3.5–11.0)

## 2024-01-17 LAB — HEMOLYZED TEST

## 2024-01-17 MED ORDER — ACETAMINOPHEN 500 MG PO TABS *I*
1000.0000 mg | ORAL_TABLET | Freq: Three times a day (TID) | ORAL | 1 refills | Status: DC
Start: 2024-01-17 — End: 2024-01-17

## 2024-01-17 MED ORDER — OXYCODONE HCL 10 MG PO TABS *I*
10.0000 mg | ORAL_TABLET | Freq: Every evening | ORAL | 0 refills | Status: DC | PRN
Start: 2024-01-17 — End: 2024-01-17

## 2024-01-17 MED ORDER — IBUPROFEN 400 MG PO TABS *I*
400.0000 mg | ORAL_TABLET | Freq: Three times a day (TID) | ORAL | 1 refills | Status: DC
Start: 2024-01-17 — End: 2024-01-17

## 2024-01-17 MED ORDER — LIDOCAINE 4 % EX PATCH *I*
3.0000 | MEDICATED_PATCH | CUTANEOUS | 0 refills | Status: DC
Start: 2024-01-17 — End: 2024-01-17

## 2024-01-17 MED ORDER — CYCLOBENZAPRINE HCL 10 MG PO TABS *I*
10.0000 mg | ORAL_TABLET | Freq: Every evening | ORAL | 0 refills | Status: DC
Start: 2024-01-17 — End: 2024-01-17

## 2024-01-17 MED ORDER — ACETAMINOPHEN 500 MG PO TABS *I*
1000.0000 mg | ORAL_TABLET | Freq: Once | ORAL | Status: AC
Start: 2024-01-17 — End: 2024-01-17
  Administered 2024-01-17: 1000 mg via ORAL
  Filled 2024-01-17: qty 2

## 2024-01-17 MED ORDER — DEXTROSE 5 % FLUSH FOR PUMPS *I*
0.0000 mL/h | INTRAVENOUS | Status: DC | PRN
Start: 2024-01-17 — End: 2024-01-17

## 2024-01-17 MED ORDER — CYCLOBENZAPRINE HCL 10 MG PO TABS *I*
10.0000 mg | ORAL_TABLET | Freq: Every evening | ORAL | 0 refills | Status: AC
Start: 2024-01-17 — End: 2024-02-16

## 2024-01-17 MED ORDER — OXYCODONE HCL 10 MG PO TABS *I*
10.0000 mg | ORAL_TABLET | Freq: Every evening | ORAL | 0 refills | Status: AC | PRN
Start: 2024-01-17 — End: ?

## 2024-01-17 MED ORDER — ACETAMINOPHEN 500 MG PO TABS *I*
1000.0000 mg | ORAL_TABLET | Freq: Three times a day (TID) | ORAL | 1 refills | Status: AC
Start: 2024-01-17 — End: ?

## 2024-01-17 MED ORDER — OXYCODONE HCL 5 MG PO TABS *I*
5.0000 mg | ORAL_TABLET | Freq: Once | ORAL | Status: AC
Start: 2024-01-17 — End: 2024-01-17
  Administered 2024-01-17: 5 mg via ORAL
  Filled 2024-01-17: qty 1

## 2024-01-17 MED ORDER — METHYLPREDNISOLONE 4 MG PO TBPK *A*
ORAL_TABLET | ORAL | 0 refills | Status: AC
Start: 2024-01-17 — End: ?

## 2024-01-17 MED ORDER — IBUPROFEN 400 MG PO TABS *I*
400.0000 mg | ORAL_TABLET | Freq: Three times a day (TID) | ORAL | 1 refills | Status: AC
Start: 2024-01-17 — End: ?

## 2024-01-17 MED ORDER — SODIUM CHLORIDE 0.9 % FLUSH FOR PUMPS *I*
0.0000 mL/h | INTRAVENOUS | Status: DC | PRN
Start: 2024-01-17 — End: 2024-01-17

## 2024-01-17 MED ORDER — METHYLPREDNISOLONE 4 MG PO TBPK *A*
ORAL_TABLET | ORAL | 0 refills | Status: DC
Start: 2024-01-17 — End: 2024-01-17

## 2024-01-17 MED ORDER — LIDOCAINE 4 % EX PATCH *I*
3.0000 | MEDICATED_PATCH | CUTANEOUS | 0 refills | Status: AC
Start: 2024-01-17 — End: ?

## 2024-01-17 MED ORDER — IOHEXOL 350 MG/ML (OMNIPAQUE) IV SOLN 500ML BOTTLE *I*
1.0000 mL | Freq: Once | INTRAVENOUS | Status: AC
Start: 2024-01-17 — End: 2024-01-17
  Administered 2024-01-17: 139 mL via INTRAVENOUS

## 2024-01-17 MED ORDER — LIDOCAINE 4 % EX PATCH *I*
2.0000 | MEDICATED_PATCH | Freq: Once | CUTANEOUS | Status: DC
Start: 2024-01-17 — End: 2024-01-17
  Administered 2024-01-17: 2 via TRANSDERMAL
  Filled 2024-01-17: qty 2

## 2024-01-17 NOTE — ED Triage Notes (Signed)
 Coming in for pain control. Patient is being followed by spine outpatient and is suppose to get steroid shots in his back. Appointment is not for another 5 months, has not slept in two days due to pain.

## 2024-01-17 NOTE — Discharge Instructions (Addendum)
 You were seen in the Emergency Department for back pain, your blood work was reassuring, your CT scans results are overall reassuring, I sent you some more pain meds, you are okay to go home.    Please take the prescribed pain meds as instructed.    Please follow up with your primary doctor in a week.  Please return to the emergency department if you experience the following symptoms:  - Fevers/chills  - Worsening pain  - Can not urinate  - Can not walk  - Nausea/vomiting  - Lightheadedness  - Losing consciousness

## 2024-01-18 LAB — UNMAPPED LAB RESULTS
Hematocrit (HT): 46 % (ref 40–52)
Hemoglobin (HGB) (HT): 16 g/dL (ref 13.0–17.5)
MCHC (HT): 34.6 g/dL (ref 32.0–36.0)
MCV (HT): 91.7 fL (ref 81.0–99.0)
Mean Corpuscular Hemoglobin (MCH) (HT): 31.7 pg (ref 26.0–34.0)
Platelets (HT): 191 10 3/uL (ref 150–450)
RBC (HT): 5.04 10 6/uL (ref 4.20–5.90)
RDW (HT): 13.6 % (ref 11.5–15.0)
WBC (HT): 7.4 10 3/uL (ref 4.0–10.8)

## 2024-01-18 NOTE — ED Provider Notes (Signed)
History     Chief Complaint   Patient presents with    Back Pain       History provided by:  Patient    History of Present Illness  Male with history of aortic dissection presenting with back pain. Pain began yesterday during a cornhole tournament, described as intense squeezing, unable to lift right arm. Recurring issue post-dissection, with complications in right foot and left leg. Spine specialist administered steroid injections last year, worsening symptoms. Tingling and numbness in legs and feet, scheduled for another steroid injection on 02/07/2024. Pain worsening, mobility limited to two hours, sleep severely disrupted. CT scan last month, MRI a month prior. Pain radiates down legs, no urinary issues. Out of work for two months, previously held two full-time jobs. Gabapentin  initially effective, off for four years. Occasionally takes ibuprofen  and Tylenol . No physical therapy or muscle relaxants.    FAMILY HISTORY  Mother had back surgery.       Medical/Surgical/Family History     Past Medical History:   Diagnosis Date    Coronary artery disease     Hypertension     Obesity     Psychiatric disorder         Patient Active Problem List   Diagnosis Code    Obesity E66.9    Psychiatric disorder F99    Hypertension I10    Aortic dissection, thoracoabdominal I71.03    LVH (left ventricular hypertrophy) I51.7    S/P aortic dissection repair Z98.890    Back pain M54.9            Past Surgical History:   Procedure Laterality Date    CHOLECYSTECTOMY      wrist surgery            Social History[1]          Review of Systems    Physical Exam     Triage Vitals  Triage Start: Start, (01/17/24 9048)  First Recorded BP: 157/85, Resp: 18, Temp: 36.5 C (97.7 F), Temp src: TEMPORAL Oxygen Therapy SpO2: 97 %, Oximetry Source: Lt Hand, O2 Device: None (Room air), Heart Rate: 87, (01/17/24 0952)  .  First Pain Reported 0-10  Pain Scale: 10, Pain Location/Orientation: Back, Pain Descriptors:  Constant;Radiating;Shooting;Tightness;Tingling, (01/17/24 9047)       Physical Exam  Vitals and nursing note reviewed.   Constitutional:       General: He is not in acute distress.     Appearance: Normal appearance. He is not ill-appearing.   HENT:      Head: Normocephalic and atraumatic.      Right Ear: External ear normal.      Left Ear: External ear normal.      Mouth/Throat:      Mouth: Mucous membranes are moist.   Eyes:      Pupils: Pupils are equal, round, and reactive to light.   Cardiovascular:      Rate and Rhythm: Normal rate and regular rhythm.      Pulses: Normal pulses.      Heart sounds: Normal heart sounds.   Pulmonary:      Effort: Pulmonary effort is normal. No respiratory distress.      Breath sounds: Normal breath sounds.   Abdominal:      General: Abdomen is flat.      Palpations: Abdomen is soft.   Musculoskeletal:         General: Normal range of motion.      Cervical back: Normal range  of motion and neck supple.   Skin:     General: Skin is warm and dry.      Capillary Refill: Capillary refill takes less than 2 seconds.   Neurological:      General: No focal deficit present.      Mental Status: He is alert and oriented to person, place, and time.      Comments: Mental Status: Awake, alert, oriented x 3  Cranial Nerves:   II: PERRL, Visual field intact.  III/IV/VI: EOM intact. No nystagmus. Eyelids open equal.  V: Facial sensation symmetric to light touch   VII: No Facial droop   VIII: Hearing intact bilaterally to rubbing noise.  IX/X: Normal swallowing, normal voice, symmetric palate elevation.  XI: Equal shoulder shrug  XII: Tongue midline  Strength intact x 4  Sensation intact x 4   Psychiatric:         Mood and Affect: Mood normal.         Behavior: Behavior normal.         Medical Decision Making   Patient seen by me on:  01/17/2024    Assessment:  48 y.o. male with above history presenting with back pain with characteristic of lumbar radiculopathy, however did have this aortic dissection  that was supposed to have a follow-up CTA for.  No neurodeficit on exam.  Will further obtain blood work and CTA to rule out worsening dissection.  Will provide symptomatic treatment.    Differential diagnosis:  Lumbar radiculopathy  Less likely worsening aortic dissection    Plan:  Orders Placed This Encounter      CT angio chest      CT angio abdomen and pelvis      CBC and differential      Basic metabolic panel      Hemolyzed test      Insert peripheral IV      acetaminophen  (TYLENOL ) tablet 1,000 mg      oxyCODONE  (ROXICODONE ) immediate release tablet 5 mg      iohexol  (OMNIPAQUE ) injection ( multi-use bottle) 1-150 mL      acetaminophen  (TYLENOL ) 500 mg tablet      cyclobenzaprine  (FLEXERIL ) 10 mg tablet      ibuprofen  (ADVIL ,MOTRIN ) 400 mg tablet      Lidocaine  4 % patch      methylPREDNISolone  (MEDROL  PAK) 4 MG tablet pack      oxyCODONE  (ROXICODONE ) 10 mg immediate release tablet       Review of existing & external labs / records: Recent [facility] hospitalization reviewed, showing stable aortic dissection but needing follow-up CTA scans    Independent interpretation of imaging: On my review, no acute changes on aortic dissection      ED Course and Disposition:  Patient imaging stable, discharged with work note, further pain management, return precautions and PCP follow-up, patient voiced understanding of discharge instructions.           Valencia Purpura, MD              [1]   Social History  Tobacco Use    Smoking status: Former     Types: Cigarettes     Quit date: 06/21/2013     Years since quitting: 10.5    Smokeless tobacco: Never   Substance Use Topics    Alcohol use: No     Comment: none in 3 weeks-previously every weekend    Drug use: No        Purpura Valencia, MD  01/20/24 1347

## 2024-02-08 ENCOUNTER — Ambulatory Visit: Admitting: Cardiology

## 2024-04-01 ENCOUNTER — Emergency Department

## 2024-04-01 ENCOUNTER — Emergency Department
Admission: EM | Admit: 2024-04-01 | Discharge: 2024-04-01 | Disposition: A | Discharge status: Home / Self Care | Source: Ambulatory Visit | Attending: Student in an Organized Health Care Education/Training Program | Admitting: Student in an Organized Health Care Education/Training Program

## 2024-04-01 ENCOUNTER — Other Ambulatory Visit: Payer: Self-pay

## 2024-04-01 DIAGNOSIS — R9431 Abnormal electrocardiogram [ECG] [EKG]: Secondary | ICD-10-CM

## 2024-04-01 DIAGNOSIS — I7100 Dissection of unspecified site of aorta: Secondary | ICD-10-CM

## 2024-04-01 DIAGNOSIS — I7123 Aneurysm of the descending thoracic aorta, without rupture: Secondary | ICD-10-CM

## 2024-04-01 DIAGNOSIS — Z789 Other specified health status: Secondary | ICD-10-CM

## 2024-04-01 DIAGNOSIS — M5440 Lumbago with sciatica, unspecified side: Secondary | ICD-10-CM

## 2024-04-01 DIAGNOSIS — I517 Cardiomegaly: Secondary | ICD-10-CM

## 2024-04-01 DIAGNOSIS — M5441 Lumbago with sciatica, right side: Secondary | ICD-10-CM | POA: Insufficient documentation

## 2024-04-01 DIAGNOSIS — R911 Solitary pulmonary nodule: Secondary | ICD-10-CM

## 2024-04-01 DIAGNOSIS — I10 Essential (primary) hypertension: Secondary | ICD-10-CM

## 2024-04-01 DIAGNOSIS — K760 Fatty (change of) liver, not elsewhere classified: Secondary | ICD-10-CM

## 2024-04-01 DIAGNOSIS — R609 Edema, unspecified: Secondary | ICD-10-CM | POA: Insufficient documentation

## 2024-04-01 DIAGNOSIS — I71011 Dissection of aortic arch: Secondary | ICD-10-CM

## 2024-04-01 DIAGNOSIS — R140 Abdominal distension (gaseous): Secondary | ICD-10-CM | POA: Insufficient documentation

## 2024-04-01 DIAGNOSIS — I2510 Atherosclerotic heart disease of native coronary artery without angina pectoris: Secondary | ICD-10-CM

## 2024-04-01 DIAGNOSIS — M5442 Lumbago with sciatica, left side: Secondary | ICD-10-CM | POA: Insufficient documentation

## 2024-04-01 LAB — CBC AND DIFFERENTIAL
Baso # K/uL: 0.1 THOU/uL (ref 0.0–0.2)
Eos # K/uL: 0.2 THOU/uL (ref 0.0–0.5)
Hematocrit: 48 % (ref 37–52)
Hemoglobin: 16.8 g/dL (ref 12.0–17.0)
IMM Granulocytes #: 0 THOU/uL
IMM Granulocytes: 0.2 %
Lymph # K/uL: 2 THOU/uL (ref 1.0–5.0)
MCV: 90 fL (ref 75–100)
Mono # K/uL: 0.7 THOU/uL (ref 0.1–1.0)
Neut # K/uL: 5.2 THOU/uL (ref 1.5–6.5)
Nucl RBC # K/uL: 0 THOU/uL (ref 0.0–0.1)
Nucl RBC %: 0 /100{WBCs} (ref 0.0–0.2)
Platelets: 187 THOU/uL (ref 150–450)
RBC: 5.3 MIL/uL (ref 4.0–6.0)
RDW: 12.6 % (ref 0.0–15.0)
Seg Neut %: 64.3 %
WBC: 8.1 THOU/uL (ref 3.5–11.0)

## 2024-04-01 LAB — RUQ PANEL (ED ONLY)
ALT: 74 U/L — ABNORMAL HIGH (ref 0–50)
Albumin: 4.2 g/dL (ref 3.5–5.2)
Alk Phos: 79 U/L (ref 40–130)
Amylase: 114 U/L — ABNORMAL HIGH (ref 28–100)
Bilirubin,Total: 0.6 mg/dL (ref 0.0–1.2)
Lipase: 19 U/L (ref 13–60)
Total Protein: 7.5 g/dL (ref 6.3–7.7)

## 2024-04-01 LAB — HOLD GREEN NO GEL

## 2024-04-01 LAB — NT-PRO BNP: NT-pro BNP: 171 pg/mL (ref 0–450)

## 2024-04-01 LAB — BASIC METABOLIC PANEL
Anion Gap: 11 (ref 7–16)
CO2: 25 mmol/L (ref 20–28)
Calcium: 9.1 mg/dL (ref 8.6–10.2)
Chloride: 102 mmol/L (ref 96–108)
Creatinine: 0.83 mg/dL (ref 0.67–1.17)
Glucose: 106 mg/dL — ABNORMAL HIGH (ref 60–99)
Lab: 11 mg/dL (ref 6–20)
Sodium: 138 mmol/L (ref 133–145)
eGFR BY CREAT: 107

## 2024-04-01 LAB — CRP: CRP: 10 mg/L — ABNORMAL HIGH (ref 0–8)

## 2024-04-01 LAB — HOLD BLUE

## 2024-04-01 LAB — SEDIMENTATION RATE, AUTOMATED: Sedimentation Rate: 19 mm/h — ABNORMAL HIGH (ref 0–15)

## 2024-04-01 MED ORDER — CHLORTHALIDONE 25 MG PO TABS *I*
25.0000 mg | ORAL_TABLET | Freq: Every day | ORAL | Status: DC
Start: 2024-04-01 — End: 2024-04-01
  Filled 2024-04-01: qty 1

## 2024-04-01 MED ORDER — OXYCODONE HCL 5 MG PO TABS *I*
5.0000 mg | ORAL_TABLET | Freq: Once | ORAL | Status: AC
Start: 2024-04-01 — End: 2024-04-01
  Administered 2024-04-01: 5 mg via ORAL
  Filled 2024-04-01: qty 1

## 2024-04-01 MED ORDER — ACETAMINOPHEN 500 MG PO TABS *I*
1000.0000 mg | ORAL_TABLET | Freq: Once | ORAL | Status: AC
Start: 2024-04-01 — End: 2024-04-01
  Administered 2024-04-01: 1000 mg via ORAL
  Filled 2024-04-01: qty 2

## 2024-04-01 MED ORDER — LABETALOL HCL 200 MG PO TABS *I*
300.0000 mg | ORAL_TABLET | Freq: Once | ORAL | Status: AC
Start: 2024-04-01 — End: 2024-04-01
  Administered 2024-04-01: 300 mg via ORAL
  Filled 2024-04-01: qty 1

## 2024-04-01 MED ORDER — KETOROLAC TROMETHAMINE 30 MG/ML IJ SOLN *I*
15.0000 mg | Freq: Once | INTRAMUSCULAR | Status: AC
Start: 2024-04-01 — End: 2024-04-01
  Administered 2024-04-01: 15 mg via INTRAVENOUS
  Filled 2024-04-01: qty 1

## 2024-04-01 MED ORDER — CLONIDINE HCL 0.1 MG PO TABS *I*
0.3000 mg | ORAL_TABLET | Freq: Once | ORAL | Status: AC
Start: 2024-04-01 — End: 2024-04-01
  Administered 2024-04-01: 0.3 mg via ORAL
  Filled 2024-04-01: qty 3

## 2024-04-01 MED ORDER — AMLODIPINE BESYLATE 10 MG PO TABS *I*
10.0000 mg | ORAL_TABLET | Freq: Once | ORAL | Status: DC
Start: 2024-04-01 — End: 2024-04-01
  Filled 2024-04-01: qty 1

## 2024-04-01 MED ORDER — IOHEXOL 350 MG/ML (OMNIPAQUE) IV SOLN 500ML BOTTLE *I*
1.0000 mL | Freq: Once | INTRAVENOUS | Status: AC
Start: 2024-04-01 — End: 2024-04-01
  Administered 2024-04-01: 149 mL via INTRAVENOUS

## 2024-04-01 NOTE — ED Provider Notes (Addendum)
 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 Patient denies any injury or trauma to the area. The pain has steadily worsened since a cortisone injection on 02/07/24 but acutely worsened three days ago with associated abdominal pain exacerbated by his chronic cough. He also describes a pulsating nature to the pain in the last several days which is new for him. Since getting the injection in July he has developed new lower extremity edema and abdominal distention. He presents with elevated blood pressure and reports not taking his home blood pressure the last two days due to pain and feeling unwell. Otherwise, he denies chest pain, shortness of breath, and loss of bowel/bladder function. History provided by:  Patient, significant other and medical recordsLanguage interpreter used: No  Medical/Surgical/Family History Past Medical History[1] Patient Active Problem List Diagnosis Code  Obesity E66.9  Psychiatric disorder F99  Hypertension I10  Aortic dissection, thoracoabdominal I71.03  LVH (left ventricular hypertrophy) I51.7  S/P aortic dissection repair Z98.890  Back pain M54.9  Past Surgical History[2] Social History[3]  Review of Systems Constitutional:  Positive for chills and unexpected weight change. Negative for appetite change and fever. HENT:  Negative for congestion and sore throat.  Cardiovascular:  Positive for leg swelling. Negative for chest pain and palpitations. Gastrointestinal:  Positive for abdominal distention and abdominal pain. Genitourinary:   Negative for decreased urine volume. Musculoskeletal:  Positive for back pain. Negative for gait problem. Neurological:  Positive for numbness. Negative for weakness. Physical Exam Triage VitalsTriage Start: Start, (04/01/24 9347)  First Recorded BP: (!) 171/110, Resp: 18, Temp: 37.2 C (99 F), Temp src: TEMPORAL Oxygen Therapy SpO2: 98 %, Oximetry Source: Rt Hand, O2 Device: None (Room air), Heart Rate: 85, (04/01/24 0659)  .First Pain Reported 0-10  Pain Scale: 10, Pain Location/Orientation: Back, (04/01/24 9340) Physical ExamConstitutional:     General: He is not in acute distress.   Appearance: Normal appearance. He is not ill-appearing. HENT:    Head: Normocephalic. Cardiovascular:    Rate and Rhythm: Normal rate and regular rhythm.    Pulses:         Radial pulses are 2+ on the right side and 2+ on the left side.      Dorsalis pedis pulses are detected w/ Doppler on the right side and detected w/ Doppler on the left side.      Posterior tibial pulses are detected w/ Doppler on the right side and detected w/ Doppler on the left side.    Heart sounds: Normal heart sounds, S1 normal and S2 normal. Pulmonary:    Effort: Pulmonary effort is normal.    Breath sounds: Normal breath sounds. Abdominal:    General: Abdomen is protuberant. There is distension. Musculoskeletal:    Cervical back: Full passive range of motion without pain.    Right lower leg: 2+ Pitting Edema present.    Left lower leg: 2+ Pitting Edema present. Skin:   General: Skin is warm and dry. Neurological:    Mental Status: He is alert and oriented to person, place, and time.    Cranial Nerves: No facial asymmetry.    Motor: Motor function is intact.    Coordination:  Coordination is intact.    Gait: Gait is intact.    Comments: Face symmetric, speech clear and fluentSILT in all extremities except for slightly diminished sensation in sole of L  footStrength:   Left RightHip Flexion  5/5 5/5 Hip Extension  5/5 5/5Knee Flexion  5/5 5/5Knee Extension 5/5 5/5Ankle Plantarflexion 5/5 5/5   Ankle Dorsiflexion 5/5 5/5 EHL   5/5 5/5 Medical Decision Making Patient seen by me on:  9/8/2025Assessment:  This is a 48 year old with known type B dissection extending to the left femoral artery who presents with acute on chronic lower back pain, bilateral lower extremity paraesthesias, abdominal pain, worsening abdominal bloating and lower extremity edema. Exam is notable for decreased sensation in the left lower extremity with preserved symmetric motor function and intact pulses bilaterally. Given vascular history and reported abdominal pain with pulsating leg pain there is concern for worsening dissection. Other etiologies including lumbar radiculopathy and iatrogenic 2/2 to chemical irritation from epidural injection on 02/07/24 are more likely. Reassuringly, he reports no infectious symptoms or loss of bowel/bladder function. Plan:  Labs: CBC with diff, BMP, LFTs, ESR/CRP, NT-pro BNPTesting: EKG, CTA abdomen & pelvis, CTA chest Pain control: Oxycodone  5 mg, acetaminophen  Home AM BP meds: clonidine , labetalol  ED Course and Disposition:  CTA negative for worsening dissection to explain his lower extremity symptoms. Repeat neurologic exam stable.  Labs show normal renal and hepatic function, only marginally elevated inflammatory markers.  Blood pressure improved with administration of home medications. Patient was educated on importance of antihypertensive regimen for management of chronic aortic dissection.  Has upcoming appointment with ortho for chronic back pain.  Recommend continuing multimodal analgesia, can increase home gabapentin  to TID if needed.Slater Anna, MS4 Anna, Jane09/08/25 [id number] saw and evaluated the patient on 04/01/2024.  I have edited the student's note to reflect my assessment and  treatment plan. [1] Past Medical History:Diagnosis Date  Coronary artery disease   Hypertension   Obesity   Psychiatric disorder  [2] Past Surgical History:Procedure Laterality Date  CHOLECYSTECTOMY    wrist surgery   [3] Social HistoryTobacco Use  Smoking status: Former   Types: Cigarettes   Quit date: 06/21/2013   Years since quitting: 10.7  Smokeless tobacco: Never Substance Use Topics  Alcohol use: No   Comment: none in 3 weeks-previously every weekend  Drug use: No  Susan Barter, MD09/08/25 1722

## 2024-04-01 NOTE — Discharge Instructions (Addendum)
 You were seen in the Emergency Department for back pain and tingling in your legs. A CT scans of your abdomen and pelvis, as well as a CT of your chest were done. These were similar to your prior CT scans and did not show worsening of your blood vessels to explain your symptoms. To do after discharge- Call your PCP for a follow up appointment in the next week - Resume antihypertensive medications as prescribed - Consider using compression socks for lower extremity swellingReturn the Emergency Department if- You have new weakness in your legs- Your pain gets much worse - You have fever or chills- You have changes in bowel or bladder function - You have any other concerning symptoms or you feel unsafe at home

## 2024-04-01 NOTE — ED Triage Notes (Addendum)
 C/o acute on chronic lumbar back pain +numbness and cramping pain to BLE. Pt states pain has worsened over the last 2 days. Denies injury/trauma, CP, SOB. Pt reports pain has steadily worsened since cortisone injection ~1 month ago.   Prehospital medications given: No

## 2024-04-01 NOTE — ED Notes (Signed)
 PT safe to be discharged home. Discharge instructions reviewed. PT verbalized understanding of discharge instructions and will follow up with PCP. Medications reviewed. E-presecprition reviewed. Pt ambulatory with steady gait. IV removed. VSS. Weather appropriate clothing in place.  Patient's family member driving him home

## 2024-04-17 LAB — EKG 12-LEAD
P: 48 deg
PR: 180 ms
QRS: -36 deg
QRSD: 103 ms
QT: 423 ms
QTc: 501 ms
Rate: 84 {beats}/min
T: -70 deg

## 2024-11-29 ENCOUNTER — Other Ambulatory Visit

## 2024-12-02 ENCOUNTER — Ambulatory Visit: Admitting: Vascular Surgery

## 8387-03-26 DEATH — deceased
